# Patient Record
Sex: Female | Born: 1980
Health system: Southern US, Community
[De-identification: ages and names within clinical notes are randomized; demographics above are authoritative.]

## PROBLEM LIST (undated history)

## (undated) DIAGNOSIS — D509 Iron deficiency anemia, unspecified: Secondary | ICD-10-CM

## (undated) DIAGNOSIS — D649 Anemia, unspecified: Secondary | ICD-10-CM

## (undated) DIAGNOSIS — E559 Vitamin D deficiency, unspecified: Secondary | ICD-10-CM

## (undated) HISTORY — DX: Vitamin D deficiency, unspecified: E55.9

## (undated) HISTORY — DX: Iron deficiency anemia, unspecified: D50.9

## (undated) HISTORY — PX: ESSURE TUBAL LIGATION: SUR464

---

## 2004-03-10 ENCOUNTER — Emergency Department: Payer: Self-pay | Admitting: Emergency Medicine

## 2004-03-13 ENCOUNTER — Emergency Department: Payer: Self-pay | Admitting: Emergency Medicine

## 2004-05-06 ENCOUNTER — Observation Stay: Payer: Self-pay | Admitting: Obstetrics and Gynecology

## 2004-05-30 ENCOUNTER — Observation Stay: Payer: Self-pay

## 2004-06-17 ENCOUNTER — Observation Stay: Payer: Self-pay | Admitting: Obstetrics and Gynecology

## 2004-06-28 ENCOUNTER — Inpatient Hospital Stay: Payer: Self-pay

## 2004-08-23 ENCOUNTER — Emergency Department: Payer: Self-pay | Admitting: Emergency Medicine

## 2005-01-02 ENCOUNTER — Emergency Department: Payer: Self-pay | Admitting: Emergency Medicine

## 2005-03-14 ENCOUNTER — Emergency Department: Payer: Self-pay | Admitting: Unknown Physician Specialty

## 2005-11-13 ENCOUNTER — Emergency Department: Payer: Self-pay | Admitting: Emergency Medicine

## 2005-11-20 ENCOUNTER — Emergency Department: Payer: Self-pay | Admitting: General Practice

## 2005-11-22 ENCOUNTER — Emergency Department: Payer: Self-pay | Admitting: Emergency Medicine

## 2005-11-30 ENCOUNTER — Emergency Department: Payer: Self-pay | Admitting: Emergency Medicine

## 2005-12-06 ENCOUNTER — Inpatient Hospital Stay: Payer: Self-pay

## 2006-01-05 ENCOUNTER — Emergency Department: Payer: Self-pay | Admitting: Emergency Medicine

## 2006-04-04 ENCOUNTER — Observation Stay: Payer: Self-pay | Admitting: Unknown Physician Specialty

## 2006-05-15 ENCOUNTER — Observation Stay: Payer: Self-pay

## 2006-05-27 ENCOUNTER — Observation Stay: Payer: Self-pay

## 2006-05-30 ENCOUNTER — Observation Stay: Payer: Self-pay | Admitting: Obstetrics & Gynecology

## 2006-06-08 ENCOUNTER — Observation Stay: Payer: Self-pay

## 2006-06-16 ENCOUNTER — Inpatient Hospital Stay: Payer: Self-pay | Admitting: Obstetrics & Gynecology

## 2007-05-24 ENCOUNTER — Emergency Department: Payer: Self-pay | Admitting: Internal Medicine

## 2007-05-28 ENCOUNTER — Emergency Department: Payer: Self-pay | Admitting: Emergency Medicine

## 2008-01-08 ENCOUNTER — Emergency Department: Payer: Self-pay | Admitting: Emergency Medicine

## 2008-01-08 ENCOUNTER — Other Ambulatory Visit: Payer: Self-pay

## 2008-03-03 ENCOUNTER — Ambulatory Visit: Payer: Self-pay | Admitting: Advanced Practice Midwife

## 2008-03-26 ENCOUNTER — Observation Stay: Payer: Self-pay

## 2008-05-08 ENCOUNTER — Observation Stay: Payer: Self-pay | Admitting: Obstetrics & Gynecology

## 2008-06-02 ENCOUNTER — Observation Stay: Payer: Self-pay | Admitting: Obstetrics and Gynecology

## 2008-06-03 ENCOUNTER — Ambulatory Visit: Payer: Self-pay

## 2008-06-14 ENCOUNTER — Observation Stay: Payer: Self-pay | Admitting: Obstetrics & Gynecology

## 2008-06-16 ENCOUNTER — Observation Stay: Payer: Self-pay

## 2008-06-21 ENCOUNTER — Observation Stay: Payer: Self-pay | Admitting: Unknown Physician Specialty

## 2008-07-14 ENCOUNTER — Observation Stay: Payer: Self-pay

## 2008-07-18 ENCOUNTER — Observation Stay: Payer: Self-pay

## 2008-07-30 ENCOUNTER — Inpatient Hospital Stay: Payer: Self-pay

## 2009-02-10 ENCOUNTER — Ambulatory Visit: Payer: Self-pay | Admitting: Obstetrics & Gynecology

## 2009-08-11 IMAGING — US US OB US >=[ID] SNGL FETUS
1 series · 17 of 28 positions shown · non-contrast
Comparison: none

REASON FOR EXAM: SIZE AND DATES
COMMENTS:

[Series 1: us ob us >=(id) sngl fetus · 17 of 49 slices shown]
[im 1/49]
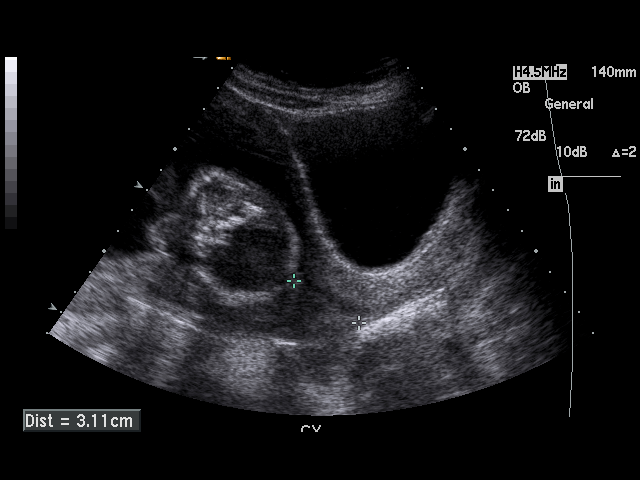
[im 4/49]
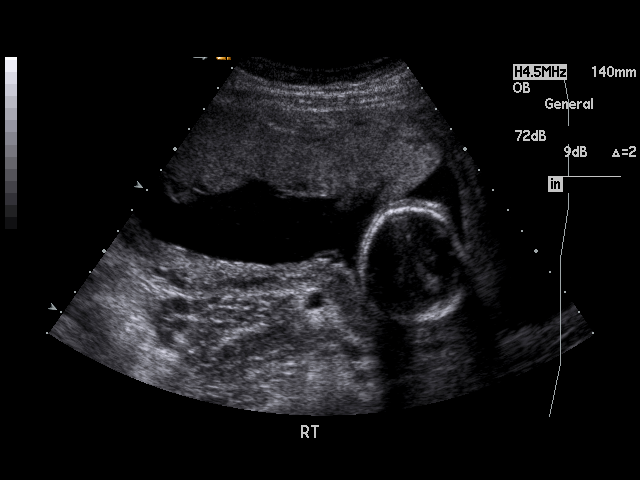
[im 8/49]
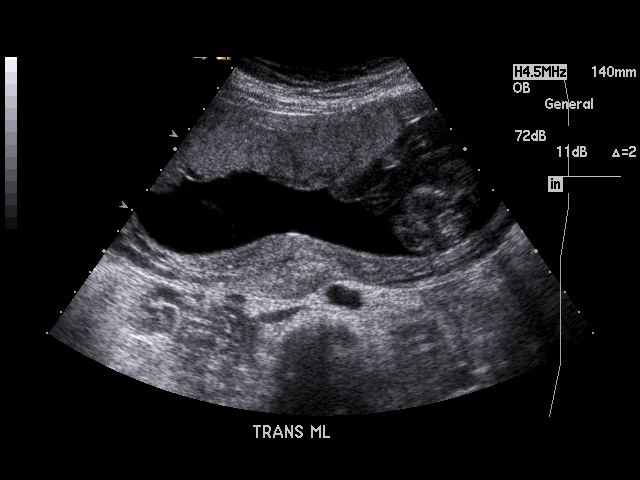
[im 9/49]
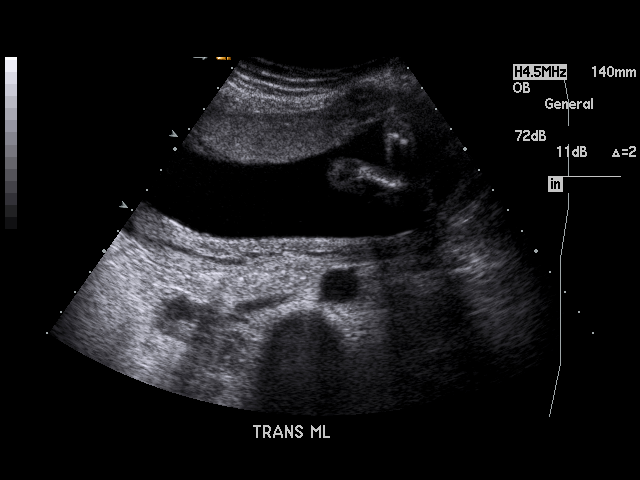
[im 13/49]
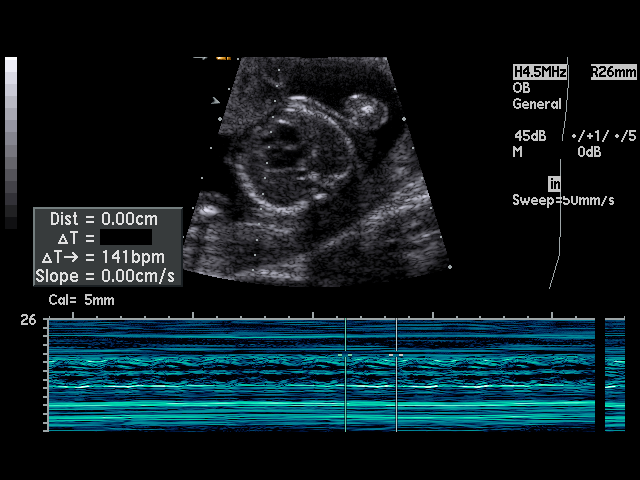
[im 17/49]
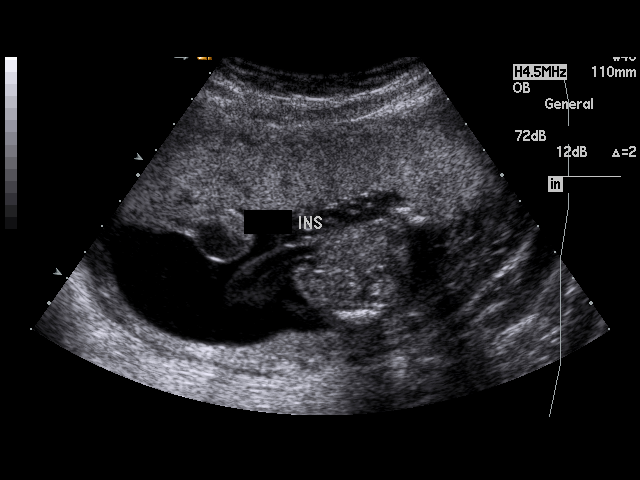
[im 18/49]
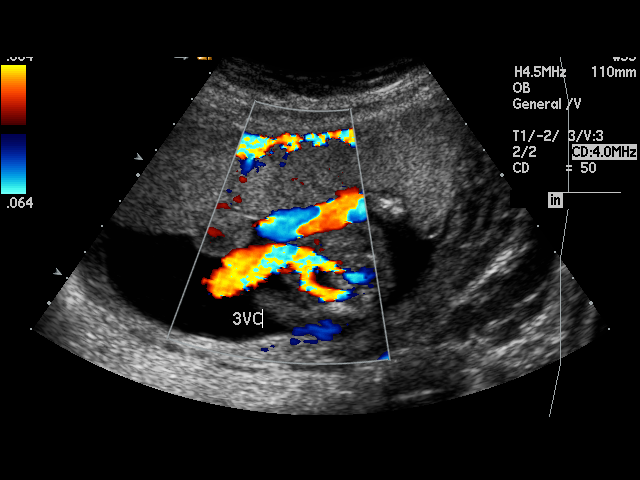
[im 22/49]
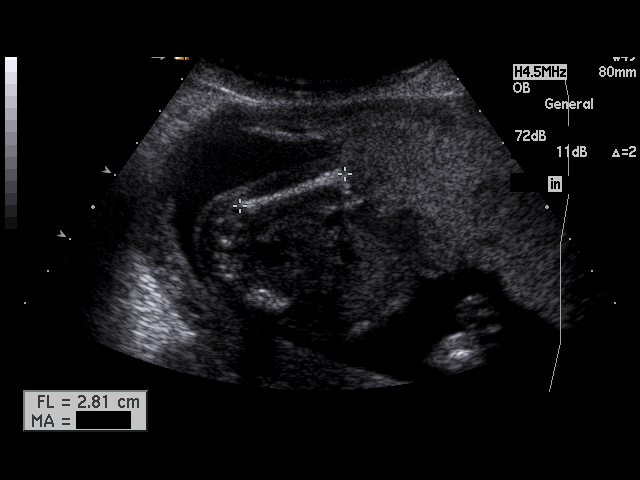
[im 25/49]
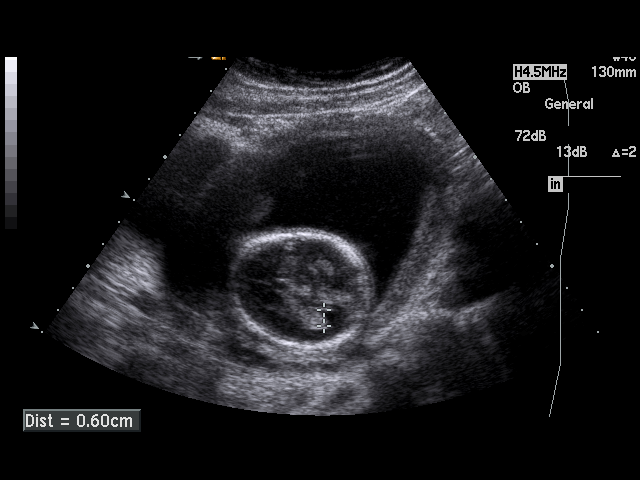
[im 27/49]
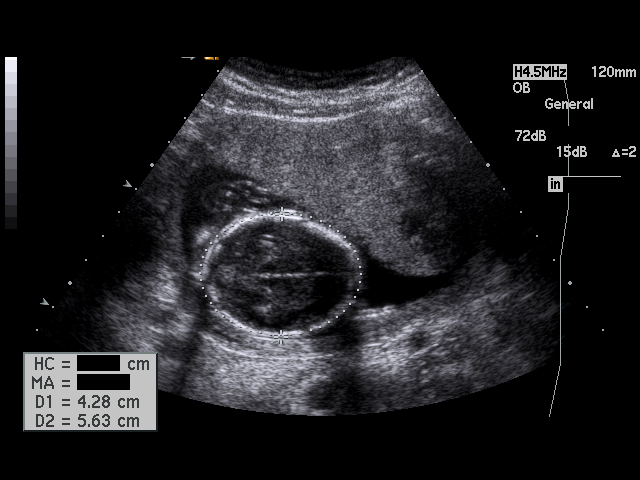
[im 31/49]
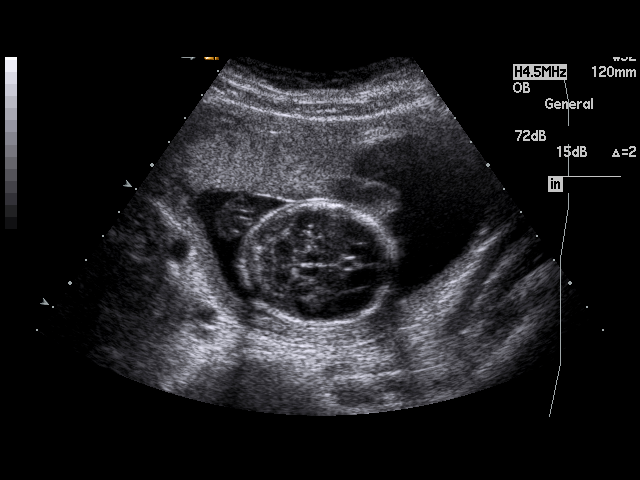
[im 33/49]
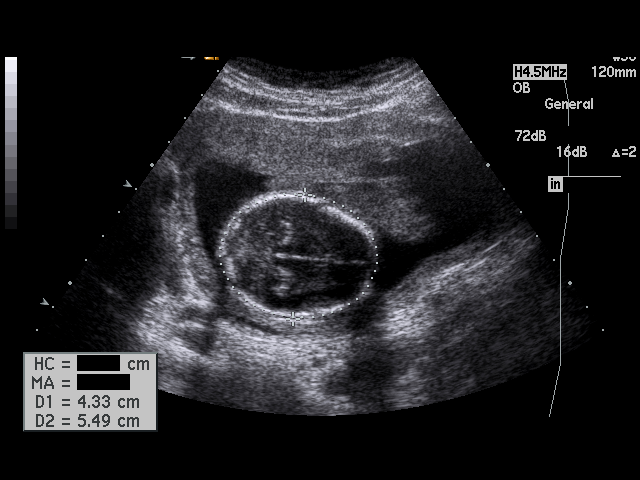
[im 36/49]
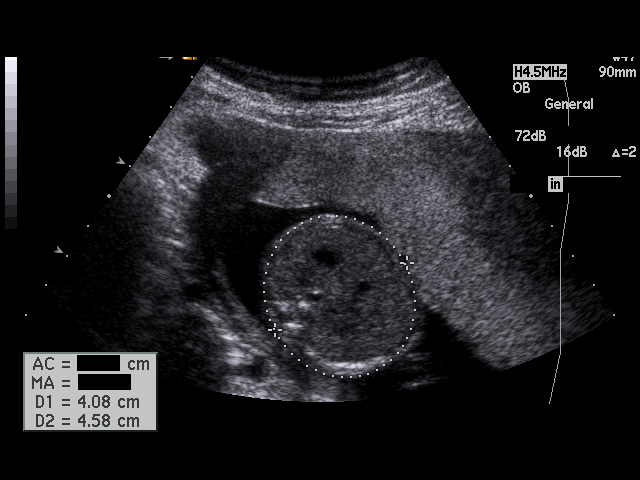
[im 40/49]
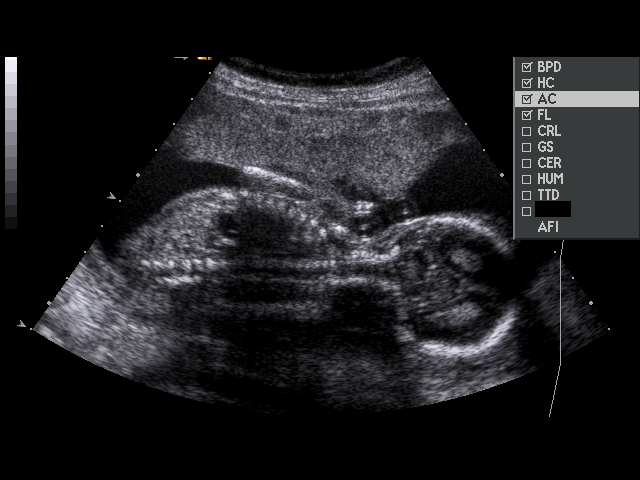
[im 41/49]
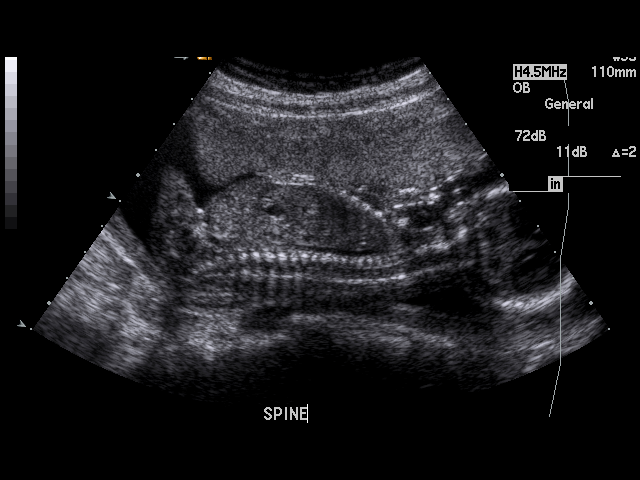
[im 45/49]
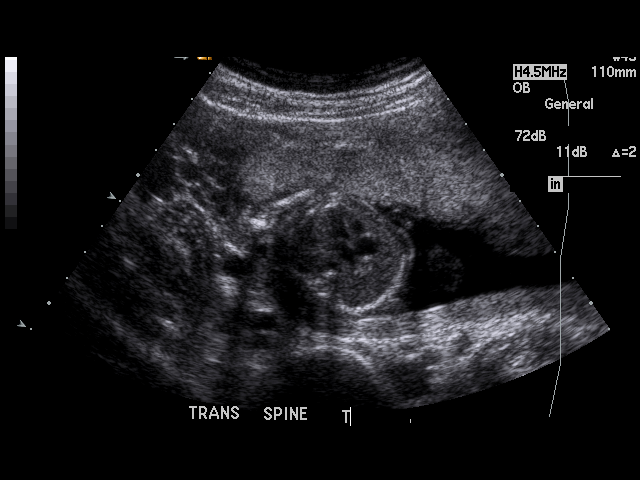
[im 49/49]
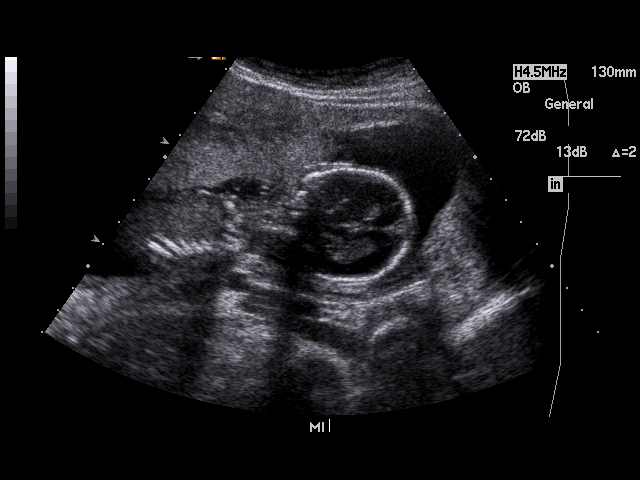

[17 of 28 positions shown; findings below may reference images not displayed]

PROCEDURE:     US  - US OB GREATER/OR EQUAL TO IB0CI  - March 03, 2008 [DATE]

RESULT:     Ultrasound of the pelvis is performed utilizing a routine OB
protocol. The study demonstrates a single intrauterine fetus with normal
cardiac, trunk, extremity and diaphragmatic motion. The amniotic fluid
volume is visually normal. The placenta is anterior in position and grade 1
in appearance. The fetal anatomy shows no gross abnormality although
visualization of the spine is somewhat limited. Gestational age based on
sonographic criteria is approximately 18 weeks-5 days with an estimated
delivery date of 07/30/2008. Estimated fetal weight is 283 grams + / - 38 grams.

The fetal cardiac activity is measured at approximately 141 beats per minute.
IMPRESSION: Single intrauterine gestation of approximately 18 weeks-5
days.

## 2010-09-20 ENCOUNTER — Emergency Department: Payer: Self-pay | Admitting: Emergency Medicine

## 2012-07-14 ENCOUNTER — Emergency Department: Payer: Self-pay | Admitting: Emergency Medicine

## 2012-07-15 LAB — URINALYSIS, COMPLETE
Bilirubin,UR: NEGATIVE
Blood: NEGATIVE
Glucose,UR: NEGATIVE mg/dL (ref 0–75)
Ph: 7 (ref 4.5–8.0)
Protein: 30
Squamous Epithelial: 2
WBC UR: 6 /HPF (ref 0–5)

## 2012-07-15 LAB — CBC
MCH: 21.2 pg — ABNORMAL LOW (ref 26.0–34.0)
MCHC: 31.4 g/dL — ABNORMAL LOW (ref 32.0–36.0)
MCV: 68 fL — ABNORMAL LOW (ref 80–100)
RBC: 5.71 10*6/uL — ABNORMAL HIGH (ref 3.80–5.20)
WBC: 9.2 10*3/uL (ref 3.6–11.0)

## 2012-07-15 LAB — COMPREHENSIVE METABOLIC PANEL
Alkaline Phosphatase: 54 U/L (ref 50–136)
Bilirubin,Total: 0.4 mg/dL (ref 0.2–1.0)
Chloride: 107 mmol/L (ref 98–107)
Co2: 25 mmol/L (ref 21–32)
Osmolality: 275 (ref 275–301)
Potassium: 4 mmol/L (ref 3.5–5.1)
SGOT(AST): 16 U/L (ref 15–37)
Total Protein: 8.7 g/dL — ABNORMAL HIGH (ref 6.4–8.2)

## 2012-07-15 LAB — RAPID INFLUENZA A&B ANTIGENS

## 2013-08-09 ENCOUNTER — Emergency Department: Payer: Self-pay | Admitting: Emergency Medicine

## 2013-08-09 LAB — CBC
HCT: 31.6 % — ABNORMAL LOW (ref 35.0–47.0)
HGB: 9.8 g/dL — AB (ref 12.0–16.0)
MCH: 18.5 pg — ABNORMAL LOW (ref 26.0–34.0)
MCHC: 31 g/dL — ABNORMAL LOW (ref 32.0–36.0)
MCV: 60 fL — ABNORMAL LOW (ref 80–100)
Platelet: 227 10*3/uL (ref 150–440)
RBC: 5.29 10*6/uL — ABNORMAL HIGH (ref 3.80–5.20)
RDW: 20.5 % — ABNORMAL HIGH (ref 11.5–14.5)
WBC: 7.1 10*3/uL (ref 3.6–11.0)

## 2013-08-09 LAB — URINALYSIS, COMPLETE
Bacteria: NONE SEEN
Bilirubin,UR: NEGATIVE
Glucose,UR: NEGATIVE mg/dL (ref 0–75)
KETONE: NEGATIVE
LEUKOCYTE ESTERASE: NEGATIVE
Nitrite: NEGATIVE
PROTEIN: NEGATIVE
Ph: 6 (ref 4.5–8.0)
RBC,UR: 56 /HPF (ref 0–5)
Specific Gravity: 1.027 (ref 1.003–1.030)

## 2013-08-09 LAB — GC/CHLAMYDIA PROBE AMP

## 2013-08-09 LAB — HCG, QUANTITATIVE, PREGNANCY: Beta Hcg, Quant.: 3874 m[IU]/mL — ABNORMAL HIGH

## 2013-08-09 LAB — WET PREP, GENITAL

## 2013-10-06 ENCOUNTER — Emergency Department: Payer: Self-pay | Admitting: Emergency Medicine

## 2013-10-06 LAB — URINALYSIS, COMPLETE
BILIRUBIN, UR: NEGATIVE
BLOOD: NEGATIVE
Bacteria: NONE SEEN
GLUCOSE, UR: NEGATIVE mg/dL (ref 0–75)
KETONE: NEGATIVE
Leukocyte Esterase: NEGATIVE
NITRITE: POSITIVE
PROTEIN: NEGATIVE
Ph: 6 (ref 4.5–8.0)
RBC,UR: NONE SEEN /HPF (ref 0–5)
SPECIFIC GRAVITY: 1.018 (ref 1.003–1.030)
Squamous Epithelial: 2

## 2013-10-06 LAB — COMPREHENSIVE METABOLIC PANEL
ALBUMIN: 2.9 g/dL — AB (ref 3.4–5.0)
ALK PHOS: 47 U/L
ALT: 11 U/L — AB (ref 12–78)
Anion Gap: 7 (ref 7–16)
BILIRUBIN TOTAL: 0.1 mg/dL — AB (ref 0.2–1.0)
BUN: 7 mg/dL (ref 7–18)
CALCIUM: 8.3 mg/dL — AB (ref 8.5–10.1)
CHLORIDE: 104 mmol/L (ref 98–107)
Co2: 24 mmol/L (ref 21–32)
Creatinine: 0.51 mg/dL — ABNORMAL LOW (ref 0.60–1.30)
EGFR (African American): >60
Glucose: 94 mg/dL (ref 65–99)
Osmolality: 268 (ref 275–301)
Potassium: 3.9 mmol/L (ref 3.5–5.1)
SGOT(AST): 12 U/L — ABNORMAL LOW (ref 15–37)
Sodium: 135 mmol/L — ABNORMAL LOW (ref 136–145)
Total Protein: 7.8 g/dL (ref 6.4–8.2)

## 2013-10-06 LAB — CBC WITH DIFFERENTIAL/PLATELET
Basophil #: 0 10*3/uL (ref 0.0–0.1)
Basophil %: 0.2 %
Eosinophil #: 0.1 10*3/uL (ref 0.0–0.7)
Eosinophil %: 1.2 %
HCT: 30.9 % — ABNORMAL LOW (ref 35.0–47.0)
HGB: 9.5 g/dL — ABNORMAL LOW (ref 12.0–16.0)
Lymphocyte #: 2.4 10*3/uL (ref 1.0–3.6)
Lymphocyte %: 27.2 %
MCH: 19.5 pg — ABNORMAL LOW (ref 26.0–34.0)
MCHC: 30.8 g/dL — ABNORMAL LOW (ref 32.0–36.0)
MCV: 63 fL — AB (ref 80–100)
MONO ABS: 0.5 x10 3/mm (ref 0.2–0.9)
Monocyte %: 5.4 %
Neutrophil #: 5.7 10*3/uL (ref 1.4–6.5)
Neutrophil %: 66 %
Platelet: 149 10*3/uL — ABNORMAL LOW (ref 150–440)
RBC: 4.89 10*6/uL (ref 3.80–5.20)
RDW: 21.7 % — ABNORMAL HIGH (ref 11.5–14.5)
WBC: 8.6 10*3/uL (ref 3.6–11.0)

## 2014-03-04 ENCOUNTER — Observation Stay: Payer: Self-pay | Admitting: Obstetrics and Gynecology

## 2014-03-04 LAB — CBC WITH DIFFERENTIAL/PLATELET
Basophil #: 0 10*3/uL (ref 0.0–0.1)
Basophil %: 0.4 %
EOS PCT: 1.3 %
Eosinophil #: 0.1 10*3/uL (ref 0.0–0.7)
HCT: 29.7 % — AB (ref 35.0–47.0)
HGB: 9.2 g/dL — ABNORMAL LOW (ref 12.0–16.0)
LYMPHS PCT: 18.3 %
Lymphocyte #: 1.4 10*3/uL (ref 1.0–3.6)
MCH: 21.2 pg — AB (ref 26.0–34.0)
MCHC: 31 g/dL — AB (ref 32.0–36.0)
MCV: 68 fL — ABNORMAL LOW (ref 80–100)
Monocyte #: 0.5 x10 3/mm (ref 0.2–0.9)
Monocyte %: 7.1 %
NEUTROS PCT: 72.9 %
Neutrophil #: 5.5 10*3/uL (ref 1.4–6.5)
Platelet: 131 10*3/uL — ABNORMAL LOW (ref 150–440)
RBC: 4.35 10*6/uL (ref 3.80–5.20)
RDW: 19.2 % — ABNORMAL HIGH (ref 11.5–14.5)
WBC: 7.5 10*3/uL (ref 3.6–11.0)

## 2014-04-04 ENCOUNTER — Observation Stay: Payer: Self-pay

## 2014-04-05 ENCOUNTER — Observation Stay: Payer: Self-pay | Admitting: *Deleted

## 2014-04-08 ENCOUNTER — Observation Stay: Payer: Self-pay | Admitting: Obstetrics and Gynecology

## 2014-04-13 ENCOUNTER — Inpatient Hospital Stay: Payer: Self-pay

## 2014-04-13 LAB — CBC WITH DIFFERENTIAL/PLATELET
BASOS ABS: 0 10*3/uL (ref 0.0–0.1)
Basophil %: 0.4 %
Eosinophil #: 0.1 10*3/uL (ref 0.0–0.7)
Eosinophil %: 0.9 %
HCT: 32 % — ABNORMAL LOW (ref 35.0–47.0)
HGB: 10.2 g/dL — AB (ref 12.0–16.0)
Lymphocyte #: 1.4 10*3/uL (ref 1.0–3.6)
Lymphocyte %: 22.1 %
MCH: 22 pg — ABNORMAL LOW (ref 26.0–34.0)
MCHC: 31.7 g/dL — ABNORMAL LOW (ref 32.0–36.0)
MCV: 69 fL — ABNORMAL LOW (ref 80–100)
MONO ABS: 0.4 x10 3/mm (ref 0.2–0.9)
MONOS PCT: 6.3 %
Neutrophil #: 4.4 10*3/uL (ref 1.4–6.5)
Neutrophil %: 70.3 %
Platelet: 134 10*3/uL — ABNORMAL LOW (ref 150–440)
RBC: 4.61 10*6/uL (ref 3.80–5.20)
RDW: 20.5 % — ABNORMAL HIGH (ref 11.5–14.5)
WBC: 6.2 10*3/uL (ref 3.6–11.0)

## 2014-04-15 LAB — HEMATOCRIT: HCT: 24 % — AB (ref 35.0–47.0)

## 2014-07-23 ENCOUNTER — Emergency Department: Payer: Self-pay | Admitting: Emergency Medicine

## 2014-10-04 NOTE — H&P (Signed)
L&D Evaluation:  History Expanded:  HPI Pt is a 34 yo [id number] at 39+ weeks GA with an EDC of 04/09/14 who presents to L&D with reports of contractions that have been on and off all day long. She reports +FM, denies vb or lof. Her history is significant for anemia, AMA< smoker, thrombocytopenia which is stable, depression, essure on the left tube only due to right sided blockage, and a shoulder dystocia with her last baby that required no maneuvers for a 8#4oz baby. She is B+, RI, VI, GBS positive. She would like a BTL PP.   Presents with contractions   Patient's Medical History anemia, depression   Patient's Surgical History essure placement,   Medications Pre Natal Vitamins  Iron   Allergies NKDA   Social History tobacco   Family History Non-Contributory   ROS:  ROS All systems were reviewed.  HEENT, CNS, GI, GU, Respiratory, CV, Renal and Musculoskeletal systems were found to be normal.   Exam:  Vital Signs stable   General no apparent distress   Mental Status clear   Abdomen gravid, tender with contractions   Pelvic no external lesions, 3/80/-1   Mebranes Intact   FHT normal rate with no decels, 145, moderate variability, +accels,  no decels Cat 1 FHT   Ucx irregular, 9-17 minutes   Skin dry, no lesions, no rashes   Lymph no lymphadenopathy  other   Impression:  Impression reactive NST, IUP at 39+, cat 1 FHT, possible prodromal labor, NO CHANGE FROM YESTERDAYS ADMISSION   Plan:  Plan EFM/NST, monitor contractions and for cervical change, fluids, discharge   Comments rest   Follow Up Appointment already scheduled   Electronic Signatures: Letitia LibraHarris, Jamin Humphries Paul (MD)  (Signed 10-Nov-15 17:16)  Authored: L&D Evaluation   Last Updated: 10-Nov-15 17:16 by Letitia LibraHarris, Chett Taniguchi Paul (MD)

## 2014-10-04 NOTE — H&P (Signed)
L&D Evaluation:  History:  HPI Pt is a 34 yo [id number] at 39.[redacted] weeks GA with an EDC of 04/09/14 who presents to L&D with reports of contractions that have been on and off all day long. She reports +FM, denies vb or lof. Her history is significant for anemia, AMA< smoker, thrombocytopenia which is stable, depression, essure on the left tube only due to right sided blockage, and a shoulder dystocia with her last baby that required no maneuvers for a 8#4oz baby. She is B+, RI, VI, GBS positive. She would like a BTL PP.   Presents with contractions   Patient's Medical History anemia, depression   Patient's Surgical History essure placement,   Medications Pre Natal Vitamins  Iron   Allergies NKDA   Social History tobacco   Family History Non-Contributory   ROS:  ROS All systems were reviewed.  HEENT, CNS, GI, GU, Respiratory, CV, Renal and Musculoskeletal systems were found to be normal.   Exam:  Vital Signs stable   General no apparent distress   Mental Status clear   Abdomen gravid, tender with contractions   Pelvic no external lesions, 3/80/-1   Mebranes Intact   FHT normal rate with no decels, 145, moderate variability, +accels,  no decels Cat 1 FHT   Ucx irregular, 9-17 minutes   Skin dry, no lesions, no rashes   Lymph no lymphadenopathy  other   Impression:  Impression reactive NST, IUP at 39.2, cat 1 FHT, possible prodromal labor   Plan:  Plan discharge, pt offered therapeutic rest for contractions and accepts. Discussed FKC and labor precautions.   Follow Up Appointment already scheduled   Electronic Signatures: Jannet MantisSubudhi, Sydney Azure (CNM)  (Signed [phone]-Nov-15 23:16)  Authored: L&D Evaluation   Last Updated: 09-Nov-15 23:16 by Jannet MantisSubudhi, Sonal Dorwart (CNM)

## 2014-10-04 NOTE — H&P (Signed)
L&D Evaluation:  History:  HPI 34 year old [id number] [facility]72[facility]d [facility] after near syncopal event.  Patient was sitting and stood up quickly when she began feeling dizzy, diaphorectic, and felt her heart racing.  Symptoms resolved after sitting back down.  +FM, no LOF, no VB, no ctx.  Patient was seen yesterday in clinic and has reactive NST/AFI 7cm.  Patient anemic going into pregnancy, 8.8 at 28 week labs, taking prenatal gummies and OTC iron   Presents with dizzyness   Patient's Medical History anemia, thrombocytopenia, depression   Patient's Surgical History essure   Medications Pre Natal Vitamins  Iron   Allergies NKDA   Social History none   Family History Non-Contributory   ROS:  ROS All systems were reviewed.  HEENT, CNS, GI, GU, Respiratory, CV, Renal and Musculoskeletal systems were found to be normal.   Exam:  Vital Signs stable  106/63,   Urine Protein not completed   General no apparent distress   Mental Status clear   Abdomen gravid, non-tender   Estimated Fetal Weight Average for gestational age   Fetal Position vtx   Edema no edema   FHT normal rate with no decels, 140, moderate, positive accels, no decels   Ucx absent   Impression:  Impression 34 yo [id number] at 6472w5d with vasovagal episode   Plan:  Comments 1) Vasovagal episode - discussed etiology and precipitating events.  Given Rx ferrous sulfate 325mg  po bid.  Also given knee high ted hose - repeat CBC patient discharged prior to results has prior engagement told ok given has follow up in place 10/14  2) Fetus category I tracing  3) Disposition - discharge home   Follow Up Appointment already scheduled   Electronic Signatures: Lorrene ReidStaebler, Aveer Bartow M (MD)  (Signed 09-Oct-15 17:01)  Authored: L&D Evaluation   Last Updated: 09-Oct-15 17:01 by Lorrene ReidStaebler, Ivon Roedel M (MD)

## 2015-05-22 ENCOUNTER — Encounter (HOSPITAL_COMMUNITY): Payer: Self-pay | Admitting: *Deleted

## 2015-05-22 ENCOUNTER — Emergency Department (HOSPITAL_COMMUNITY): Payer: BLUE CROSS/BLUE SHIELD

## 2015-05-22 ENCOUNTER — Emergency Department (HOSPITAL_COMMUNITY)
Admission: EM | Admit: 2015-05-22 | Discharge: 2015-05-22 | Disposition: A | Discharge status: Home / Self Care | Payer: BLUE CROSS/BLUE SHIELD | Attending: Emergency Medicine | Admitting: Emergency Medicine

## 2015-05-22 DIAGNOSIS — R091 Pleurisy: Secondary | ICD-10-CM | POA: Insufficient documentation

## 2015-05-22 DIAGNOSIS — R079 Chest pain, unspecified: Secondary | ICD-10-CM | POA: Diagnosis present

## 2015-05-22 LAB — BASIC METABOLIC PANEL
Anion gap: 6 (ref 5–15)
BUN: 10 mg/dL (ref 6–20)
CALCIUM: 8.9 mg/dL (ref 8.9–10.3)
CHLORIDE: 110 mmol/L (ref 101–111)
CO2: 24 mmol/L (ref 22–32)
CREATININE: 0.68 mg/dL (ref 0.44–1.00)
GFR calc Af Amer: >60 mL/min (ref >60)
GFR calc non Af Amer: >60 mL/min (ref >60)
GLUCOSE: 125 mg/dL — AB (ref 65–99)
Potassium: 3.7 mmol/L (ref 3.5–5.1)
Sodium: 140 mmol/L (ref 135–145)

## 2015-05-22 LAB — CBC
HCT: 32.9 % — ABNORMAL LOW (ref 36.0–46.0)
HEMOGLOBIN: 9.8 g/dL — AB (ref 12.0–15.0)
MCH: 19.8 pg — ABNORMAL LOW (ref 26.0–34.0)
MCHC: 29.8 g/dL — AB (ref 30.0–36.0)
MCV: 66.5 fL — ABNORMAL LOW (ref 78.0–100.0)
Platelets: 216 10*3/uL (ref 150–400)
RBC: 4.95 MIL/uL (ref 3.87–5.11)
RDW: 15.9 % — AB (ref 11.5–15.5)
WBC: 5.7 10*3/uL (ref 4.0–10.5)

## 2015-05-22 LAB — I-STAT TROPONIN, ED: Troponin i, poc: 0 ng/mL (ref 0.00–0.08)

## 2015-05-22 MED ORDER — IBUPROFEN 800 MG PO TABS: 800.0000 mg | ORAL_TABLET | Freq: Three times a day (TID) | ORAL | Status: DC

## 2015-05-22 NOTE — ED Notes (Signed)
Pt reports chest pain with deep breathing. Pt states hat she has recently had cough/congestion.

## 2015-05-22 NOTE — ED Provider Notes (Signed)
CSN: [id number]     Arrival date & time 05/22/15  1439 History   First MD Initiated Contact with Patient 05/22/15 2011     Chief Complaint  Patient presents with  . Chest Pain     (Consider location/radiation/quality/duration/timing/severity/associated sxs/prior Treatment) HPI  34 y/o female - recent upper respiratory infection, no other significant chronic past medical problems, states that she has had several days of pain in the chest with deep breathing, only an occasional cough and no fevers. She has no swelling of the legs, she has a child that was just diagnosed with pneumonia, she is worried that she has the same.  History reviewed. No pertinent past medical history. Past Surgical History  Procedure Laterality Date  . Essure tubal ligation     No family history on file. Social History  Substance Use Topics  . Smoking status: Never Smoker   . Smokeless tobacco: None  . Alcohol Use: None   OB History    No data available     Review of Systems  All other systems reviewed and are negative.     Allergies  Review of patient's allergies indicates no known allergies.  Home Medications   Prior to Admission medications   Medication Sig Start Date End Date Taking? Authorizing Provider  ibuprofen (ADVIL,MOTRIN) 800 MG tablet Take 1 tablet (800 mg total) by mouth 3 (three) times daily. 05/22/15   Eber Hong, MD   BP 119/67 mmHg  Pulse 85  Temp(Src) 98.4 F (36.9 C) (Oral)  Resp 20  SpO2 100%  LMP 05/01/2015 Physical Exam  Constitutional: She appears well-developed and well-nourished. No distress.  HENT:  Head: Normocephalic and atraumatic.  Mouth/Throat: Oropharynx is clear and moist. No oropharyngeal exudate.  Eyes: Conjunctivae and EOM are normal. Pupils are equal, round, and reactive to light. Right eye exhibits no discharge. Left eye exhibits no discharge. No scleral icterus.  Neck: Normal range of motion. Neck supple. No JVD present. No thyromegaly present.   Cardiovascular: Normal rate, regular rhythm, normal heart sounds and intact distal pulses.  Exam reveals no gallop and no friction rub.   No murmur heard. Pulmonary/Chest: Effort normal and breath sounds normal. No respiratory distress. She has no wheezes. She has no rales.  Lungs sounds normal, speaks in full sentences, no distress or increased work of breathing  Abdominal: Soft. Bowel sounds are normal. She exhibits no distension and no mass. There is no tenderness.  Musculoskeletal: Normal range of motion. She exhibits no edema or tenderness.  Lymphadenopathy:    She has no cervical adenopathy.  Neurological: She is alert. Coordination normal.  Skin: Skin is warm and dry. No rash noted. No erythema.  Psychiatric: She has a normal mood and affect. Her behavior is normal.  Nursing note and vitals reviewed.   ED Course  Procedures (including critical care time) Labs Review Labs Reviewed  BASIC METABOLIC PANEL - Abnormal; Notable for the following:    Glucose, Bld 125 (*)    All other components within normal limits  CBC - Abnormal; Notable for the following:    Hemoglobin 9.8 (*)    HCT 32.9 (*)    MCV 66.5 (*)    MCH 19.8 (*)    MCHC 29.8 (*)    RDW 15.9 (*)    All other components within normal limits  Rosezena Sensor, ED    Imaging Review Dg Chest 2 View  05/22/2015  CLINICAL DATA:  34 year old female with chest pain on deep inspiration EXAM: CHEST  2 VIEW COMPARISON:  None. FINDINGS: The lungs are clear and negative for focal airspace consolidation, pulmonary edema or suspicious pulmonary nodule. No pleural effusion or pneumothorax. Cardiac and mediastinal contours are within normal limits. No acute fracture or lytic or blastic osseous lesions. The visualized upper abdominal bowel gas pattern is unremarkable. IMPRESSION: Normal chest x-ray. Electronically Signed   By: Malachy MoanHeath  McCullough M.D.   On: 05/22/2015 15:33   I have personally reviewed and evaluated these images and  lab results as part of my medical decision-making.   EKG Interpretation   Date/Time:  Monday May 22 2015 14:49:16 EST Ventricular Rate:  83 PR Interval:  130 QRS Duration: 86 QT Interval:  360 QTC Calculation: 423 R Axis:   72 Text Interpretation:  Normal sinus rhythm with sinus arrhythmia Normal ECG  since last tracing no significant change Confirmed by Aritzel Krusemark  MD, Cataleia Gade  [id number]) on 05/22/2015 8:16:25 PM      MDM   Final diagnoses:  Pleurisy    Vitals unremarkable, EKG normal, chest x-ray unremarkable as well, likely pleurisy, stable for discharge on anti-inflammatories. Patient informed of the results, she is in agreement.  Meds given in ED:  Medications - No data to display  New Prescriptions   IBUPROFEN (ADVIL,MOTRIN) 800 MG TABLET    Take 1 tablet (800 mg total) by mouth 3 (three) times daily.        Eber HongBrian Greyden Besecker, MD 05/22/15 2021

## 2016-01-12 DIAGNOSIS — Z13228 Encounter for screening for other metabolic disorders: Secondary | ICD-10-CM | POA: Diagnosis not present

## 2016-01-12 DIAGNOSIS — F329 Major depressive disorder, single episode, unspecified: Secondary | ICD-10-CM | POA: Diagnosis not present

## 2016-01-12 DIAGNOSIS — R5383 Other fatigue: Secondary | ICD-10-CM | POA: Diagnosis not present

## 2016-01-14 DIAGNOSIS — D509 Iron deficiency anemia, unspecified: Secondary | ICD-10-CM | POA: Insufficient documentation

## 2016-01-14 DIAGNOSIS — E559 Vitamin D deficiency, unspecified: Secondary | ICD-10-CM

## 2016-01-14 HISTORY — DX: Iron deficiency anemia, unspecified: D50.9

## 2016-01-14 HISTORY — DX: Vitamin D deficiency, unspecified: E55.9

## 2016-02-05 DIAGNOSIS — Z6827 Body mass index (BMI) 27.0-27.9, adult: Secondary | ICD-10-CM | POA: Diagnosis not present

## 2016-02-05 DIAGNOSIS — Z01419 Encounter for gynecological examination (general) (routine) without abnormal findings: Secondary | ICD-10-CM | POA: Diagnosis not present

## 2016-02-05 DIAGNOSIS — N760 Acute vaginitis: Secondary | ICD-10-CM | POA: Diagnosis not present

## 2016-02-05 DIAGNOSIS — N920 Excessive and frequent menstruation with regular cycle: Secondary | ICD-10-CM | POA: Diagnosis not present

## 2016-02-05 DIAGNOSIS — Z113 Encounter for screening for infections with a predominantly sexual mode of transmission: Secondary | ICD-10-CM | POA: Diagnosis not present

## 2016-02-05 DIAGNOSIS — Z1151 Encounter for screening for human papillomavirus (HPV): Secondary | ICD-10-CM | POA: Diagnosis not present

## 2016-02-27 DIAGNOSIS — N921 Excessive and frequent menstruation with irregular cycle: Secondary | ICD-10-CM | POA: Diagnosis not present

## 2016-03-01 DIAGNOSIS — F418 Other specified anxiety disorders: Secondary | ICD-10-CM | POA: Diagnosis not present

## 2016-08-27 DIAGNOSIS — M542 Cervicalgia: Secondary | ICD-10-CM | POA: Diagnosis not present

## 2016-09-16 DIAGNOSIS — E559 Vitamin D deficiency, unspecified: Secondary | ICD-10-CM | POA: Diagnosis not present

## 2016-09-16 DIAGNOSIS — D509 Iron deficiency anemia, unspecified: Secondary | ICD-10-CM | POA: Diagnosis not present

## 2016-09-16 DIAGNOSIS — F418 Other specified anxiety disorders: Secondary | ICD-10-CM | POA: Diagnosis not present

## 2016-09-17 DIAGNOSIS — E559 Vitamin D deficiency, unspecified: Secondary | ICD-10-CM | POA: Diagnosis not present

## 2016-09-17 DIAGNOSIS — D509 Iron deficiency anemia, unspecified: Secondary | ICD-10-CM | POA: Diagnosis not present

## 2016-11-14 DIAGNOSIS — M542 Cervicalgia: Secondary | ICD-10-CM | POA: Diagnosis not present

## 2017-01-14 DIAGNOSIS — S139XXA Sprain of joints and ligaments of unspecified parts of neck, initial encounter: Secondary | ICD-10-CM | POA: Diagnosis not present

## 2017-04-04 DIAGNOSIS — N83201 Unspecified ovarian cyst, right side: Secondary | ICD-10-CM | POA: Diagnosis not present

## 2017-04-04 DIAGNOSIS — Z3202 Encounter for pregnancy test, result negative: Secondary | ICD-10-CM | POA: Diagnosis not present

## 2017-04-04 DIAGNOSIS — Z3046 Encounter for surveillance of implantable subdermal contraceptive: Secondary | ICD-10-CM | POA: Diagnosis not present

## 2017-04-14 ENCOUNTER — Encounter (HOSPITAL_COMMUNITY): Payer: Self-pay | Admitting: Emergency Medicine

## 2017-04-14 ENCOUNTER — Ambulatory Visit (HOSPITAL_COMMUNITY)
Admission: EM | Admit: 2017-04-14 | Discharge: 2017-04-14 | Disposition: A | Discharge status: Home / Self Care | Payer: BLUE CROSS/BLUE SHIELD

## 2017-04-14 DIAGNOSIS — M7918 Myalgia, other site: Secondary | ICD-10-CM | POA: Diagnosis not present

## 2017-04-14 DIAGNOSIS — M791 Myalgia, unspecified site: Secondary | ICD-10-CM

## 2017-04-14 DIAGNOSIS — M25512 Pain in left shoulder: Secondary | ICD-10-CM

## 2017-04-14 DIAGNOSIS — M542 Cervicalgia: Secondary | ICD-10-CM

## 2017-04-14 HISTORY — DX: Anemia, unspecified: D64.9

## 2017-04-14 MED ORDER — CYCLOBENZAPRINE HCL 5 MG PO TABS: 5.0000 mg | ORAL_TABLET | Freq: Three times a day (TID) | ORAL | 0 refills | Status: DC | PRN

## 2017-04-14 MED ORDER — NAPROXEN 375 MG PO TABS: 375.0000 mg | ORAL_TABLET | Freq: Two times a day (BID) | ORAL | 0 refills | Status: DC

## 2017-04-14 MED ORDER — DICLOFENAC SODIUM 1 % TD GEL: 1.0000 "application " | Freq: Four times a day (QID) | TRANSDERMAL | 0 refills | Status: DC

## 2017-04-14 MED ORDER — KETOROLAC TROMETHAMINE 60 MG/2ML IM SOLN
45.0000 mg | Freq: Once | INTRAMUSCULAR | Status: AC
  Administered 2017-04-14: 45 mg via INTRAMUSCULAR

## 2017-04-14 MED ORDER — TRAMADOL HCL 50 MG PO TABS: 50.0000 mg | ORAL_TABLET | Freq: Four times a day (QID) | ORAL | 0 refills | Status: DC | PRN

## 2017-04-14 MED ORDER — KETOROLAC TROMETHAMINE 60 MG/2ML IM SOLN
INTRAMUSCULAR | Status: AC
  Filled 2017-04-14: qty 2

## 2017-04-14 NOTE — Discharge Instructions (Signed)
Heat. Use the diclofenac gel 4 times a day. In a couple days start performing stretches as directed. Take the medications also as directed. The muscle relaxant and tramadol can cause some sedation or drowsiness.

## 2017-04-14 NOTE — ED Triage Notes (Signed)
PT reports stiff neck and left shoulder pain that started at 1130. PT reports pain moved to right side. PT reports she was walking around her office when it occurred. PT reports this is recurrent.

## 2017-04-14 NOTE — ED Provider Notes (Signed)
[facility]    CSN: [id number] Arrival date & time: 04/14/17  1709     History   Chief Complaint Chief Complaint  Patient presents with  . Shoulder Pain  . Torticollis    HPI Katelyn Soto is a 36 y.o. female.   36 year old female developed a rather sudden onset of pain in the muscles of the right neck, bilateral para thoracic muscle pain right greater than left and pain radiating aiding along the right trapezius ridge and deltoid. Denies any known trauma. 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      Past Medical History:  Diagnosis Date  . Anemia     There are no active problems to display for this patient.   Past Surgical History:  Procedure Laterality Date  . ESSURE TUBAL LIGATION      OB History    No data available       Home Medications    Prior to Admission medications   Medication Sig Start Date End Date Taking? Authorizing Provider  sertraline (ZOLOFT) 50 MG tablet Take 50 mg daily by mouth.   Yes [provider]  cyclobenzaprine (FLEXERIL) 5 MG tablet Take 1 tablet (5 mg total) 3 (three) times daily as needed by mouth for muscle spasms. 04/14/17   Hayden RasmussenMabe, Dejanay Wamboldt, NP  diclofenac sodium (VOLTAREN) 1 % GEL Apply 1 application 4 (four) times daily topically. 04/14/17   Hayden RasmussenMabe, Enedina Pair, NP  ibuprofen (ADVIL,MOTRIN) 800 MG tablet Take 1 tablet (800 mg total) by mouth 3 (three) times daily. 05/22/15   Eber HongMiller, Brian, MD  naproxen (NAPROSYN) 375 MG tablet Take 1 tablet (375 mg total) 2 (two) times daily by mouth. 04/14/17   Hayden RasmussenMabe, Ramandeep Arington, NP  traMADol (ULTRAM) 50 MG tablet Take 1 tablet (50 mg total) every 6 (six) hours as needed by mouth. 04/14/17   Hayden RasmussenMabe, Natsha Guidry, NP    Family History No family history on file.  Social History Social History   Tobacco Use  . Smoking status: Current Some Day Smoker    Types: Cigarettes  . Smokeless tobacco: Never Used  Substance Use Topics  . Alcohol use: Yes  . Drug  use: No     Allergies   Patient has no known allergies.   Review of Systems Review of Systems  Constitutional: Negative for activity change, chills and fever.  HENT: Negative.   Respiratory: Negative.   Cardiovascular: Negative.   Musculoskeletal: Positive for myalgias and neck pain. Negative for arthralgias.       As per HPI  Skin: Negative for color change, pallor and rash.  Neurological: Negative.   All other systems reviewed and are negative.    Physical Exam Triage Vital Signs ED Triage Vitals  Enc Vitals Group     BP 04/14/17 1729 119/75     Pulse Rate 04/14/17 1729 92     Resp 04/14/17 1729 16     Temp 04/14/17 1729 99.4 F (37.4 C)     Temp Source 04/14/17 1729 Oral     SpO2 04/14/17 1729 98 %     Weight 04/14/17 1727 180 lb (81.6 kg)     Height 04/14/17 1727 5\' 4"  (1.626 m)     Head Circumference --      Peak Flow --      Pain Score 04/14/17 1727 8     Pain Loc --      Pain Edu? --  Excl. in GC? --    No data found.  Updated Vital Signs BP 119/75 (BP Location: Left Arm)   Pulse 92   Temp 99.4 F (37.4 C) (Oral)   Resp 16   Ht 5\' 4"  (1.626 m)   Wt 180 lb (81.6 kg)   LMP 03/12/2017   SpO2 98%   BMI 30.90 kg/m   Visual Acuity Right Eye Distance:   Left Eye Distance:   Bilateral Distance:    Right Eye Near:   Left Eye Near:    Bilateral Near:     Physical Exam  Constitutional: She is oriented to person, place, and time. She appears well-developed and well-nourished. No distress.  HENT:  Head: Normocephalic and atraumatic.  Eyes: EOM are normal. Pupils are equal, round, and reactive to light.  Neck: Normal range of motion. Neck supple.  Cardiovascular: Normal rate.  Pulmonary/Chest: Effort normal.  Musculoskeletal: She exhibits tenderness. She exhibits no edema or deformity.  Tenderness along the bilateral trapezius by crit of the left. Tenderness along the para thoracic musculature right greater than left. Tenderness along the right  upper back musculature and deltoid. Decreased range of motion of the neck due to soreness and pain within the muscles. No spinal tenderness, deformity or step-off deformity. No distal weakness or paresthesias. Normal coordination and strength of upper extremities  Lymphadenopathy:    She has no cervical adenopathy.  Neurological: She is alert and oriented to person, place, and time. No cranial nerve deficit.  Skin: Skin is warm and dry.  Psychiatric: She has a normal mood and affect.     UC Treatments / Results  Labs (all labs ordered are listed, but only abnormal results are displayed) Labs Reviewed - No data to display  EKG  EKG Interpretation None       Radiology No results found.  Procedures Procedures (including critical care time)  Medications Ordered in UC Medications  ketorolac (TORADOL) injection 45 mg (not administered)     Initial Impression / Assessment and Plan / UC Course  I have reviewed the triage vital signs and the nursing notes.  Pertinent labs & imaging results that were available during my care of the patient were reviewed by me and considered in my medical decision making (see chart for details).    Heat. Use the diclofenac gel 4 times a day. In a couple days start performing stretches as directed. Take the medications also as directed. The muscle relaxant and tramadol can cause some sedation or drowsiness.    Final Clinical Impressions(s) / UC Diagnoses   Final diagnoses:  Myofascial pain on right side  Muscle pain    ED Discharge Orders        Ordered    diclofenac sodium (VOLTAREN) 1 % GEL  4 times daily     04/14/17 1805    naproxen (NAPROSYN) 375 MG tablet  2 times daily     04/14/17 1805    traMADol (ULTRAM) 50 MG tablet  Every 6 hours PRN     04/14/17 1805    cyclobenzaprine (FLEXERIL) 5 MG tablet  3 times daily PRN     04/14/17 1805       Controlled Substance Prescriptions [place] Controlled Substance Registry consulted? Not  Applicable   Hayden RasmussenMabe, Toshiyuki Fredell, NP 04/14/17 1811

## 2017-05-23 DIAGNOSIS — Z131 Encounter for screening for diabetes mellitus: Secondary | ICD-10-CM | POA: Diagnosis not present

## 2017-05-23 DIAGNOSIS — Z114 Encounter for screening for human immunodeficiency virus [HIV]: Secondary | ICD-10-CM | POA: Diagnosis not present

## 2017-05-23 DIAGNOSIS — Z1159 Encounter for screening for other viral diseases: Secondary | ICD-10-CM | POA: Diagnosis not present

## 2017-05-23 DIAGNOSIS — Z130 Encounter for screening for diseases of the blood and blood-forming organs and certain disorders involving the immune mechanism: Secondary | ICD-10-CM | POA: Diagnosis not present

## 2017-05-23 DIAGNOSIS — R109 Unspecified abdominal pain: Secondary | ICD-10-CM | POA: Diagnosis not present

## 2017-05-23 DIAGNOSIS — N83291 Other ovarian cyst, right side: Secondary | ICD-10-CM | POA: Diagnosis not present

## 2017-05-23 DIAGNOSIS — Z1329 Encounter for screening for other suspected endocrine disorder: Secondary | ICD-10-CM | POA: Diagnosis not present

## 2017-05-23 DIAGNOSIS — Z01419 Encounter for gynecological examination (general) (routine) without abnormal findings: Secondary | ICD-10-CM | POA: Diagnosis not present

## 2017-05-23 DIAGNOSIS — Z6831 Body mass index (BMI) 31.0-31.9, adult: Secondary | ICD-10-CM | POA: Diagnosis not present

## 2017-05-23 DIAGNOSIS — Z118 Encounter for screening for other infectious and parasitic diseases: Secondary | ICD-10-CM | POA: Diagnosis not present

## 2017-05-23 DIAGNOSIS — Z13220 Encounter for screening for lipoid disorders: Secondary | ICD-10-CM | POA: Diagnosis not present

## 2017-05-23 DIAGNOSIS — Z113 Encounter for screening for infections with a predominantly sexual mode of transmission: Secondary | ICD-10-CM | POA: Diagnosis not present

## 2017-05-23 DIAGNOSIS — N760 Acute vaginitis: Secondary | ICD-10-CM | POA: Diagnosis not present

## 2017-05-23 DIAGNOSIS — Z0000 Encounter for general adult medical examination without abnormal findings: Secondary | ICD-10-CM | POA: Diagnosis not present

## 2017-07-03 ENCOUNTER — Ambulatory Visit (HOSPITAL_COMMUNITY)
Admission: EM | Admit: 2017-07-03 | Discharge: 2017-07-03 | Disposition: A | Discharge status: Home / Self Care | Payer: BLUE CROSS/BLUE SHIELD | Attending: Family Medicine | Admitting: Family Medicine

## 2017-07-03 ENCOUNTER — Encounter (HOSPITAL_COMMUNITY): Payer: Self-pay | Admitting: Emergency Medicine

## 2017-07-03 ENCOUNTER — Other Ambulatory Visit: Payer: Self-pay

## 2017-07-03 DIAGNOSIS — H9202 Otalgia, left ear: Secondary | ICD-10-CM

## 2017-07-03 DIAGNOSIS — R51 Headache: Secondary | ICD-10-CM | POA: Diagnosis not present

## 2017-07-03 DIAGNOSIS — M791 Myalgia, unspecified site: Secondary | ICD-10-CM | POA: Diagnosis not present

## 2017-07-03 DIAGNOSIS — J029 Acute pharyngitis, unspecified: Secondary | ICD-10-CM | POA: Diagnosis not present

## 2017-07-03 DIAGNOSIS — R69 Illness, unspecified: Secondary | ICD-10-CM

## 2017-07-03 DIAGNOSIS — J111 Influenza due to unidentified influenza virus with other respiratory manifestations: Secondary | ICD-10-CM

## 2017-07-03 MED ORDER — KETOROLAC TROMETHAMINE 60 MG/2ML IM SOLN
INTRAMUSCULAR | Status: AC
  Filled 2017-07-03: qty 2

## 2017-07-03 MED ORDER — OSELTAMIVIR PHOSPHATE 75 MG PO CAPS: 75.0000 mg | ORAL_CAPSULE | Freq: Two times a day (BID) | ORAL | 0 refills | Status: AC

## 2017-07-03 MED ORDER — FLUTICASONE PROPIONATE 50 MCG/ACT NA SUSP: 1.0000 | Freq: Every day | NASAL | 2 refills | Status: DC

## 2017-07-03 MED ORDER — KETOROLAC TROMETHAMINE 60 MG/2ML IM SOLN
60.0000 mg | Freq: Once | INTRAMUSCULAR | Status: AC
  Administered 2017-07-03: 60 mg via INTRAMUSCULAR

## 2017-07-03 NOTE — ED Triage Notes (Signed)
Pt c/o fever, nasal congestion, sore throat, body aches HA x2 days.

## 2017-07-03 NOTE — ED Provider Notes (Signed)
[facility]    CSN: [id number] Arrival date & time: 07/03/17  1003     History   Chief Complaint Chief Complaint  Patient presents with  . Fever  . Generalized Body Aches    HPI Shamya S Rubbie BattiestRone is a 37 y.o. female.   Chamia presents with complaints of headache, body aches, subjective fevers, cough, sneezing, sore throat, congestion which started the night of 2/5. Left ear pain. 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 Rates pain 8/ 10 to body. Took ibuprofen prior to arrival. Took tylenol cold/flu yesterday which helped some. Did not get a flu vaccine this season. Without rash. Without shortness of breath. Without contributing medical history, takes zoloft daily.    ROS per HPI.       Past Medical History:  Diagnosis Date  . Anemia     There are no active problems to display for this patient.   Past Surgical History:  Procedure Laterality Date  . ESSURE TUBAL LIGATION      OB History    No data available       Home Medications    Prior to Admission medications   Medication Sig Start Date End Date Taking? Authorizing Provider  fluticasone (FLONASE) 50 MCG/ACT nasal spray Place 1 spray into both nostrils daily. 07/03/17   Georgetta HaberBurky, Wisam Siefring B, NP  ibuprofen (ADVIL,MOTRIN) 800 MG tablet Take 1 tablet (800 mg total) by mouth 3 (three) times daily. Patient not taking: Reported on 07/03/2017 05/22/15   Eber HongMiller, Brian, MD  naproxen (NAPROSYN) 375 MG tablet Take 1 tablet (375 mg total) 2 (two) times daily by mouth. Patient not taking: Reported on 07/03/2017 04/14/17   Hayden RasmussenMabe, David, NP  oseltamivir (TAMIFLU) 75 MG capsule Take 1 capsule (75 mg total) by mouth every 12 (twelve) hours for 5 days. 07/03/17 07/08/17  Georgetta HaberBurky, Sirenia Whitis B, NP  sertraline (ZOLOFT) 50 MG tablet Take 50 mg daily by mouth.    [provider]    Family History No family history on file.  Social History Social History   Tobacco Use  .  Smoking status: Current Some Day Smoker    Types: Cigarettes  . Smokeless tobacco: Never Used  Substance Use Topics  . Alcohol use: Yes  . Drug use: No     Allergies   Patient has no known allergies.   Review of Systems Review of Systems   Physical Exam Triage Vital Signs ED Triage Vitals  Enc Vitals Group     BP 07/03/17 1033 112/70     Pulse Rate 07/03/17 1033 82     Resp 07/03/17 1033 18     Temp 07/03/17 1033 98.3 F (36.8 C)     Temp Source 07/03/17 1033 Oral     SpO2 07/03/17 1033 100 %     Weight --      Height --      Head Circumference --      Peak Flow --      Pain Score 07/03/17 1034 8     Pain Loc --      Pain Edu? --      Excl. in GC? --    No data found.  Updated Vital Signs BP 112/70 (BP Location: Left Arm)   Pulse 82   Temp 98.3 F (36.8 C) (Oral)   Resp 18   LMP  (LMP Unknown)   SpO2 100%   Visual Acuity Right Eye Distance:  Left Eye Distance:   Bilateral Distance:    Right Eye Near:   Left Eye Near:    Bilateral Near:     Physical Exam  Constitutional: She is oriented to person, place, and time. She appears well-developed and well-nourished. She appears ill. No distress.  HENT:  Head: Normocephalic and atraumatic.  Right Ear: Tympanic membrane, external ear and ear canal normal.  Left Ear: External ear and ear canal normal. Tympanic membrane is injected. Tympanic membrane is not bulging.  Nose: Mucosal edema and rhinorrhea present. Right sinus exhibits no maxillary sinus tenderness and no frontal sinus tenderness. Left sinus exhibits no maxillary sinus tenderness and no frontal sinus tenderness.  Mouth/Throat: Uvula is midline and mucous membranes are normal. Posterior oropharyngeal erythema present. No tonsillar exudate.  Eyes: Conjunctivae and EOM are normal. Pupils are equal, round, and reactive to light.  Cardiovascular: Normal rate, regular rhythm and normal heart sounds.  Pulmonary/Chest: Effort normal and breath sounds  normal.  Neurological: She is alert and oriented to person, place, and time.  Skin: Skin is warm and dry.     UC Treatments / Results  Labs (all labs ordered are listed, but only abnormal results are displayed) Labs Reviewed - No data to display  EKG  EKG Interpretation None       Radiology No results found.  Procedures Procedures (including critical care time)  Medications Ordered in UC Medications  ketorolac (TORADOL) injection 60 mg (not administered)     Initial Impression / Assessment and Plan / UC Course  I have reviewed the triage vital signs and the nursing notes.  Pertinent labs & imaging results that were available during my care of the patient were reviewed by me and considered in my medical decision making (see chart for details).     Benign physical findings on exam. Fever controlled at this time, without hypoxia or tachycardia. History and physical consistent with viral illness. tamiflu offered, discussed risks and benefits with patient. Supportive cares recommended. Return precautions provided. Patient verbalized understanding and agreeable to plan.    Final Clinical Impressions(s) / UC Diagnoses   Final diagnoses:  Influenza-like illness    ED Discharge Orders        Ordered    oseltamivir (TAMIFLU) 75 MG capsule  Every 12 hours     07/03/17 1051    fluticasone (FLONASE) 50 MCG/ACT nasal spray  Daily     07/03/17 1051       Controlled Substance Prescriptions [place] Controlled Substance Registry consulted? Not Applicable   Georgetta Haber, NP 07/03/17 1056

## 2017-07-03 NOTE — Discharge Instructions (Signed)
Push fluids to ensure adequate hydration and keep secretions thin.  Tylenol and/or ibuprofen as needed for pain or fevers. Do not take another dose of ibuprofen until after 5p today. Rest. Tamiflu, if you choose to start this medication please start ASAP, take with food as may cause stomach upset. If symptoms worsen or do not improve in the next week to return to be seen or to follow up with your PCP.

## 2017-07-29 ENCOUNTER — Other Ambulatory Visit: Payer: Self-pay

## 2017-07-29 ENCOUNTER — Encounter (HOSPITAL_COMMUNITY): Payer: Self-pay | Admitting: Emergency Medicine

## 2017-07-29 ENCOUNTER — Emergency Department (HOSPITAL_COMMUNITY)
Admission: EM | Admit: 2017-07-29 | Discharge: 2017-07-30 | Disposition: A | Discharge status: Home / Self Care | Payer: BLUE CROSS/BLUE SHIELD | Attending: Emergency Medicine | Admitting: Emergency Medicine

## 2017-07-29 DIAGNOSIS — Z79899 Other long term (current) drug therapy: Secondary | ICD-10-CM | POA: Diagnosis not present

## 2017-07-29 DIAGNOSIS — F17210 Nicotine dependence, cigarettes, uncomplicated: Secondary | ICD-10-CM | POA: Diagnosis not present

## 2017-07-29 DIAGNOSIS — Z711 Person with feared health complaint in whom no diagnosis is made: Secondary | ICD-10-CM | POA: Diagnosis not present

## 2017-07-29 NOTE — ED Triage Notes (Signed)
Pt st's she thinks she has a tampon stuck in her vagina for approx 2 days.  Pt denies any pain

## 2017-07-30 NOTE — Discharge Instructions (Signed)
There was no retained tampon on your evaluation.

## 2017-07-30 NOTE — ED Provider Notes (Signed)
[facility] EMERGENCY [facility]PART[facility] Provider Note   CSN: [id number] Arrival date & time: 07/29/17  2212     History   Chief Complaint Chief Complaint  Patient presents with  . Foreign Body in Vagina    HPI Katelyn Soto is a 37 y.o. female.  HPI  This 37 year old female with history of anemia who presents with concerns for a retained tampon in the vagina.  Patient reports that she was using tampons 2 days ago for some spotting.  She does not remember whether she removed the tampon.  However, today she went to the bathroom and a string was found when she wiped.  She is concerned that there may be a retained tampon.  She is currently on birth control.  She does not think that she could be pregnant.  She has no abdominal pain or fevers.  No concerns for STDs.  Past Medical History:  Diagnosis Date  . Anemia     There are no active problems to display for this patient.   Past Surgical History:  Procedure Laterality Date  . ESSURE TUBAL LIGATION      OB History    No data available       Home Medications    Prior to Admission medications   Medication Sig Start Date End Date Taking? Authorizing Provider  fluticasone (FLONASE) 50 MCG/ACT nasal spray Place 1 spray into both nostrils daily. 07/03/17   Georgetta HaberBurky, Natalie B, NP  ibuprofen (ADVIL,MOTRIN) 800 MG tablet Take 1 tablet (800 mg total) by mouth 3 (three) times daily. Patient not taking: Reported on 07/03/2017 05/22/15   Eber HongMiller, Brian, MD  naproxen (NAPROSYN) 375 MG tablet Take 1 tablet (375 mg total) 2 (two) times daily by mouth. Patient not taking: Reported on 07/03/2017 04/14/17   Hayden RasmussenMabe, David, NP  sertraline (ZOLOFT) 50 MG tablet Take 50 mg daily by mouth.    [provider]    Family History No family history on file.  Social History Social History   Tobacco Use  . Smoking status: Current Some Day Smoker    Types: Cigarettes  . Smokeless tobacco: Never Used  Substance Use Topics  .  Alcohol use: Yes  . Drug use: No     Allergies   Patient has no known allergies.   Review of Systems Review of Systems  Constitutional: Negative for fever.  Gastrointestinal: Negative for abdominal pain.  Genitourinary: Negative for dysuria, vaginal bleeding, vaginal discharge and vaginal pain.  All other systems reviewed and are negative.    Physical Exam Updated Vital Signs BP (!) 123/96 (BP Location: Right Arm)   Pulse 83   Temp 98.7 F (37.1 C) (Oral)   Resp 18   Ht 5\' 3"  (1.6 m)   Wt 83.9 kg (185 lb)   LMP  (LMP Unknown)   SpO2 100%   BMI 32.77 kg/m   Physical Exam  Constitutional: She is oriented to person, place, and time. She appears well-developed and well-nourished.  HENT:  Head: Normocephalic and atraumatic.  Cardiovascular: Normal rate and regular rhythm.  Pulmonary/Chest: Effort normal. No respiratory distress.  Genitourinary:  Genitourinary Comments: Normal external vaginal exam, no retained foreign bodies noted, no significant vaginal discharge  Neurological: She is alert and oriented to person, place, and time.  Skin: Skin is warm and dry.  Psychiatric: She has a normal mood and affect.  Nursing note and vitals reviewed.    ED Treatments / Results  Labs (all labs ordered are listed,  but only abnormal results are displayed) Labs Reviewed - No data to display  EKG  EKG Interpretation None       Radiology No results found.  Procedures Procedures (including critical care time)  Medications Ordered in ED Medications - No data to display   Initial Impression / Assessment and Plan / ED Course  I have reviewed the triage vital signs and the nursing notes.  Pertinent labs & imaging results that were available during my care of the patient were reviewed by me and considered in my medical decision making (see chart for details).     Patient presents with concerns for retained tampon.  Exam shows no retained foreign body in the vagina.   She is reassured.  Has no other complaints at this time.  After history, exam, and medical workup I feel the patient has been appropriately medically screened and is safe for discharge home. Pertinent diagnoses were discussed with the patient. Patient was given return precautions.   Final Clinical Impressions(s) / ED Diagnoses   Final diagnoses:  Feared complaint without diagnosis    ED Discharge Orders    None       Horton, Mayer Masker, MD 07/30/17 0128

## 2017-11-13 ENCOUNTER — Ambulatory Visit: Payer: BLUE CROSS/BLUE SHIELD | Admitting: Cardiology

## 2017-11-14 ENCOUNTER — Ambulatory Visit: Payer: BLUE CROSS/BLUE SHIELD | Admitting: Cardiology

## 2017-11-14 ENCOUNTER — Encounter: Payer: Self-pay | Admitting: Cardiology

## 2017-11-14 VITALS — BP 120/72 | HR 74 | Ht 63.0 in | Wt 188.0 lb

## 2017-11-14 DIAGNOSIS — R0789 Other chest pain: Secondary | ICD-10-CM

## 2017-11-14 DIAGNOSIS — F17210 Nicotine dependence, cigarettes, uncomplicated: Secondary | ICD-10-CM

## 2017-11-14 DIAGNOSIS — Z13220 Encounter for screening for lipoid disorders: Secondary | ICD-10-CM

## 2017-11-14 HISTORY — DX: Other chest pain: R07.89

## 2017-11-14 HISTORY — DX: Nicotine dependence, cigarettes, uncomplicated: F17.210

## 2017-11-14 NOTE — Progress Notes (Signed)
Cardiology Office Note:    Date:  11/14/2017   ID:  Katelyn Soto, DOB 09/05/1980, MRN [id number]  PCP:  Noland Fordyce, MD  Cardiologist:  Garwin Brothers, MD   Referring MD: No ref. provider found    ASSESSMENT:    1. Screening for cholesterol level   2. Chest tightness   3. Cigarette smoker    PLAN:    In order of problems listed above:  1. Primary prevention stressed with the patient.  Importance of compliance with diet and medications stressed and she vocalized understanding.  She is overweight and diet was discussed and risks of being overweight discussed and she vocalized understanding. 2. I will do lipid work in addition to other blood work which she has not had for quite some time.  This will also help her risk stratification. 3. Her chest pain symptoms are atypical for coronary etiology however in view of risk factors she will undergo exercise stress echo for objective evidence and for reassurance. 4. I spent 5 minutes with the patient discussing solely about smoking. Smoking cessation was counseled. I suggested to the patient also different medications and pharmacological interventions. Patient is keen to try stopping on its own at this time. He will get back to me if he needs any further assistance in this matter. 5. 3 months follow-up or earlier if she has any concerns.   Medication Adjustments/Labs and Tests Ordered: Current medicines are reviewed at length with the patient today.  Concerns regarding medicines are outlined above.  Orders Placed This Encounter  Procedures  . Basic metabolic panel  . CBC with Differential/Platelet  . TSH  . Hepatic function panel  . Lipid panel  . EKG 12-Lead  . ECHOCARDIOGRAM STRESS TEST   No orders of the defined types were placed in this encounter.    History of Present Illness:    Katelyn Soto is a 37 y.o. female who is being seen today for the evaluation of chest tightness.  The patient is a pleasant 37 year old female  with no significant past medical history.  She mentions to me that occasionally she has chest tightness not related to exertion.  No orthopnea or PND.  No radiation to any side of the body.  She also mentions that this is happening in the past 2 to 3 weeks.  She does not exercise on a regular basis and leads a sedentary lifestyle.  Sexual activity does not bring about the symptoms.  At the time of my evaluation, the patient is alert awake oriented and in no distress.  She is seen with her significant other at this appointment.  Past Medical History:  Diagnosis Date  . Anemia   . Iron deficiency anemia 01/14/2016  . Vitamin D deficiency 01/14/2016    Past Surgical History:  Procedure Laterality Date  . ESSURE TUBAL LIGATION      Current Medications: Current Meds  Medication Sig  . sertraline (ZOLOFT) 100 MG tablet Take 100 mg by mouth daily.     Allergies:   Patient has no known allergies.   Social History   Socioeconomic History  . Marital status: Married    Spouse name: Not on file  . Number of children: Not on file  . Years of education: Not on file  . Highest education level: Not on file  Occupational History  . Not on file  Social Needs  . Financial resource strain: Not on file  . Food insecurity:    Worry: Not on  file    Inability: Not on file  . Transportation needs:    Medical: Not on file    Non-medical: Not on file  Tobacco Use  . Smoking status: Current Some Day Smoker    Types: Cigarettes  . Smokeless tobacco: Never Used  Substance and Sexual Activity  . Alcohol use: Yes  . Drug use: No  . Sexual activity: Not on file  Lifestyle  . Physical activity:    Days per week: Not on file    Minutes per session: Not on file  . Stress: Not on file  Relationships  . Social connections:    Talks on phone: Not on file    Gets together: Not on file    Attends religious service: Not on file    Active member of club or organization: Not on file    Attends meetings  of clubs or organizations: Not on file    Relationship status: Not on file  Other Topics Concern  . Not on file  Social History Narrative  . Not on file     Family History: The patient's family history includes Cancer in her father; Diabetes in her mother; Heart disease in her mother; Hypertension in her mother.  ROS:   Please see the history of present illness.    All other systems reviewed and are negative.  EKGs/Labs/Other Studies Reviewed:    The following studies were reviewed today: EKG reveals sinus rhythm and nonspecific ST-T changes   Recent Labs: No results found for requested labs within last 8760 hours.  Recent Lipid Panel No results found for: CHOL, TRIG, HDL, CHOLHDL, VLDL, LDLCALC, LDLDIRECT  Physical Exam:    VS:  BP 120/72 (BP Location: Right Arm, Patient Position: Sitting, Cuff Size: Normal)   Pulse 74   Ht 5\' 3"  (1.6 m)   Wt 188 lb (85.3 kg)   SpO2 98%   BMI 33.30 kg/m     Wt Readings from Last 3 Encounters:  11/14/17 188 lb (85.3 kg)  07/29/17 185 lb (83.9 kg)  04/14/17 180 lb (81.6 kg)     GEN: Patient is in no acute distress HEENT: Normal NECK: No JVD; No carotid bruits LYMPHATICS: No lymphadenopathy CARDIAC: S1 S2 regular, 2/6 systolic murmur at the apex. RESPIRATORY:  Clear to auscultation without rales, wheezing or rhonchi  ABDOMEN: Soft, non-tender, non-distended MUSCULOSKELETAL:  No edema; No deformity  SKIN: Warm and dry NEUROLOGIC:  Alert and oriented x 3 PSYCHIATRIC:  Normal affect    Signed, Garwin Brothersajan R Revankar, MD  11/14/2017 10:06 AM    [facility]

## 2017-11-14 NOTE — Patient Instructions (Signed)
Medication Instructions:  Your physician recommends that you continue on your current medications as directed. Please refer to the Current Medication list given to you today.  Labwork: Your physician recommends that you have the following labs drawn: CBC, BMP, TSH, liver and lipid panel.  Testing/Procedures: Your physician has requested that you have a stress echocardiogram. For further information please visit [email or web address]. Please follow instruction sheet as given.  Follow-Up: Your physician recommends that you schedule a follow-up appointment in: 2 months  Any Other Special Instructions Will Be Listed Below (If Applicable).     If you need a refill on your cardiac medications before your next appointment, please call your pharmacy.   [facility]  Garey HamAshley A, RN, BSN   Cardiopulmonary Exercise Stress Test Cardiopulmonary exercise testing (CPET) is a test that checks how your heart and lungs react to exercise. This is called your exercise capacity. During this test, you will walk or run on a treadmill or pedal on a stationary bike while tests are done on your heart and lungs. You may have this test to:  See why you are short of breath.  Check for exercise intolerance.  See how your lungs work.  See how your heart works.  Check for how you are responding to a heart or lung rehabilitation program.  See if you have a heart or lung problem.  See if you are healthy enough to have surgery.  What happens before the procedure?  Follow instructions from your doctor about what you cannot eat or drink.  Ask your doctor about changing or stopping your normal medicines. This is important if you take diabetes medicines or blood thinners.  Wear loose, comfortable clothing and shoes.  If you use an inhaler, bring it with you to the test. What happens during the procedure?  A blood pressure cuff will be placed on your arm.  Several stick-on patches (electrodes) will be  placed on your chest. They will be attached to an electrocardiogram (EKG) machine.  A clip-on monitor that measures the amount of oxygen in your blood will be placed on your finger (pulse oximeter).  A clip will be placed on your nose and a mouthpiece will be placed in your mouth. This may be held in place with a headpiece. You will breathe through the mouthpiece during the test.  You will be asked to start exercising. You will be closely watched while you exercise.  The amount of effort for your exercise will be gradually increased.  During exercise, the test will measure: ? Your heart rate. ? Your heart rhythm. ? Your oxygen blood level. ? The amount of oxygen and carbon dioxide that you breathe out.  The test will end when: ? You have finished the test. ? You have reached your maximum ability to exercise. ? You have chest or leg pain, dizziness, or shortness of breath. The procedure may vary among doctors and hospitals. What happens after the procedure?  Your blood pressure and EKG will be checked to watch how you recover from the test. This information is not intended to replace advice given to you by your health care provider. Make sure you discuss any questions you have with your health care provider. Document Released: 05/01/2009 Document Revised: 10/03/2015 Document Reviewed: 03/27/2015 Elsevier Interactive Patient Education  2018 [employer]

## 2017-11-15 LAB — CBC WITH DIFFERENTIAL/PLATELET
BASOS: 0 %
Basophils Absolute: 0 10*3/uL (ref 0.0–0.2)
EOS (ABSOLUTE): 0.1 10*3/uL (ref 0.0–0.4)
Eos: 2 %
HEMATOCRIT: 39.2 % (ref 34.0–46.6)
Hemoglobin: 12.7 g/dL (ref 11.1–15.9)
IMMATURE GRANS (ABS): 0 10*3/uL (ref 0.0–0.1)
IMMATURE GRANULOCYTES: 0 %
LYMPHS: 32 %
Lymphocytes Absolute: 2.1 10*3/uL (ref 0.7–3.1)
MCH: 23.1 pg — AB (ref 26.6–33.0)
MCHC: 32.4 g/dL (ref 31.5–35.7)
MCV: 71 fL — AB (ref 79–97)
Monocytes Absolute: 0.4 10*3/uL (ref 0.1–0.9)
Monocytes: 6 %
NEUTROS ABS: 3.8 10*3/uL (ref 1.4–7.0)
Neutrophils: 60 %
PLATELETS: 199 10*3/uL (ref 150–450)
RBC: 5.49 x10E6/uL — ABNORMAL HIGH (ref 3.77–5.28)
RDW: 15.2 % (ref 12.3–15.4)
WBC: 6.4 10*3/uL (ref 3.4–10.8)

## 2017-11-15 LAB — BASIC METABOLIC PANEL
BUN/Creatinine Ratio: 11 (ref 9–23)
BUN: 8 mg/dL (ref 6–20)
CALCIUM: 8.8 mg/dL (ref 8.7–10.2)
CO2: 21 mmol/L (ref 20–29)
CREATININE: 0.7 mg/dL (ref 0.57–1.00)
Chloride: 107 mmol/L — ABNORMAL HIGH (ref 96–106)
GFR, EST AFRICAN AMERICAN: 127 mL/min/{1.73_m2} (ref >59)
GFR, EST NON AFRICAN AMERICAN: 110 mL/min/{1.73_m2} (ref >59)
Glucose: 92 mg/dL (ref 65–99)
POTASSIUM: 4.5 mmol/L (ref 3.5–5.2)
SODIUM: 141 mmol/L (ref 134–144)

## 2017-11-15 LAB — HEPATIC FUNCTION PANEL
ALBUMIN: 4.2 g/dL (ref 3.5–5.5)
ALK PHOS: 55 IU/L (ref 39–117)
ALT: 15 IU/L (ref 0–32)
AST: 18 IU/L (ref 0–40)
BILIRUBIN TOTAL: 0.2 mg/dL (ref 0.0–1.2)
BILIRUBIN, DIRECT: 0.08 mg/dL (ref 0.00–0.40)
Total Protein: 7.1 g/dL (ref 6.0–8.5)

## 2017-11-15 LAB — TSH: TSH: 1.56 u[IU]/mL (ref 0.450–4.500)

## 2017-11-15 LAB — LIPID PANEL
CHOL/HDL RATIO: 4.2 ratio (ref 0.0–4.4)
Cholesterol, Total: 166 mg/dL (ref 100–199)
HDL: 40 mg/dL (ref >39)
LDL Calculated: 115 mg/dL — ABNORMAL HIGH (ref 0–99)
Triglycerides: 55 mg/dL (ref 0–149)
VLDL Cholesterol Cal: 11 mg/dL (ref 5–40)

## 2017-12-09 ENCOUNTER — Other Ambulatory Visit (HOSPITAL_BASED_OUTPATIENT_CLINIC_OR_DEPARTMENT_OTHER): Payer: BLUE CROSS/BLUE SHIELD

## 2017-12-16 ENCOUNTER — Ambulatory Visit (HOSPITAL_BASED_OUTPATIENT_CLINIC_OR_DEPARTMENT_OTHER): Payer: BLUE CROSS/BLUE SHIELD

## 2017-12-25 ENCOUNTER — Ambulatory Visit (HOSPITAL_BASED_OUTPATIENT_CLINIC_OR_DEPARTMENT_OTHER): Admission: RE | Admit: 2017-12-25 | Payer: BLUE CROSS/BLUE SHIELD | Source: Ambulatory Visit

## 2018-07-04 ENCOUNTER — Other Ambulatory Visit: Payer: Self-pay

## 2018-07-04 ENCOUNTER — Encounter (HOSPITAL_COMMUNITY): Payer: Self-pay | Admitting: Emergency Medicine

## 2018-07-04 ENCOUNTER — Emergency Department (HOSPITAL_COMMUNITY)
Admission: EM | Admit: 2018-07-04 | Discharge: 2018-07-04 | Disposition: A | Discharge status: Home / Self Care | Payer: BLUE CROSS/BLUE SHIELD | Attending: Emergency Medicine | Admitting: Emergency Medicine

## 2018-07-04 DIAGNOSIS — F17210 Nicotine dependence, cigarettes, uncomplicated: Secondary | ICD-10-CM | POA: Insufficient documentation

## 2018-07-04 DIAGNOSIS — J111 Influenza due to unidentified influenza virus with other respiratory manifestations: Secondary | ICD-10-CM | POA: Insufficient documentation

## 2018-07-04 DIAGNOSIS — Z79899 Other long term (current) drug therapy: Secondary | ICD-10-CM | POA: Diagnosis not present

## 2018-07-04 DIAGNOSIS — R509 Fever, unspecified: Secondary | ICD-10-CM | POA: Diagnosis present

## 2018-07-04 DIAGNOSIS — R69 Illness, unspecified: Secondary | ICD-10-CM

## 2018-07-04 MED ORDER — KETOROLAC TROMETHAMINE 60 MG/2ML IM SOLN
30.0000 mg | Freq: Once | INTRAMUSCULAR | Status: AC
  Administered 2018-07-04: 30 mg via INTRAMUSCULAR
  Filled 2018-07-04: qty 2

## 2018-07-04 MED ORDER — OSELTAMIVIR PHOSPHATE 75 MG PO CAPS: 75.0000 mg | ORAL_CAPSULE | Freq: Two times a day (BID) | ORAL | 0 refills | Status: DC

## 2018-07-04 NOTE — ED Triage Notes (Signed)
Patient reports fever with generalized body aches and headache onset today , no cough or chest congestion .

## 2018-07-04 NOTE — ED Provider Notes (Signed)
[facility] EMERGENCY DEPARTMENT Provider Note   CSN: [id number] Arrival date & time: 07/04/18  2009     History   Chief Complaint Chief Complaint  Patient presents with  . Fever    Body Aches/Headache    HPI Katelyn Soto is a 38 y.o. female who presents with flulike symptoms.  Past medical history significant for history of anemia, smoking.  The patient states that at 3:00PM today she had an acute onset of fever, chills, body aches.  She reports associated runny nose, headache, sneezing.  Her children have also been sick and she brought 1 of them to the pediatrician and they told her it was likely the flu.  The patient has not had a flu shot this year.  She denies sore throat, chest pain, shortness of breath, cough.  HPI  Past Medical History:  Diagnosis Date  . Anemia   . Iron deficiency anemia 01/14/2016  . Vitamin D deficiency 01/14/2016    Patient Active Problem List   Diagnosis Date Noted  . Chest tightness 11/14/2017  . Cigarette smoker 11/14/2017  . Iron deficiency anemia 01/14/2016  . Vitamin D deficiency 01/14/2016    Past Surgical History:  Procedure Laterality Date  . ESSURE TUBAL LIGATION       OB History   No obstetric history on file.      Home Medications    Prior to Admission medications   Medication Sig Start Date End Date Taking? Authorizing Provider  oseltamivir (TAMIFLU) 75 MG capsule Take 1 capsule (75 mg total) by mouth every 12 (twelve) hours. 07/04/18   Bethel Born, PA-C  sertraline (ZOLOFT) 100 MG tablet Take 100 mg by mouth daily. 10/15/17   [provider]    Family History Family History  Problem Relation Age of Onset  . Hypertension Mother   . Diabetes Mother   . Heart disease Mother   . Cancer Father     Social History Social History   Tobacco Use  . Smoking status: Current Some Day Smoker    Types: Cigarettes  . Smokeless tobacco: Never Used  Substance Use Topics  . Alcohol use: Yes    . Drug use: No     Allergies   Patient has no known allergies.   Review of Systems Review of Systems  Constitutional: Positive for chills and fever.  HENT: Positive for congestion, rhinorrhea and sneezing.   Respiratory: Negative for cough and shortness of breath.   Cardiovascular: Negative for chest pain.  Musculoskeletal: Positive for myalgias.  All other systems reviewed and are negative.    Physical Exam Updated Vital Signs Pulse 90   Temp 100 F (37.8 C) (Oral)   Resp 16   LMP 06/26/2018   SpO2 99%   Physical Exam Vitals signs and nursing note reviewed.  Constitutional:      General: She is not in acute distress.    Appearance: Normal appearance. She is well-developed.     Comments: Calm and cooperative.  Mildly ill-appearing.  HENT:     Head: Normocephalic and atraumatic.     Right Ear: Hearing, tympanic membrane, ear canal and external ear normal.     Left Ear: Hearing, tympanic membrane, ear canal and external ear normal.     Nose: Rhinorrhea present.     Mouth/Throat:     Lips: Pink.     Pharynx: Oropharynx is clear. No pharyngeal swelling.  Eyes:     General: No scleral icterus.  Right eye: No discharge.        Left eye: No discharge.     Conjunctiva/sclera: Conjunctivae normal.     Pupils: Pupils are equal, round, and reactive to light.  Neck:     Musculoskeletal: Normal range of motion.  Cardiovascular:     Rate and Rhythm: Normal rate and regular rhythm.  Pulmonary:     Effort: Pulmonary effort is normal. No respiratory distress.     Breath sounds: Normal breath sounds.  Abdominal:     General: There is no distension.  Skin:    General: Skin is warm and dry.  Neurological:     Mental Status: She is alert and oriented to person, place, and time.  Psychiatric:        Behavior: Behavior normal.      ED Treatments / Results  Labs (all labs ordered are listed, but only abnormal results are displayed) Labs Reviewed - No data to  display  EKG None  Radiology No results found.  Procedures Procedures (including critical care time)  Medications Ordered in ED Medications  ketorolac (TORADOL) injection 30 mg (30 mg Intramuscular Given 07/04/18 2151)     Initial Impression / Assessment and Plan / ED Course  I have reviewed the triage vital signs and the nursing notes.  Pertinent labs & imaging results that were available during my care of the patient were reviewed by me and considered in my medical decision making (see chart for details).  38 year old female with flu like symptoms. Temp is elevated. Exam is overall consistent with viral illness. She was given IM Toradol for headache and body aches. Encouraged supportive care and will rx Tamiflu.  Final Clinical Impressions(s) / ED Diagnoses   Final diagnoses:  Influenza-like illness    ED Discharge Orders         Ordered    oseltamivir (TAMIFLU) 75 MG capsule  Every 12 hours     07/04/18 2140           Bethel Born, PA-C 07/04/18 2229    Gerhard Munch, MD 07/05/18 2312

## 2018-07-04 NOTE — ED Notes (Signed)
Patient verbalizes understanding of discharge instructions. Opportunity for questioning and answers were provided. Armband removed by staff, pt discharged from ED.  

## 2018-07-04 NOTE — Discharge Instructions (Signed)
Please rest and drink plenty of fluids.  Take Tylenol or Ibuprofen for pain/fever Take Tamiflu twice a day for 5 days Return to the ED for worsening symptoms

## 2018-07-21 ENCOUNTER — Other Ambulatory Visit: Payer: Self-pay

## 2018-07-21 ENCOUNTER — Emergency Department (HOSPITAL_COMMUNITY)
Admission: EM | Admit: 2018-07-21 | Discharge: 2018-07-21 | Discharge status: Against Medical Advice | Payer: BLUE CROSS/BLUE SHIELD | Attending: Emergency Medicine | Admitting: Emergency Medicine

## 2018-07-21 ENCOUNTER — Encounter (HOSPITAL_COMMUNITY): Payer: Self-pay

## 2018-07-21 DIAGNOSIS — M25512 Pain in left shoulder: Secondary | ICD-10-CM | POA: Insufficient documentation

## 2018-07-21 DIAGNOSIS — M25511 Pain in right shoulder: Secondary | ICD-10-CM | POA: Insufficient documentation

## 2018-07-21 DIAGNOSIS — Z5321 Procedure and treatment not carried out due to patient leaving prior to being seen by health care provider: Secondary | ICD-10-CM | POA: Diagnosis not present

## 2018-07-21 DIAGNOSIS — M542 Cervicalgia: Secondary | ICD-10-CM | POA: Diagnosis not present

## 2018-07-21 NOTE — ED Notes (Signed)
No answer x1

## 2018-07-21 NOTE — ED Triage Notes (Signed)
Pt her with neck soreness and bilateral shoulder pain.  Pt states pain came  On in the afternoon and got worse throughout the day.  No neck rigidity, no cough. Ambulatory to triage

## 2018-08-28 DIAGNOSIS — R350 Frequency of micturition: Secondary | ICD-10-CM | POA: Diagnosis not present

## 2018-12-18 DIAGNOSIS — Z0000 Encounter for general adult medical examination without abnormal findings: Secondary | ICD-10-CM | POA: Diagnosis not present

## 2018-12-18 DIAGNOSIS — N943 Premenstrual tension syndrome: Secondary | ICD-10-CM | POA: Diagnosis not present

## 2018-12-18 DIAGNOSIS — Z1231 Encounter for screening mammogram for malignant neoplasm of breast: Secondary | ICD-10-CM | POA: Diagnosis not present

## 2018-12-18 DIAGNOSIS — Z131 Encounter for screening for diabetes mellitus: Secondary | ICD-10-CM | POA: Diagnosis not present

## 2018-12-18 DIAGNOSIS — Z6832 Body mass index (BMI) 32.0-32.9, adult: Secondary | ICD-10-CM | POA: Diagnosis not present

## 2018-12-18 DIAGNOSIS — Z130 Encounter for screening for diseases of the blood and blood-forming organs and certain disorders involving the immune mechanism: Secondary | ICD-10-CM | POA: Diagnosis not present

## 2018-12-18 DIAGNOSIS — Z1151 Encounter for screening for human papillomavirus (HPV): Secondary | ICD-10-CM | POA: Diagnosis not present

## 2018-12-18 DIAGNOSIS — Z13220 Encounter for screening for lipoid disorders: Secondary | ICD-10-CM | POA: Diagnosis not present

## 2018-12-18 DIAGNOSIS — Z1329 Encounter for screening for other suspected endocrine disorder: Secondary | ICD-10-CM | POA: Diagnosis not present

## 2018-12-18 DIAGNOSIS — Z01419 Encounter for gynecological examination (general) (routine) without abnormal findings: Secondary | ICD-10-CM | POA: Diagnosis not present

## 2019-06-21 ENCOUNTER — Encounter (HOSPITAL_COMMUNITY): Payer: Self-pay

## 2019-06-21 ENCOUNTER — Other Ambulatory Visit: Payer: Self-pay

## 2019-06-21 ENCOUNTER — Ambulatory Visit (INDEPENDENT_AMBULATORY_CARE_PROVIDER_SITE_OTHER): Payer: Self-pay

## 2019-06-21 ENCOUNTER — Ambulatory Visit (HOSPITAL_COMMUNITY)
Admission: EM | Admit: 2019-06-21 | Discharge: 2019-06-21 | Disposition: A | Discharge status: Home / Self Care | Payer: Self-pay | Attending: Internal Medicine | Admitting: Internal Medicine

## 2019-06-21 DIAGNOSIS — K5900 Constipation, unspecified: Secondary | ICD-10-CM | POA: Insufficient documentation

## 2019-06-21 DIAGNOSIS — M549 Dorsalgia, unspecified: Secondary | ICD-10-CM | POA: Insufficient documentation

## 2019-06-21 DIAGNOSIS — Z3202 Encounter for pregnancy test, result negative: Secondary | ICD-10-CM

## 2019-06-21 LAB — POCT URINALYSIS DIP (DEVICE)
Bilirubin Urine: NEGATIVE
Glucose, UA: NEGATIVE mg/dL
Hgb urine dipstick: NEGATIVE
Ketones, ur: NEGATIVE mg/dL
Leukocytes,Ua: NEGATIVE
Nitrite: NEGATIVE
Protein, ur: NEGATIVE mg/dL
Specific Gravity, Urine: >=1.03 (ref 1.005–1.030)
Urobilinogen, UA: 0.2 mg/dL (ref 0.0–1.0)
pH: 6 (ref 5.0–8.0)

## 2019-06-21 LAB — COMPREHENSIVE METABOLIC PANEL
ALT: 13 U/L (ref 0–44)
AST: 15 U/L (ref 15–41)
Albumin: 3.5 g/dL (ref 3.5–5.0)
Alkaline Phosphatase: 45 U/L (ref 38–126)
Anion gap: 6 (ref 5–15)
BUN: 11 mg/dL (ref 6–20)
CO2: 25 mmol/L (ref 22–32)
Calcium: 8.8 mg/dL — ABNORMAL LOW (ref 8.9–10.3)
Chloride: 107 mmol/L (ref 98–111)
Creatinine, Ser: 0.79 mg/dL (ref 0.44–1.00)
GFR calc Af Amer: >60 mL/min (ref >60)
GFR calc non Af Amer: >60 mL/min (ref >60)
Glucose, Bld: 79 mg/dL (ref 70–99)
Potassium: 4.6 mmol/L (ref 3.5–5.1)
Sodium: 138 mmol/L (ref 135–145)
Total Bilirubin: 0.6 mg/dL (ref 0.3–1.2)
Total Protein: 6.9 g/dL (ref 6.5–8.1)

## 2019-06-21 LAB — POC URINE PREG, ED: Preg Test, Ur: NEGATIVE

## 2019-06-21 LAB — POCT PREGNANCY, URINE: Preg Test, Ur: NEGATIVE

## 2019-06-21 MED ORDER — SENNOSIDES-DOCUSATE SODIUM 8.6-50 MG PO TABS: 1.0000 | ORAL_TABLET | Freq: Two times a day (BID) | ORAL | 0 refills | Status: DC | PRN

## 2019-06-21 MED ORDER — IBUPROFEN 600 MG PO TABS: 600.0000 mg | ORAL_TABLET | Freq: Four times a day (QID) | ORAL | 0 refills | Status: DC | PRN

## 2019-06-21 MED ORDER — CYCLOBENZAPRINE HCL 10 MG PO TABS: 10.0000 mg | ORAL_TABLET | Freq: Two times a day (BID) | ORAL | 0 refills | Status: DC | PRN

## 2019-06-21 NOTE — ED Provider Notes (Signed)
[place]    CSN: [id number] Arrival date & time: 06/21/19  7322      History   Chief Complaint Chief Complaint  Patient presents with  . Abdominal Pain    HPI Katelyn Soto is a 39 y.o. female with history of iron deficiency anemia comes to urgent care with complaints of right-sided abdominal pain and also back pain.  Symptoms started couple days ago and has been progressively worse.  Right lower abdominal pain is constant, throbbing and nonradiating.  She has tried over-the-counter medications with no improvement.  She denies any nausea or vomiting.  Last bowel movement was a few days ago.  Patient also complains of back pain which is worse with movement.  No known relieving factors.  And the pain is associated with some dysuria but no frequency or urgency.  No fever or chills.  No nausea or vomiting.   HPI  Past Medical History:  Diagnosis Date  . Anemia   . Iron deficiency anemia 01/14/2016  . Vitamin D deficiency 01/14/2016    Patient Active Problem List   Diagnosis Date Noted  . Chest tightness 11/14/2017  . Cigarette smoker 11/14/2017  . Iron deficiency anemia 01/14/2016  . Vitamin D deficiency 01/14/2016    Past Surgical History:  Procedure Laterality Date  . ESSURE TUBAL LIGATION      OB History   No obstetric history on file.      Home Medications    Prior to Admission medications   Medication Sig Start Date End Date Taking? Authorizing Provider  cyclobenzaprine (FLEXERIL) 10 MG tablet Take 1 tablet (10 mg total) by mouth 2 (two) times daily as needed for muscle spasms. 06/21/19   Chase Picket, MD  ibuprofen (ADVIL) 600 MG tablet Take 1 tablet (600 mg total) by mouth every 6 (six) hours as needed. 06/21/19   Neyah Ellerman, Myrene Galas, MD  senna-docusate (SENOKOT-S) 8.6-50 MG tablet Take 1 tablet by mouth 2 (two) times daily as needed for mild constipation. 06/21/19   Chase Picket, MD  sertraline (ZOLOFT) 100 MG tablet Take 100 mg by mouth  daily. 10/15/17 06/21/19  [provider]    Family History Family History  Problem Relation Age of Onset  . Hypertension Mother   . Diabetes Mother   . Heart disease Mother   . Heart failure Mother   . Cancer Father        throat    Social History Social History   Tobacco Use  . Smoking status: Current Some Day Smoker    Types: Cigarettes  . Smokeless tobacco: Never Used  Substance Use Topics  . Alcohol use: Yes    Comment: socially  . Drug use: No     Allergies   Patient has no known allergies.   Review of Systems Review of Systems  Constitutional: Negative for activity change, chills, fatigue and fever.  HENT: Negative.   Cardiovascular: Negative for chest pain and palpitations.  Gastrointestinal: Positive for abdominal pain. Negative for diarrhea, nausea and vomiting.  Genitourinary: Positive for dysuria. Negative for frequency and urgency.  Musculoskeletal: Positive for back pain. Negative for joint swelling and neck pain.  Skin: Negative.   Neurological: Negative for dizziness, light-headedness and headaches.  Psychiatric/Behavioral: Negative for confusion and decreased concentration.     Physical Exam Triage Vital Signs ED Triage Vitals  Enc Vitals Group     BP 06/21/19 0838 (!) 161/78     Pulse Rate 06/21/19 0838 76  Resp 06/21/19 0838 16     Temp 06/21/19 0838 99.1 F (37.3 C)     Temp src --      SpO2 06/21/19 0838 99 %     Weight --      Height --      Head Circumference --      Peak Flow --      Pain Score 06/21/19 0836 7     Pain Loc --      Pain Edu? --      Excl. in [place]? --    No data found.  Updated Vital Signs BP (!) 161/78 (BP Location: Left Arm)   Pulse 76   Temp 99.1 F (37.3 C)   Resp 16   LMP 06/01/2019 (Exact Date) Comment: nexplanon placed 2018, and esure BC  SpO2 99%   Visual Acuity Right Eye Distance:   Left Eye Distance:   Bilateral Distance:    Right Eye Near:   Left Eye Near:    Bilateral Near:      Physical Exam Vitals and nursing note reviewed.  Constitutional:      Appearance: She is well-developed. She is not ill-appearing or toxic-appearing.  Cardiovascular:     Rate and Rhythm: Normal rate and regular rhythm.  Pulmonary:     Effort: Pulmonary effort is normal.     Breath sounds: Normal breath sounds. No stridor. No wheezing or rhonchi.  Abdominal:     General: Bowel sounds are normal. There is no distension or abdominal bruit.     Palpations: Abdomen is rigid. There is no shifting dullness, hepatomegaly or splenomegaly.     Tenderness: There is abdominal tenderness in the right lower quadrant and suprapubic area. There is no right CVA tenderness, left CVA tenderness or guarding. Negative signs include McBurney's sign and psoas sign.     Hernia: No hernia is present.  Skin:    General: Skin is warm.     Capillary Refill: Capillary refill takes less than 2 seconds.     Coloration: Skin is not mottled or pale.     Findings: No rash.  Neurological:     General: No focal deficit present.     Mental Status: She is alert and oriented to person, place, and time.      UC Treatments / Results  Labs (all labs ordered are listed, but only abnormal results are displayed) Labs Reviewed  COMPREHENSIVE METABOLIC PANEL - Abnormal; Notable for the following components:      Result Value   Calcium 8.8 (*)    All other components within normal limits  POCT URINALYSIS DIP (DEVICE)  POC URINE PREG, ED  POCT PREGNANCY, URINE    EKG   Radiology DG Abdomen 1 View  Result Date: 06/21/2019 CLINICAL DATA:  39 year old female with right lower quadrant abdominal pain radiating into the lower back. EXAM: ABDOMEN - 1 VIEW COMPARISON:  None. FINDINGS: The bowel gas pattern is normal. Linear metallic foreign body projects over the left hemipelvis. This likely represents a fallopian tube occlusion device. No radio-opaque calculi or other significant radiographic abnormality are seen.  IMPRESSION: Negative. Note is made of a fallopian tube occlusion device on the left. Electronically Signed   By: Malachy Moan M.D.   On: 06/21/2019 09:43    Procedures Procedures (including critical care time)  Medications Ordered in UC Medications - No data to display  Initial Impression / Assessment and Plan / UC Course  I have reviewed the triage vital signs  and the nursing notes.  Pertinent labs & imaging results that were available during my care of the patient were reviewed by me and considered in my medical decision making (see chart for details).     1.  Constipation: Senokot 1 tablet twice daily as needed for constipation KUB is negative for any acute intra-abdominal process Moderate amount of stool in the right colon.  2.  Musculoskeletal back pain: Heating pad Ibuprofen 600 mg every 6 hours as needed Flexeril 10 mg twice daily as needed Gentle range of motion exercises Final Clinical Impressions(s) / UC Diagnoses   Final diagnoses:  Constipation, unspecified constipation type  Musculoskeletal back pain   Discharge Instructions   None    ED Prescriptions    Medication Sig Dispense Auth. Provider   ibuprofen (ADVIL) 600 MG tablet Take 1 tablet (600 mg total) by mouth every 6 (six) hours as needed. 30 tablet Mahmud Keithly, Britta Mccreedy, MD   cyclobenzaprine (FLEXERIL) 10 MG tablet Take 1 tablet (10 mg total) by mouth 2 (two) times daily as needed for muscle spasms. 20 tablet Deatrice Spanbauer, Britta Mccreedy, MD   senna-docusate (SENOKOT-S) 8.6-50 MG tablet Take 1 tablet by mouth 2 (two) times daily as needed for mild constipation. 30 tablet Jenney Brester, Britta Mccreedy, MD     PDMP not reviewed this encounter.   Merrilee Jansky, MD 06/21/19 [phone]

## 2019-06-21 NOTE — ED Triage Notes (Signed)
Patient presents to Urgent Care with complaints of right sided abdominal pain that radiates from her flank around to her lower abdomen since two days ago. Patient reports she has been taking different otc meds to address the pain but nothing has helped, endorses frequent urination.

## 2019-09-20 ENCOUNTER — Other Ambulatory Visit: Payer: Self-pay

## 2019-09-20 ENCOUNTER — Encounter: Payer: Self-pay | Admitting: Emergency Medicine

## 2019-09-20 ENCOUNTER — Ambulatory Visit
Admission: EM | Admit: 2019-09-20 | Discharge: 2019-09-20 | Disposition: A | Discharge status: Home / Self Care | Payer: Self-pay | Attending: Physician Assistant | Admitting: Physician Assistant

## 2019-09-20 DIAGNOSIS — R350 Frequency of micturition: Secondary | ICD-10-CM

## 2019-09-20 DIAGNOSIS — R3915 Urgency of urination: Secondary | ICD-10-CM

## 2019-09-20 LAB — POCT URINALYSIS DIP (MANUAL ENTRY)
Bilirubin, UA: NEGATIVE
Blood, UA: NEGATIVE
Glucose, UA: NEGATIVE mg/dL
Ketones, POC UA: NEGATIVE mg/dL
Leukocytes, UA: NEGATIVE
Nitrite, UA: NEGATIVE
Protein Ur, POC: NEGATIVE mg/dL
Spec Grav, UA: 1.01 (ref 1.010–1.025)
Urobilinogen, UA: 0.2 E.U./dL
pH, UA: 6.5 (ref 5.0–8.0)

## 2019-09-20 LAB — POCT URINE PREGNANCY: Preg Test, Ur: NEGATIVE

## 2019-09-20 NOTE — Discharge Instructions (Signed)
Your urine was negative for pregnancy, infection. At this time, decrease caffeine intake. Try to drink water slowly throughout the day. Follow up with PCP for further evaluation and management needed.

## 2019-09-20 NOTE — ED Triage Notes (Signed)
December 2020 noticed an ammonia smell to urine and frequent urination and left lower back pain.  Symptoms have been intermittent since first noticing symptoms.  Now, left back pain and frequent urination has increased

## 2019-09-20 NOTE — ED Provider Notes (Signed)
[facility]    CSN: [id number] Arrival date & time: 09/20/19  3016      History   Chief Complaint Chief Complaint  Patient presents with  . Urinary Tract Infection    HPI Katelyn Soto is a 39 y.o. female.   39 year old female comes in for 1 week  history of urinary symptoms.  She has had similar symptoms intermittently for the past 4 months, but states has been more constant this past week.  Has had urinary urgency and urinary frequency. No dysuria, hematuria. Has had left back pain that is intermittent, not specifically associated with urination.  Denies radiation of pain.  Has had nausea without vomiting.  Denies abdominal pain.  Denies fever, chills.  Mild vaginal itching.  Denies vaginal discharge, spotting.  She had essure tubal ligation 11 years ago, states this failed and had pregnancy after and therefore has nexplanon.  Nexplanon implanted 3 years ago, states expires in November.     Past Medical History:  Diagnosis Date  . Anemia   . Iron deficiency anemia 01/14/2016  . Vitamin D deficiency 01/14/2016    Patient Active Problem List   Diagnosis Date Noted  . Chest tightness 11/14/2017  . Cigarette smoker 11/14/2017  . Iron deficiency anemia 01/14/2016  . Vitamin D deficiency 01/14/2016    Past Surgical History:  Procedure Laterality Date  . ESSURE TUBAL LIGATION      OB History   No obstetric history on file.      Home Medications    Prior to Admission medications   Medication Sig Start Date End Date Taking? Authorizing Provider  cyclobenzaprine (FLEXERIL) 10 MG tablet Take 1 tablet (10 mg total) by mouth 2 (two) times daily as needed for muscle spasms. 06/21/19   Merrilee Jansky, MD  ibuprofen (ADVIL) 600 MG tablet Take 1 tablet (600 mg total) by mouth every 6 (six) hours as needed. 06/21/19   Lamptey, Britta Mccreedy, MD  senna-docusate (SENOKOT-S) 8.6-50 MG tablet Take 1 tablet by mouth 2 (two) times daily as needed for mild constipation. 06/21/19    Merrilee Jansky, MD  sertraline (ZOLOFT) 100 MG tablet Take 100 mg by mouth daily. 10/15/17 06/21/19  [provider]    Family History Family History  Problem Relation Age of Onset  . Hypertension Mother   . Diabetes Mother   . Heart disease Mother   . Heart failure Mother   . Cancer Father        throat    Social History Social History   Tobacco Use  . Smoking status: Never Smoker  . Smokeless tobacco: Never Used  Substance Use Topics  . Alcohol use: Yes    Comment: socially  . Drug use: No     Allergies   Patient has no known allergies.   Review of Systems Review of Systems  Reason unable to perform ROS: See HPI as above.     Physical Exam Triage Vital Signs ED Triage Vitals  Enc Vitals Group     BP 09/20/19 0852 129/85     Pulse Rate 09/20/19 0852 79     Resp 09/20/19 0852 16     Temp 09/20/19 0852 98.3 F (36.8 C)     Temp Source 09/20/19 0852 Oral     SpO2 09/20/19 0852 97 %     Weight --      Height --      Head Circumference --      Peak  Flow --      Pain Score 09/20/19 0931 5     Pain Loc --      Pain Edu? --      Excl. in [place]? --    No data found.  Updated Vital Signs BP 129/85 (BP Location: Left Arm)   Pulse 79   Temp 98.3 F (36.8 C) (Oral)   Resp 16   SpO2 97%   Physical Exam Constitutional:      General: She is not in acute distress.    Appearance: She is well-developed. She is not ill-appearing, toxic-appearing or diaphoretic.  HENT:     Head: Normocephalic and atraumatic.  Eyes:     Conjunctiva/sclera: Conjunctivae normal.     Pupils: Pupils are equal, round, and reactive to light.  Cardiovascular:     Rate and Rhythm: Normal rate and regular rhythm.  Pulmonary:     Effort: Pulmonary effort is normal. No respiratory distress.     Comments: LCTAB Abdominal:     General: Bowel sounds are normal.     Palpations: Abdomen is soft.     Tenderness: There is no abdominal tenderness. There is no right CVA  tenderness, left CVA tenderness, guarding or rebound.  Musculoskeletal:     Cervical back: Normal range of motion and neck supple.     Comments: No swelling, erythema, contusion.  No tenderness to palpation of spinous processes.  Diffuse tenderness to palpation of left lumbar region.  Full range of motion of back and hips.  Skin:    General: Skin is warm and dry.  Neurological:     Mental Status: She is alert and oriented to person, place, and time.  Psychiatric:        Behavior: Behavior normal.        Judgment: Judgment normal.      UC Treatments / Results  Labs (all labs ordered are listed, but only abnormal results are displayed) Labs Reviewed  POCT URINALYSIS DIP (MANUAL ENTRY)  POCT URINE PREGNANCY    EKG   Radiology No results found.  Procedures Procedures (including critical care time)  Medications Ordered in UC Medications - No data to display  Initial Impression / Assessment and Plan / UC Course  I have reviewed the triage vital signs and the nursing notes.  Pertinent labs & imaging results that were available during my care of the patient were reviewed by me and considered in my medical decision making (see chart for details).    Urine negative for infection, pregnancy.  Urine negative for glucose, low suspicion for diabetes causing symptoms as well.  Discussed left back pain more likely MSK pain.  Can take NSAIDs for now.  Decreased caffeine intake.  To follow-up with PCP for further evaluation if symptoms not improving.  Return precautions given.  Patient expresses understanding and agrees to plan.  Final Clinical Impressions(s) / UC Diagnoses   Final diagnoses:  Urinary urgency  Urinary frequency    ED Prescriptions    None     PDMP not reviewed this encounter.   Ok Edwards, PA-C 09/20/19 1006

## 2020-01-23 ENCOUNTER — Ambulatory Visit
Admission: EM | Admit: 2020-01-23 | Discharge: 2020-01-23 | Disposition: A | Discharge status: Home / Self Care | Payer: Self-pay

## 2020-01-23 ENCOUNTER — Other Ambulatory Visit: Payer: Self-pay

## 2020-01-23 ENCOUNTER — Encounter: Payer: Self-pay | Admitting: Emergency Medicine

## 2020-01-23 DIAGNOSIS — R5383 Other fatigue: Secondary | ICD-10-CM

## 2020-01-23 DIAGNOSIS — G933 Postviral fatigue syndrome: Secondary | ICD-10-CM

## 2020-01-23 DIAGNOSIS — G9331 Postviral fatigue syndrome: Secondary | ICD-10-CM

## 2020-01-23 NOTE — Discharge Instructions (Addendum)
Important to follow up with cardiology, PCP for further evaluation. Go to ER for severe weakness, chest pain, shortness of breath, leg swelling.

## 2020-01-23 NOTE — ED Provider Notes (Signed)
[facility]    CSN: [id number] Arrival date & time: 01/23/20  4098      History   Chief Complaint Chief Complaint  Patient presents with  . Headache  . Fatigue    HPI Katelyn Soto is a 39 y.o. female with history of Covid presenting for persistent generalized headaches, fatigue, decreased appetite, dyspnea with exertion.  States this is been ongoing/stable since Covid infection 2 months ago.  Denies chest pain, palpitations, shortness of breath, leg swelling, nausea, vomiting, fever.  Does not primary care currently.    Past Medical History:  Diagnosis Date  . Anemia   . Iron deficiency anemia 01/14/2016  . Vitamin D deficiency 01/14/2016    Patient Active Problem List   Diagnosis Date Noted  . Chest tightness 11/14/2017  . Cigarette smoker 11/14/2017  . Iron deficiency anemia 01/14/2016  . Vitamin D deficiency 01/14/2016    Past Surgical History:  Procedure Laterality Date  . ESSURE TUBAL LIGATION      OB History   No obstetric history on file.      Home Medications    Prior to Admission medications   Medication Sig Start Date End Date Taking? Authorizing Provider  ibuprofen (ADVIL) 600 MG tablet Take 1 tablet (600 mg total) by mouth every 6 (six) hours as needed. 06/21/19   Lamptey, Britta Mccreedy, MD  senna-docusate (SENOKOT-S) 8.6-50 MG tablet Take 1 tablet by mouth 2 (two) times daily as needed for mild constipation. 06/21/19   Merrilee Jansky, MD  sertraline (ZOLOFT) 100 MG tablet Take 100 mg by mouth daily. 10/15/17 06/21/19  [provider]    Family History Family History  Problem Relation Age of Onset  . Hypertension Mother   . Diabetes Mother   . Heart disease Mother   . Heart failure Mother   . Cancer Father        throat    Social History Social History   Tobacco Use  . Smoking status: Never Smoker  . Smokeless tobacco: Never Used  Vaping Use  . Vaping Use: Never used  Substance Use Topics  . Alcohol use: Yes     Comment: socially  . Drug use: No     Allergies   Patient has no known allergies.   Review of Systems As per HPI  Physical Exam Triage Vital Signs ED Triage Vitals  Enc Vitals Group     BP 01/23/20 0859 123/75     Pulse Rate 01/23/20 0859 90     Resp 01/23/20 0859 16     Temp 01/23/20 0859 98 F (36.7 C)     Temp src --      SpO2 01/23/20 0859 97 %     Weight --      Height --      Head Circumference --      Peak Flow --      Pain Score 01/23/20 0902 4     Pain Loc --      Pain Edu? --      Excl. in [place]? --    No data found.  Updated Vital Signs BP 123/75 (BP Location: Left Arm)   Pulse 90   Temp 98 F (36.7 C)   Resp 16   SpO2 97%   Visual Acuity Right Eye Distance:   Left Eye Distance:   Bilateral Distance:    Right Eye Near:   Left Eye Near:    Bilateral Near:     Physical  Exam Vitals reviewed.  Constitutional:      General: She is not in acute distress.    Appearance: She is normal weight. She is not ill-appearing or diaphoretic.  HENT:     Head: Normocephalic and atraumatic.     Mouth/Throat:     Mouth: Mucous membranes are moist.     Pharynx: Oropharynx is clear. No oropharyngeal exudate or posterior oropharyngeal erythema.  Eyes:     General: No scleral icterus.       Right eye: No discharge.        Left eye: No discharge.     Extraocular Movements: Extraocular movements intact.     Conjunctiva/sclera: Conjunctivae normal.     Pupils: Pupils are equal, round, and reactive to light.  Neck:     Comments: Trachea midline, negative JVD Cardiovascular:     Rate and Rhythm: Normal rate and regular rhythm.     Pulses: Normal pulses.     Heart sounds: No murmur heard.  No gallop.   Pulmonary:     Effort: Pulmonary effort is normal. No respiratory distress.     Breath sounds: No wheezing, rhonchi or rales.  Chest:     Chest wall: No tenderness.  Abdominal:     General: Abdomen is flat.     Palpations: Abdomen is soft.     Tenderness:  There is no abdominal tenderness. There is no guarding.  Musculoskeletal:        General: No tenderness. Normal range of motion.     Cervical back: Normal range of motion and neck supple. No tenderness. No muscular tenderness.     Right lower leg: No edema.     Left lower leg: No edema.  Lymphadenopathy:     Cervical: No cervical adenopathy.  Skin:    General: Skin is warm.     Capillary Refill: Capillary refill takes less than 2 seconds.     Coloration: Skin is not jaundiced or pale.     Findings: No rash.  Neurological:     General: No focal deficit present.     Mental Status: She is alert and oriented to person, place, and time.  Psychiatric:        Mood and Affect: Mood normal.        Thought Content: Thought content normal.      UC Treatments / Results  Labs (all labs ordered are listed, but only abnormal results are displayed) Labs Reviewed - No data to display  EKG   Radiology No results found.  Procedures Procedures (including critical care time)  Medications Ordered in UC Medications - No data to display  Initial Impression / Assessment and Plan / UC Course  I have reviewed the triage vital signs and the nursing notes.  Pertinent labs & imaging results that were available during my care of the patient were reviewed by me and considered in my medical decision making (see chart for details).     Patient febrile, nontoxic, hemodynamically stable and with good oxygenation.  No alarm signs of symptoms as mentioned in HPI/PE.  Low concern for acute heart failure, infection.  Provided contact information for both cardiology, PCP for further evaluation as needed.  Return precautions discussed, pt verbalized understanding and is agreeable to plan. Final Clinical Impressions(s) / UC Diagnoses   Final diagnoses:  Post viral syndrome  Other fatigue     Discharge Instructions     Important to follow up with cardiology, PCP for further evaluation. Go to ER  for  severe weakness, chest pain, shortness of breath, leg swelling.    ED Prescriptions    None     PDMP not reviewed this encounter.   [place], [place], [place] 01/23/20 1017

## 2020-01-23 NOTE — ED Triage Notes (Signed)
Pt sts increased HA, fatigue, no appetite; inability to walk far without having to rest x 2 months since having covid in June

## 2020-02-09 ENCOUNTER — Ambulatory Visit (INDEPENDENT_AMBULATORY_CARE_PROVIDER_SITE_OTHER): Payer: Self-pay | Admitting: Nurse Practitioner

## 2020-02-09 ENCOUNTER — Ambulatory Visit
Admission: RE | Admit: 2020-02-09 | Discharge: 2020-02-09 | Disposition: A | Discharge status: Home / Self Care | Payer: No Typology Code available for payment source | Source: Ambulatory Visit | Attending: Nurse Practitioner | Admitting: Nurse Practitioner

## 2020-02-09 ENCOUNTER — Other Ambulatory Visit: Payer: Self-pay

## 2020-02-09 VITALS — BP 128/88 | HR 130 | Temp 98.6°F | Ht 64.0 in | Wt 173.0 lb

## 2020-02-09 DIAGNOSIS — R0602 Shortness of breath: Secondary | ICD-10-CM

## 2020-02-09 DIAGNOSIS — D649 Anemia, unspecified: Secondary | ICD-10-CM | POA: Insufficient documentation

## 2020-02-09 DIAGNOSIS — Z8616 Personal history of COVID-19: Secondary | ICD-10-CM

## 2020-02-09 DIAGNOSIS — R000 Tachycardia, unspecified: Secondary | ICD-10-CM

## 2020-02-09 MED ORDER — HYDROXYZINE PAMOATE 25 MG PO CAPS: 25.0000 mg | ORAL_CAPSULE | Freq: Three times a day (TID) | ORAL | 0 refills | Status: DC | PRN

## 2020-02-09 NOTE — Progress Notes (Signed)
@Patient  ID: , female    DOB: Oct 19, 1980, 39 y.o.   MRN: [id number]  Chief Complaint  Patient presents with  . Covid Positive    Positive 10/2019. Sx: Rapid heart beat, SOB, Rash, taste and smell comes and goes.     Referring provider: 11/2019, MD   39 year old female with history of anemia, vitamin D deficiency. Diagnosed with Covid June 2021.   HPI  Patient presents today for post COVID care clinic visit.  She was diagnosed with Covid in June 2021.  She states that since this time she has had racing heart beat, shortness of breath, rash, loss of taste and smell.  Patient states that she does have anxiety and feels like at times she is having a panic attack when she is short of breath and her heart is racing.  She does not notice any significant shortness of breath with exertion.  She states that her rash does itch at times and comes and goes.  She states that he is usually on her chest and her back.Denies f/c/s, n/v/d, hemoptysis, PND, chest pain or edema.      No Known Allergies   There is no immunization history on file for this patient.  Past Medical History:  Diagnosis Date  . Anemia   . Chest tightness 11/14/2017  . Cigarette smoker 11/14/2017  . Iron deficiency anemia 01/14/2016  . Vitamin D deficiency 01/14/2016    Tobacco History: Social History   Tobacco Use  Smoking Status Never Smoker  Smokeless Tobacco Never Used   Counseling given: Yes   Outpatient Encounter Medications as of 02/09/2020  Medication Sig  . ibuprofen (ADVIL) 600 MG tablet Take 1 tablet (600 mg total) by mouth every 6 (six) hours as needed.  . hydrOXYzine (VISTARIL) 25 MG capsule Take 1 capsule (25 mg total) by mouth 3 (three) times daily as needed.  . senna-docusate (SENOKOT-S) 8.6-50 MG tablet Take 1 tablet by mouth 2 (two) times daily as needed for mild constipation. (Patient not taking: Reported on 02/09/2020)  . [DISCONTINUED] sertraline (ZOLOFT) 100 MG tablet Take 100  mg by mouth daily.   No facility-administered encounter medications on file as of 02/09/2020.     Review of Systems  Review of Systems  Constitutional: Negative.  Negative for fever.  HENT: Negative.   Respiratory: Positive for shortness of breath. Negative for cough.   Cardiovascular: Positive for palpitations.  Gastrointestinal: Negative.   Allergic/Immunologic: Negative.   Neurological: Negative.        Loss of taste and smell  Psychiatric/Behavioral: Negative.        Physical Exam  BP 128/88 (BP Location: Right Arm)   Pulse (!) 130   Temp 98.6 F (37 C)   Ht 5\' 4"  (1.626 m)   Wt 173 lb 0.1 oz (78.5 kg)   SpO2 98%   BMI 29.70 kg/m   Wt Readings from Last 5 Encounters:  02/09/20 173 lb 0.1 oz (78.5 kg)  11/14/17 188 lb (85.3 kg)  07/29/17 185 lb (83.9 kg)  04/14/17 180 lb (81.6 kg)     Physical Exam Vitals and nursing note reviewed.  Constitutional:      General: She is not in acute distress.    Appearance: She is well-developed.  Cardiovascular:     Rate and Rhythm: Regular rhythm. Tachycardia present.  Pulmonary:     Effort: Pulmonary effort is normal.     Breath sounds: Normal breath sounds.  Musculoskeletal:  Right lower leg: No edema.     Left lower leg: No edema.  Neurological:     Mental Status: She is alert and oriented to person, place, and time.  Psychiatric:        Mood and Affect: Mood normal.        Behavior: Behavior normal.       Imaging: DG Chest 2 View  Result Date: 02/10/2020 CLINICAL DATA:  Persistent shortness of breath, COVID [id number] EXAM: CHEST - 2 VIEW COMPARISON:  05/22/2015 FINDINGS: The heart size and mediastinal contours are within normal limits. Both lungs are clear. The visualized skeletal structures are unremarkable. IMPRESSION: No active cardiopulmonary disease. Electronically Signed   By: Sharlet Salina M.D.   On: 02/10/2020 00:27     Assessment & Plan:   History of COVID-19 Shortness of  breath Tachycardia:  EKG showed: sinus tachycardia  Patient walked in office today O2 sats remained above 98% heart rate was tachy at 145 BPM  Stay active  Stay well hydrated  May start vitamin C, zinc, vitamin D3  Will check labs  Please avoid caffeine   Will place referral to cardiology  Headache:  May take tylenol as needed  May start magnesium  Loss of taste and Smell:  Handout given on [employer] olfactory re-training program  Anxiety:  Will order vistaril to take as needed  Rash:  May use benadryl cream  May start zyrtec daily  Follow up:  Follow up in 1 month or sooner if needed  Will make appointment to establish care with PCP      Ivonne Andrew, NP 02/10/2020

## 2020-02-09 NOTE — Patient Instructions (Addendum)
History of Covid Shortness of breath Tachycardia:  EKG showed: sinus tachycardia  Patient walked in office today O2 sats remained above 98% heart rate was tachy at 145 BPM  Stay active  Stay well hydrated  May start vitamin C, zinc, vitamin D3  Will check labs  Please avoid caffeine   Will place referral to cardiology  Headache:  May take tylenol as needed  May start magnesium  Loss of taste and Smell:  Handout given on [employer] olfactory re-training program  Anxiety:  Will order vistaril to take as needed  Rash:  May use benadryl cream  May start zyrtec daily  Follow up:  Follow up in 1 month or sooner if needed  Will make appointment to establish care with PCP

## 2020-02-10 ENCOUNTER — Ambulatory Visit (INDEPENDENT_AMBULATORY_CARE_PROVIDER_SITE_OTHER): Payer: Self-pay

## 2020-02-10 ENCOUNTER — Encounter: Payer: Self-pay | Admitting: Cardiology

## 2020-02-10 ENCOUNTER — Ambulatory Visit (INDEPENDENT_AMBULATORY_CARE_PROVIDER_SITE_OTHER): Payer: No Typology Code available for payment source | Admitting: Cardiology

## 2020-02-10 VITALS — BP 128/68 | HR 100 | Ht 64.0 in | Wt 174.8 lb

## 2020-02-10 DIAGNOSIS — Z8616 Personal history of COVID-19: Secondary | ICD-10-CM

## 2020-02-10 DIAGNOSIS — R0602 Shortness of breath: Secondary | ICD-10-CM | POA: Insufficient documentation

## 2020-02-10 DIAGNOSIS — R000 Tachycardia, unspecified: Secondary | ICD-10-CM | POA: Insufficient documentation

## 2020-02-10 DIAGNOSIS — R0789 Other chest pain: Secondary | ICD-10-CM

## 2020-02-10 DIAGNOSIS — R002 Palpitations: Secondary | ICD-10-CM

## 2020-02-10 DIAGNOSIS — R072 Precordial pain: Secondary | ICD-10-CM

## 2020-02-10 DIAGNOSIS — R011 Cardiac murmur, unspecified: Secondary | ICD-10-CM

## 2020-02-10 HISTORY — DX: Tachycardia, unspecified: R00.0

## 2020-02-10 HISTORY — DX: Palpitations: R00.2

## 2020-02-10 HISTORY — DX: Shortness of breath: R06.02

## 2020-02-10 HISTORY — DX: Cardiac murmur, unspecified: R01.1

## 2020-02-10 HISTORY — DX: Personal history of COVID-19: Z86.16

## 2020-02-10 LAB — CBC
Hematocrit: 35.4 % (ref 34.0–46.6)
Hemoglobin: 11.4 g/dL (ref 11.1–15.9)
MCH: 22.4 pg — ABNORMAL LOW (ref 26.6–33.0)
MCHC: 32.2 g/dL (ref 31.5–35.7)
MCV: 69 fL — ABNORMAL LOW (ref 79–97)
Platelets: 185 10*3/uL (ref 150–450)
RBC: 5.1 x10E6/uL (ref 3.77–5.28)
RDW: 13.5 % (ref 11.7–15.4)
WBC: 6.9 10*3/uL (ref 3.4–10.8)

## 2020-02-10 LAB — COMPREHENSIVE METABOLIC PANEL
ALT: 42 IU/L — ABNORMAL HIGH (ref 0–32)
AST: 23 IU/L (ref 0–40)
Albumin/Globulin Ratio: 1.4 (ref 1.2–2.2)
Albumin: 3.5 g/dL — ABNORMAL LOW (ref 3.8–4.8)
Alkaline Phosphatase: 70 IU/L (ref 44–121)
BUN/Creatinine Ratio: 24 — ABNORMAL HIGH (ref 9–23)
BUN: 13 mg/dL (ref 6–24)
Bilirubin Total: 0.3 mg/dL (ref 0.0–1.2)
CO2: 22 mmol/L (ref 20–29)
Calcium: 9.2 mg/dL (ref 8.7–10.2)
Chloride: 107 mmol/L — ABNORMAL HIGH (ref 96–106)
Creatinine, Ser: 0.55 mg/dL — ABNORMAL LOW (ref 0.57–1.00)
GFR calc Af Amer: 135 mL/min/{1.73_m2} (ref >59)
GFR calc non Af Amer: 117 mL/min/{1.73_m2} (ref >59)
Globulin, Total: 2.5 g/dL (ref 1.5–4.5)
Glucose: 106 mg/dL — ABNORMAL HIGH (ref 65–99)
Potassium: 4.6 mmol/L (ref 3.5–5.2)
Sodium: 140 mmol/L (ref 134–144)
Total Protein: 6 g/dL (ref 6.0–8.5)

## 2020-02-10 LAB — TSH: TSH: <0.005 u[IU]/mL — ABNORMAL LOW (ref 0.450–4.500)

## 2020-02-10 LAB — D-DIMER, QUANTITATIVE: D-DIMER: 0.3 mg/L FEU (ref 0.00–0.49)

## 2020-02-10 MED ORDER — METOPROLOL SUCCINATE ER 25 MG PO TB24: 25.0000 mg | ORAL_TABLET | Freq: Every day | ORAL | 6 refills | Status: DC

## 2020-02-10 NOTE — Progress Notes (Signed)
Cardiology Office Note:    Date:  02/10/2020   ID:  HERO MCCATHERN, DOB 1980-07-10, MRN [id number]  PCP:  Noland Fordyce, MD  Cardiologist:  Garwin Brothers, MD   Referring MD: Noland Fordyce, MD    ASSESSMENT:    1. Chest tightness   2. History of COVID-19   3. Tachycardia   4. Cardiac murmur   5. Palpitations    PLAN:    In order of problems listed above:  1. Primary prevention stressed with the patient.  Importance of compliance with diet medication stressed she vocalized understanding. 2. Chest discomfort: She has multiple risk factors for coronary artery disease.  She needs stress testing we will do a Lexiscan sestamibi.  We will need to check her for pregnancy before that test. 3. Cardiac murmur: Echocardiogram will be done to assess murmur heard on auscultation. 4. Tachycardia and palpitations: We will do TSH today.  She will undergo 2-week monitoring to assess this. 5. Records from primary care provider's office and lab work from yesterday was reviewed.  D-dimer is unremarkable.  She knows to go to the nearest emergency room for any concerning symptoms. 6. Patient will be seen in follow-up appointment in 6 weeks or earlier if the patient has any concerns    Medication Adjustments/Labs and Tests Ordered: Current medicines are reviewed at length with the patient today.  Concerns regarding medicines are outlined above.  No orders of the defined types were placed in this encounter.  No orders of the defined types were placed in this encounter.    No chief complaint on file.    History of Present Illness:    Katelyn Soto is a 39 y.o. female.  Patient has history of COVID-19 pneumonitis.  She went to the post Covid clinic.  She had blood work yesterday.  She is referred here for tachycardia.  Patient mentions to me that she feels the palpitations.  She occasionally has chest discomfort also.  No orthopnea or PND.  She is an ex-smoker.  At the time of my evaluation, the  patient is alert awake oriented and in no distress.  Past Medical History:  Diagnosis Date  . Anemia   . Chest tightness 11/14/2017  . Cigarette smoker 11/14/2017  . Iron deficiency anemia 01/14/2016  . Vitamin D deficiency 01/14/2016    Past Surgical History:  Procedure Laterality Date  . ESSURE TUBAL LIGATION      Current Medications: Current Meds  Medication Sig  . hydrOXYzine (VISTARIL) 25 MG capsule Take 1 capsule (25 mg total) by mouth 3 (three) times daily as needed.  Marland Kitchen ibuprofen (ADVIL) 600 MG tablet Take 1 tablet (600 mg total) by mouth every 6 (six) hours as needed.     Allergies:   Patient has no known allergies.   Social History   Socioeconomic History  . Marital status: Married    Spouse name: Not on file  . Number of children: Not on file  . Years of education: Not on file  . Highest education level: Not on file  Occupational History  . Not on file  Tobacco Use  . Smoking status: Never Smoker  . Smokeless tobacco: Never Used  Vaping Use  . Vaping Use: Never used  Substance and Sexual Activity  . Alcohol use: Yes    Comment: socially  . Drug use: No  . Sexual activity: Not on file  Other Topics Concern  . Not on file  Social History Narrative  . Not  on file   Social Determinants of Health   Financial Resource Strain:   . Difficulty of Paying Living Expenses: Not on file  Food Insecurity:   . Worried About Programme researcher, broadcasting/film/video in the Last Year: Not on file  . Ran Out of Food in the Last Year: Not on file  Transportation Needs:   . Lack of Transportation (Medical): Not on file  . Lack of Transportation (Non-Medical): Not on file  Physical Activity:   . Days of Exercise per Week: Not on file  . Minutes of Exercise per Session: Not on file  Stress:   . Feeling of Stress : Not on file  Social Connections:   . Frequency of Communication with Friends and Family: Not on file  . Frequency of Social Gatherings with Friends and Family: Not on file  .  Attends Religious Services: Not on file  . Active Member of Clubs or Organizations: Not on file  . Attends Banker Meetings: Not on file  . Marital Status: Not on file     Family History: The patient's family history includes Cancer in her father; Diabetes in her mother; Heart disease in her mother; Heart failure in her mother; Hypertension in her mother.  ROS:   Please see the history of present illness.    All other systems reviewed and are negative.  EKGs/Labs/Other Studies Reviewed:    The following studies were reviewed today: I discussed my findings with the patient in extensive length.  EKG reveals sinus rhythm and nonspecific ST-T changes.   Recent Labs: 02/09/2020: ALT 42; BUN 13; Creatinine, Ser 0.55; Hemoglobin 11.4; Platelets 185; Potassium 4.6; Sodium 140  Recent Lipid Panel    Component Value Date/Time   CHOL 166 11/14/2017 1015   TRIG 55 11/14/2017 1015   HDL 40 11/14/2017 1015   CHOLHDL 4.2 11/14/2017 1015   LDLCALC 115 (H) 11/14/2017 1015    Physical Exam:    VS:  BP 128/68   Pulse 100   Ht 5\' 4"  (1.626 m)   Wt 174 lb 12.8 oz (79.3 kg)   SpO2 96%   BMI 30.00 kg/m     Wt Readings from Last 3 Encounters:  02/10/20 174 lb 12.8 oz (79.3 kg)  02/09/20 173 lb 0.1 oz (78.5 kg)  11/14/17 188 lb (85.3 kg)     GEN: Patient is in no acute distress HEENT: Normal NECK: No JVD; No carotid bruits LYMPHATICS: No lymphadenopathy CARDIAC: Hear sounds regular, 2/6 systolic murmur at the apex. RESPIRATORY:  Clear to auscultation without rales, wheezing or rhonchi  ABDOMEN: Soft, non-tender, non-distended MUSCULOSKELETAL:  No edema; No deformity  SKIN: Warm and dry NEUROLOGIC:  Alert and oriented x 3 PSYCHIATRIC:  Normal affect   Signed, 11/16/17, MD  02/10/2020 9:44 AM    [facility]

## 2020-02-10 NOTE — Assessment & Plan Note (Signed)
Shortness of breath Tachycardia:  EKG showed: sinus tachycardia  Patient walked in office today O2 sats remained above 98% heart rate was tachy at 145 BPM  Stay active  Stay well hydrated  May start vitamin C, zinc, vitamin D3  Will check labs  Please avoid caffeine   Will place referral to cardiology  Headache:  May take tylenol as needed  May start magnesium  Loss of taste and Smell:  Handout given on [employer] olfactory re-training program  Anxiety:  Will order vistaril to take as needed  Rash:  May use benadryl cream  May start zyrtec daily  Follow up:  Follow up in 1 month or sooner if needed  Will make appointment to establish care with PCP

## 2020-02-10 NOTE — Patient Instructions (Signed)
Medication Instructions:  Your physician has recommended you make the following change in your medication:   Take Toprol XL (metoprolol succinate) 25 mg daily.  *If you need a refill on your cardiac medications before your next appointment, please call your pharmacy*   Lab Work: We done a TSH today in the office. If you have labs (blood work) drawn today and your tests are completely normal, you will receive your results only by:  MyChart Message (if you have MyChart) OR  A paper copy in the mail If you have any lab test that is abnormal or we need to change your treatment, we will call you to review the results.   Testing/Procedures: Your physician has requested that you have an echocardiogram. Echocardiography is a painless test that uses sound waves to create images of your heart. It provides your doctor with information about the size and shape of your heart and how well your hearts chambers and valves are working. This procedure takes approximately one hour. There are no restrictions for this procedure.  Your physician has requested that you have a lexiscan myoview. For further information please visit [email or web address] Please follow instruction sheet, as given.  The test will take approximately 3 to 4 hours to complete; you may bring reading material.  If someone comes with you to your appointment, they will need to remain in the main lobby due to limited space in the testing area. **If you are pregnant or breastfeeding, please notify the nuclear lab prior to your appointment**  How to prepare for your Myocardial Perfusion Test:  Do not eat or drink 3 hours prior to your test, except you may have water.  Do not consume products containing caffeine (regular or decaffeinated) 12 hours prior to your test. (ex: coffee, chocolate, sodas, tea).  Do bring a list of your current medications with you.  If not listed below, you may take your medications as normal.  Do wear  comfortable clothes (no dresses or overalls) and walking shoes, tennis shoes preferred (No heels or open toe shoes are allowed).  Do NOT wear cologne, perfume, aftershave, or lotions (deodorant is allowed).  If these instructions are not followed, your test will have to be rescheduled.  WHY IS MY DOCTOR PRESCRIBING ZIO? The Zio system is proven and trusted by physicians to detect and diagnose irregular heart rhythms -- and has been prescribed to hundreds of thousands of patients.  The FDA has cleared the Zio system to monitor for many different kinds of irregular heart rhythms. In a study, physicians were able to reach a diagnosis 90% of the time with the Zio system1.  You can wear the Zio monitor -- a small, discreet, comfortable patch -- during your normal day-to-day activity, including while you sleep, shower, and exercise, while it records every single heartbeat for analysis.  1Barrett, P., et al. Comparison of 24 Hour Holter Monitoring Versus 14 Day Novel Adhesive Patch Electrocardiographic Monitoring. American Journal of Medicine, 2014.  ZIO VS. HOLTER MONITORING The Zio monitor can be comfortably worn for up to 14 days. Holter monitors can be worn for 24 to 48 hours, limiting the time to record any irregular heart rhythms you may have. Zio is able to capture data for the 51% of patients who have their first symptom-triggered arrhythmia after 48 hours.1  LIVE WITHOUT RESTRICTIONS The Zio ambulatory cardiac monitor is a small, unobtrusive, and water-resistant patch--you might even forget youre wearing it. The Zio monitor records and stores every beat of your heart,  whether you're sleeping, working out, or showering.  Wear the monitor for 2 weeks, remove 02/24/20.   Follow-Up: At [facility], you and your health needs are our priority.  As part of our continuing mission to provide you with exceptional heart care, we have created designated Provider Care Teams.  These Care Teams  include your primary Cardiologist (physician) and Advanced Practice Providers (APPs -  Physician Assistants and Nurse Practitioners) who all work together to provide you with the care you need, when you need it.  We recommend signing up for the patient portal called "MyChart".  Sign up information is provided on this After Visit Summary.  MyChart is used to connect with patients for Virtual Visits (Telemedicine).  Patients are able to view lab/test results, encounter notes, upcoming appointments, etc.  Non-urgent messages can be sent to your provider as well.   To learn more about what you can do with MyChart, go to [email or web address].    Your next appointment:   6 week(s)  The format for your next appointment:   In Person  Provider:   Belva Crome, MD   Other Instructions  Cardiac Nuclear Scan A cardiac nuclear scan is a test that is done to check the flow of blood to your heart. It is done when you are resting and when you are exercising. The test looks for problems such as:  Not enough blood reaching a portion of the heart.  The heart muscle not working as it should. You may need this test if:  You have heart disease.  You have had lab results that are not normal.  You have had heart surgery or a balloon procedure to open up blocked arteries (angioplasty).  You have chest pain.  You have shortness of breath. In this test, a special dye (tracer) is put into your bloodstream. The tracer will travel to your heart. A camera will then take pictures of your heart to see how the tracer moves through your heart. This test is usually done at a hospital and takes 2-4 hours. Tell a doctor about:  Any allergies you have.  All medicines you are taking, including vitamins, herbs, eye drops, creams, and over-the-counter medicines.  Any problems you or family members have had with anesthetic medicines.  Any blood disorders you have.  Any surgeries you have had.  Any medical  conditions you have.  Whether you are pregnant or may be pregnant. What are the risks? Generally, this is a safe test. However, problems may occur, such as:  Serious chest pain and heart attack. This is only a risk if the stress portion of the test is done.  Rapid heartbeat.  A feeling of warmth in your chest. This feeling usually does not last long.  Allergic reaction to the tracer. What happens before the test?  Ask your doctor about changing or stopping your normal medicines. This is important.  Follow instructions from your doctor about what you cannot eat or drink.  Remove your jewelry on the day of the test. What happens during the test?  An IV tube will be inserted into one of your veins.  Your doctor will give you a small amount of tracer through the IV tube.  You will wait for 20-40 minutes while the tracer moves through your bloodstream.  Your heart will be monitored with an electrocardiogram (ECG).  You will lie down on an exam table.  Pictures of your heart will be taken for about 15-20 minutes.  You may also  have a stress test. For this test, one of these things may be done: ? You will be asked to exercise on a treadmill or a stationary bike. ? You will be given medicines that will make your heart work harder. This is done if you are unable to exercise.  When blood flow to your heart has peaked, a tracer will again be given through the IV tube.  After 20-40 minutes, you will get back on the exam table. More pictures will be taken of your heart.  Depending on the tracer that is used, more pictures may need to be taken 3-4 hours later.  Your IV tube will be removed when the test is over. The test may vary among doctors and hospitals. What happens after the test?  Ask your doctor: ? Whether you can return to your normal schedule, including diet, activities, and medicines. ? Whether you should drink more fluids. This will help to remove the tracer from your  body. Drink enough fluid to keep your pee (urine) pale yellow.  Ask your doctor, or the department that is doing the test: ? When will my results be ready? ? How will I get my results? Summary  A cardiac nuclear scan is a test that is done to check the flow of blood to your heart.  Tell your doctor whether you are pregnant or may be pregnant.  Before the test, ask your doctor about changing or stopping your normal medicines. This is important.  Ask your doctor whether you can return to your normal activities. You may be asked to drink more fluids. This information is not intended to replace advice given to you by your health care provider. Make sure you discuss any questions you have with your health care provider. Document Revised: 09/02/2018 Document Reviewed: 10/27/2017 Elsevier Patient Education  2020 [employer].  Echocardiogram An echocardiogram is a procedure that uses painless sound waves (ultrasound) to produce an image of the heart. Images from an echocardiogram can provide important information about:  Signs of coronary artery disease (CAD).  Aneurysm detection. An aneurysm is a weak or damaged part of an artery wall that bulges out from the normal force of blood pumping through the body.  Heart size and shape. Changes in the size or shape of the heart can be associated with certain conditions, including heart failure, aneurysm, and CAD.  Heart muscle function.  Heart valve function.  Signs of a past heart attack.  Fluid buildup around the heart.  Thickening of the heart muscle.  A tumor or infectious growth around the heart valves. Tell a health care provider about:  Any allergies you have.  All medicines you are taking, including vitamins, herbs, eye drops, creams, and over-the-counter medicines.  Any blood disorders you have.  Any surgeries you have had.  Any medical conditions you have.  Whether you are pregnant or may be pregnant. What are the  risks? Generally, this is a safe procedure. However, problems may occur, including:  Allergic reaction to dye (contrast) that may be used during the procedure. What happens before the procedure? No specific preparation is needed. You may eat and drink normally. What happens during the procedure?   An IV tube may be inserted into one of your veins.  You may receive contrast through this tube. A contrast is an injection that improves the quality of the pictures from your heart.  A gel will be applied to your chest.  A wand-like tool (transducer) will be moved over  your chest. The gel will help to transmit the sound waves from the transducer.  The sound waves will harmlessly bounce off of your heart to allow the heart images to be captured in real-time motion. The images will be recorded on a computer. The procedure may vary among health care providers and hospitals. What happens after the procedure?  You may return to your normal, everyday life, including diet, activities, and medicines, unless your health care provider tells you not to do that. Summary  An echocardiogram is a procedure that uses painless sound waves (ultrasound) to produce an image of the heart.  Images from an echocardiogram can provide important information about the size and shape of your heart, heart muscle function, heart valve function, and fluid buildup around your heart.  You do not need to do anything to prepare before this procedure. You may eat and drink normally.  After the echocardiogram is completed, you may return to your normal, everyday life, unless your health care provider tells you not to do that. This information is not intended to replace advice given to you by your health care provider. Make sure you discuss any questions you have with your health care provider. Document Revised: 09/03/2018 Document Reviewed: 06/15/2016 Elsevier Patient Education  2020 Elsevier Inc.  Metoprolol  Extended-Release Tablets What is this medicine? METOPROLOL (me TOE proe lole) is a beta blocker. It decreases the amount of work your heart has to do and helps your heart beat regularly. It treats high blood pressure and/or prevent chest pain (also called angina). It also treats heart failure. This medicine may be used for other purposes; ask your health care provider or pharmacist if you have questions. COMMON BRAND NAME(S): toprol, Toprol XL What should I tell my health care provider before I take this medicine? They need to know if you have any of these conditions:  diabetes  heart or vessel disease like slow heart rate, worsening heart failure, heart block, sick sinus syndrome or Raynaud's disease  kidney disease  liver disease  lung or breathing disease, like asthma or emphysema  pheochromocytoma  thyroid disease  an unusual or allergic reaction to metoprolol, other beta-blockers, medicines, foods, dyes, or preservatives  pregnant or trying to get pregnant  breast-feeding How should I use this medicine? Take this drug by mouth. Take it as directed on the prescription label at the same time every day. Take it with food. You may cut the tablet in half if it is scored (has a line in the middle of it). This may help you swallow the tablet if the whole tablet is too big. Be sure to take both halves. Do not take just one-half of the tablet. Keep taking it unless your health care provider tells you to stop. Talk to your health care provider about the use of this drug in children. While it may be prescribed for children as young as 6 for selected conditions, precautions do apply. Overdosage: If you think you have taken too much of this medicine contact a poison control center or emergency room at once. NOTE: This medicine is only for you. Do not share this medicine with others. What if I miss a dose? If you miss a dose, take it as soon as you can. If it is almost time for your next  dose, take only that dose. Do not take double or extra doses. What may interact with this medicine? This medicine may interact with the following medications:  certain medicines for blood pressure,  heart disease, irregular heart beat  certain medicines for depression, like monoamine oxidase (MAO) inhibitors, fluoxetine, or paroxetine  clonidine  dobutamine  epinephrine  isoproterenol  reserpine This list may not describe all possible interactions. Give your health care provider a list of all the medicines, herbs, non-prescription drugs, or dietary supplements you use. Also tell them if you smoke, drink alcohol, or use illegal drugs. Some items may interact with your medicine. What should I watch for while using this medicine? Visit your doctor or health care professional for regular check ups. Contact your doctor right away if your symptoms worsen. Check your blood pressure and pulse rate regularly. Ask your health care professional what your blood pressure and pulse rate should be, and when you should contact them. You may get drowsy or dizzy. Do not drive, use machinery, or do anything that needs mental alertness until you know how this medicine affects you. Do not sit or stand up quickly, especially if you are an older patient. This reduces the risk of dizzy or fainting spells. Contact your doctor if these symptoms continue. Alcohol may interfere with the effect of this medicine. Avoid alcoholic drinks. This medicine may increase blood sugar. Ask your healthcare provider if changes in diet or medicines are needed if you have diabetes. What side effects may I notice from receiving this medicine? Side effects that you should report to your doctor or health care professional as soon as possible:  allergic reactions like skin rash, itching or hives  cold or numb hands or feet  depression  difficulty breathing  faint  fever with sore throat  irregular heartbeat, chest pain  rapid  weight gain   signs and symptoms of high blood sugar such as being more thirsty or hungry or having to urinate more than normal. You may also feel very tired or have blurry vision.  swollen legs or ankles Side effects that usually do not require medical attention (report to your doctor or health care professional if they continue or are bothersome):  anxiety or nervousness  change in sex drive or performance  dry skin  headache  nightmares or trouble sleeping  short term memory loss  stomach upset or diarrhea This list may not describe all possible side effects. Call your doctor for medical advice about side effects. You may report side effects to FDA at 1-800-FDA-1088. Where should I keep my medicine? Keep out of the reach of children and pets. Store at room temperature between 20 and 25 degrees C (68 and 77 degrees F). Throw away any unused drug after the expiration date. NOTE: This sheet is a summary. It may not cover all possible information. If you have questions about this medicine, talk to your doctor, pharmacist, or health care provider.  2020 Elsevier/Gold Standard (2018-12-24 18:23:00)

## 2020-02-11 ENCOUNTER — Other Ambulatory Visit: Payer: Self-pay

## 2020-02-11 ENCOUNTER — Encounter: Payer: Self-pay | Admitting: Nurse Practitioner

## 2020-02-11 ENCOUNTER — Ambulatory Visit (INDEPENDENT_AMBULATORY_CARE_PROVIDER_SITE_OTHER): Payer: Self-pay | Admitting: Nurse Practitioner

## 2020-02-11 VITALS — BP 137/75 | HR 120 | Temp 98.6°F | Resp 22 | Ht 64.0 in | Wt 174.0 lb

## 2020-02-11 DIAGNOSIS — E0590 Thyrotoxicosis, unspecified without thyrotoxic crisis or storm: Secondary | ICD-10-CM

## 2020-02-11 DIAGNOSIS — R634 Abnormal weight loss: Secondary | ICD-10-CM

## 2020-02-11 MED ORDER — METHIMAZOLE 5 MG PO TABS: 5.0000 mg | ORAL_TABLET | Freq: Three times a day (TID) | ORAL | 2 refills | Status: DC

## 2020-02-11 MED FILL — methIMAzole 5 MG TABS: 5 | 30 days supply | Qty: 90 | Fill #0

## 2020-02-11 NOTE — Patient Instructions (Signed)

## 2020-02-11 NOTE — Addendum Note (Signed)
Addended by: Eleonore Chiquito on: 02/11/2020 11:31 AM   Modules accepted: Orders

## 2020-02-11 NOTE — Progress Notes (Signed)
[facility] Patient [facility] [address] [place], [place]  [address] Phone:  [phone]   Fax:  [phone] .   New Patient Office Visit  Subjective:  Patient ID: Katelyn Soto, female    DOB: [birth year]  Age: 39 y.o. MRN: [id number]  CC:  Chief Complaint  Patient presents with  . Establish Care    patient had COVID in June of 2021.   Was seen  in a few MD offices was told that thyroid levels was low has to be seen ASAP    HPI Katelyn Soto presents to establish care Subjective:     Katelyn Soto is a 39 y.o. female who I was asked to see in consultation for evaluation of hyperthyroidism. Symptoms include changes in vision, headache, tachycardia, poor eating, weight loss, shortness of breath,  palpitations, hair loss.  Symptoms have been present for 2 months. The patient denies drug abuse, amphetamine use, diet pills, use of thyroid medicines, URI symptoms. The patient reports URI symptoms. She has COV-10 late June.  Prior studies include TSH completed at [facility].  Family history includes no thyroid abnormalities.   Past Medical History:  Diagnosis Date  . Anemia   . Chest tightness 11/14/2017  . Cigarette smoker 11/14/2017  . Iron deficiency anemia 01/14/2016  . Vitamin D deficiency 01/14/2016    Past Surgical History:  Procedure Laterality Date  . ESSURE TUBAL LIGATION      Family History  Problem Relation Age of Onset  . Hypertension Mother   . Diabetes Mother   . Heart disease Mother   . Heart failure Mother   . Cancer Father        throat    Social History   Socioeconomic History  . Marital status: Married    Spouse name: Not on file  . Number of children: Not on file  . Years of education: Not on file  . Highest education level: Not on file  Occupational History  . Not on file  Tobacco Use  . Smoking status: Never Smoker  . Smokeless tobacco: Never Used  Vaping Use  . Vaping Use: Never used  Substance and Sexual Activity  . Alcohol use: Yes      Comment: socially  . Drug use: No  . Sexual activity: Not on file  Other Topics Concern  . Not on file  Social History Narrative  . Not on file   Social Determinants of Health   Financial Resource Strain:   . Difficulty of Paying Living Expenses: Not on file  Food Insecurity:   . Worried About Programme researcher, broadcasting/film/video in the Last Year: Not on file  . Ran Out of Food in the Last Year: Not on file  Transportation Needs:   . Lack of Transportation (Medical): Not on file  . Lack of Transportation (Non-Medical): Not on file  Physical Activity:   . Days of Exercise per Week: Not on file  . Minutes of Exercise per Session: Not on file  Stress:   . Feeling of Stress : Not on file  Social Connections:   . Frequency of Communication with Friends and Family: Not on file  . Frequency of Social Gatherings with Friends and Family: Not on file  . Attends Religious Services: Not on file  . Active Member of Clubs or Organizations: Not on file  . Attends Banker Meetings: Not on file  . Marital Status: Not on file  Intimate Partner Violence:   .  Fear of Current or Ex-Partner: Not on file  . Emotionally Abused: Not on file  . Physically Abused: Not on file  . Sexually Abused: Not on file    Review of Systems Pertinent items are noted in HPI.   ROS Review of Systems  Constitutional: Positive for unexpected weight change.  Respiratory: Positive for chest tightness and shortness of breath.   Cardiovascular: Positive for palpitations.  Gastrointestinal: Positive for constipation.  Neurological: Positive for headaches.     Objective:    BP 137/75   Pulse (!) 120   Temp 98.6 F (37 C) (Temporal)   Resp (!) 22   Ht 5\' 4"  (1.626 m)   Wt 174 lb (78.9 kg)   SpO2 100%   BMI 29.87 kg/m   General:  alert, cooperative and appears stated age  Oropharynx: normal findings: tongue midline and normal   Eyes:  positive findings: eyelids/periorbital: slight protrusion   Ears:     Neck: no adenopathy, no carotid bruit, no JVD, supple, symmetrical, trachea midline and thyroid: enlarged  Thyroid:  no palpable nodule  Lung: clear to auscultation bilaterally  Heart:  S1, S2 normal tachycardia  Abdomen: soft, non-tender; bowel sounds normal; no masses,  no organomegaly  Extremities: extremities normal, atraumatic, no cyanosis or edema  Pulses: 2+ and symmetric  Skin: rash:  Neuro: normal without focal findings, mental status, speech normal, alert and oriented x3, PERLA and reflexes normal and symmetric   Lab Review Lab Results  Component Value Date   TSH <0.005 (L) 02/10/2020  A1c 5.9% Lab Results  Component Value Date   FREET4 >7.77 (H) 02/10/2020    pending addon   Assessment:    Hyperthyroidism. This diagnosis was discussed and reviewed with the patient including the advantages of drug therapy, radioactive iodine and surgery.    Plan:    1. Labs:  free thyroxine index, T3 uptake  Ultrasound of thyroid 2. Medications: continue beta-blocker encouraged patient to start today and then daily , start methimazole 3. The risks and benefits of my recommendations, as well as other treatment options were discussed with the patient today. Questions were answered. 4. Follow up: 1 month and as needed.  02/12/2020, NP

## 2020-02-12 LAB — T4, FREE: Free T4: >7.77 ng/dL — ABNORMAL HIGH (ref 0.82–1.77)

## 2020-02-12 LAB — T3, FREE: T3, Free: 22.8 pg/mL (ref 2.0–4.4)

## 2020-02-12 LAB — SPECIMEN STATUS REPORT

## 2020-02-14 ENCOUNTER — Telehealth (HOSPITAL_COMMUNITY): Payer: Self-pay | Admitting: *Deleted

## 2020-02-14 NOTE — Telephone Encounter (Signed)
Patient given detailed instructions per Myocardial Perfusion Study Information Sheet for the test on 02/16/20. Patient notified to arrive 15 minutes early and that it is imperative to arrive on time for appointment to keep from having the test rescheduled.  If you need to cancel or reschedule your appointment, please call the office within 24 hours of your appointment. . Patient verbalized understanding. Katelyn Soto    

## 2020-02-16 ENCOUNTER — Ambulatory Visit (HOSPITAL_COMMUNITY): Payer: Medicaid Other | Attending: Cardiovascular Disease

## 2020-02-16 ENCOUNTER — Other Ambulatory Visit: Payer: Self-pay

## 2020-02-16 DIAGNOSIS — R0789 Other chest pain: Secondary | ICD-10-CM | POA: Diagnosis not present

## 2020-02-16 DIAGNOSIS — R072 Precordial pain: Secondary | ICD-10-CM | POA: Diagnosis not present

## 2020-02-16 LAB — MYOCARDIAL PERFUSION IMAGING
LV dias vol: 72 mL (ref 46–106)
LV sys vol: 22 mL
Peak HR: 190 {beats}/min
Rest HR: 99 {beats}/min
SDS: 4
SRS: 2
SSS: 6
TID: 0.98

## 2020-02-16 MED ORDER — TECHNETIUM TC 99M TETROFOSMIN IV KIT
10.5000 | PACK | Freq: Once | INTRAVENOUS | Status: AC | PRN
  Administered 2020-02-16: 10.5 via INTRAVENOUS
  Filled 2020-02-16: qty 11

## 2020-02-16 MED ORDER — TECHNETIUM TC 99M TETROFOSMIN IV KIT
31.9000 | PACK | Freq: Once | INTRAVENOUS | Status: AC | PRN
  Administered 2020-02-16: 31.9 via INTRAVENOUS
  Filled 2020-02-16: qty 32

## 2020-02-16 MED ORDER — REGADENOSON 0.4 MG/5ML IV SOLN
0.4000 mg | Freq: Once | INTRAVENOUS | Status: AC
  Administered 2020-02-16: 0.4 mg via INTRAVENOUS

## 2020-02-17 LAB — POCT GLYCOSYLATED HEMOGLOBIN (HGB A1C): Hemoglobin A1C: 5.9 % — AB (ref 4.0–5.6)

## 2020-02-18 ENCOUNTER — Telehealth: Payer: Self-pay | Admitting: Cardiology

## 2020-02-18 ENCOUNTER — Other Ambulatory Visit: Payer: Self-pay | Admitting: Nurse Practitioner

## 2020-02-18 DIAGNOSIS — R002 Palpitations: Secondary | ICD-10-CM

## 2020-02-18 DIAGNOSIS — R634 Abnormal weight loss: Secondary | ICD-10-CM

## 2020-02-18 DIAGNOSIS — E0590 Thyrotoxicosis, unspecified without thyrotoxic crisis or storm: Secondary | ICD-10-CM

## 2020-02-18 DIAGNOSIS — R000 Tachycardia, unspecified: Secondary | ICD-10-CM

## 2020-02-18 NOTE — Telephone Encounter (Signed)
New message:     Patient calling to check to see if the doctor has revived patient results. From [facility]

## 2020-02-18 NOTE — Telephone Encounter (Signed)
Spoke with pt who was wanting to know if she needed medication since her heart rate was high during stress test. Pt states that her heart rate has not been that high since. No change needed in medication at this time. Pt verbalized understanding and had no additional questions.

## 2020-02-22 ENCOUNTER — Other Ambulatory Visit: Payer: Self-pay

## 2020-02-22 ENCOUNTER — Emergency Department (HOSPITAL_COMMUNITY): Payer: Medicaid Other

## 2020-02-22 ENCOUNTER — Telehealth: Payer: Self-pay | Admitting: Cardiology

## 2020-02-22 DIAGNOSIS — R079 Chest pain, unspecified: Secondary | ICD-10-CM | POA: Diagnosis not present

## 2020-02-22 DIAGNOSIS — R000 Tachycardia, unspecified: Secondary | ICD-10-CM | POA: Diagnosis not present

## 2020-02-22 DIAGNOSIS — Z8616 Personal history of COVID-19: Secondary | ICD-10-CM | POA: Insufficient documentation

## 2020-02-22 DIAGNOSIS — R42 Dizziness and giddiness: Secondary | ICD-10-CM | POA: Diagnosis not present

## 2020-02-22 LAB — CBC
HCT: 37 % (ref 36.0–46.0)
Hemoglobin: 11.8 g/dL — ABNORMAL LOW (ref 12.0–15.0)
MCH: 22.1 pg — ABNORMAL LOW (ref 26.0–34.0)
MCHC: 31.9 g/dL (ref 30.0–36.0)
MCV: 69.2 fL — ABNORMAL LOW (ref 80.0–100.0)
Platelets: 202 10*3/uL (ref 150–400)
RBC: 5.35 MIL/uL — ABNORMAL HIGH (ref 3.87–5.11)
RDW: 14.5 % (ref 11.5–15.5)
WBC: 8.7 10*3/uL (ref 4.0–10.5)
nRBC: 0 % (ref 0.0–0.2)

## 2020-02-22 LAB — BASIC METABOLIC PANEL
Anion gap: 8 (ref 5–15)
BUN: 9 mg/dL (ref 6–20)
CO2: 22 mmol/L (ref 22–32)
Calcium: 9.1 mg/dL (ref 8.9–10.3)
Chloride: 107 mmol/L (ref 98–111)
Creatinine, Ser: 0.46 mg/dL (ref 0.44–1.00)
GFR calc Af Amer: >60 mL/min (ref >60)
GFR calc non Af Amer: >60 mL/min (ref >60)
Glucose, Bld: 99 mg/dL (ref 70–99)
Potassium: 4 mmol/L (ref 3.5–5.1)
Sodium: 137 mmol/L (ref 135–145)

## 2020-02-22 LAB — URINALYSIS, ROUTINE W REFLEX MICROSCOPIC
Bilirubin Urine: NEGATIVE
Glucose, UA: NEGATIVE mg/dL
Hgb urine dipstick: NEGATIVE
Ketones, ur: NEGATIVE mg/dL
Leukocytes,Ua: NEGATIVE
Nitrite: NEGATIVE
Protein, ur: NEGATIVE mg/dL
Specific Gravity, Urine: 1.005 (ref 1.005–1.030)
pH: 6 (ref 5.0–8.0)

## 2020-02-22 LAB — I-STAT BETA HCG BLOOD, ED (NOT ORDERABLE): I-stat hCG, quantitative: <5 m[IU]/mL (ref <5)

## 2020-02-22 LAB — TROPONIN I (HIGH SENSITIVITY): Troponin I (High Sensitivity): 3 ng/L (ref <18)

## 2020-02-22 NOTE — Telephone Encounter (Signed)
Pt states that the medication causes her to feel tired when her heart rate is normal. How do you advise?

## 2020-02-22 NOTE — Telephone Encounter (Signed)
Patient wants to know if she should take medication today or what until she hears back?

## 2020-02-22 NOTE — Telephone Encounter (Signed)
Pt c/o medication issue:  1. Name of Medication: metoprolol succinate (TOPROL XL) 25 MG 24 hr tablet  2. How are you currently taking this medication (dosage and times per day)? 1 tablet daily  3. Are you having a reaction (difficulty breathing--STAT)? no  4. What is your medication issue? Patient states her HR has been normal, but the medication makes her sleepy. She would like to know if she still needs to take the medication, since her HR has been at a normal rhythm.

## 2020-02-23 ENCOUNTER — Encounter (HOSPITAL_COMMUNITY): Payer: Self-pay | Admitting: Emergency Medicine

## 2020-02-23 ENCOUNTER — Emergency Department (HOSPITAL_COMMUNITY)
Admission: EM | Admit: 2020-02-23 | Discharge: 2020-02-23 | Disposition: A | Discharge status: Home / Self Care | Payer: Medicaid Other | Attending: Emergency Medicine | Admitting: Emergency Medicine

## 2020-02-23 DIAGNOSIS — R079 Chest pain, unspecified: Secondary | ICD-10-CM

## 2020-02-23 LAB — TROPONIN I (HIGH SENSITIVITY): Troponin I (High Sensitivity): 4 ng/L (ref <18)

## 2020-02-23 LAB — D-DIMER, QUANTITATIVE: D-Dimer, Quant: 0.33 ug/mL-FEU (ref 0.00–0.50)

## 2020-02-23 NOTE — ED Triage Notes (Signed)
Patient states mid chest pain that started around 1600 and progressively got worse. Patient states that she thought at first it was a panic attack but her anxiety medication did not work. Patient is currently not having any pain.

## 2020-02-23 NOTE — ED Provider Notes (Signed)
[facility]-EMERGENCY DEPT Provider Note   CSN: [id number] Arrival date & time: 02/22/20  2121     History Chief Complaint  Patient presents with  . Chest Pain    Katelyn Soto is a 39 y.o. female.  Patient presents to the emergency department with a chief complaint of chest pain.  She states that the pain started around 4 PM yesterday afternoon and has gradually worsened.  She states that the pain caused her to feel very anxious, and she believes caused her to have a panic attack.  She reports having had intermittent spells of racing heart beat, pleuritic chest pain, and anxiety since having covid.  She has been taking propranolol, but skipped today after feeling dizzy.  The history is provided by the patient. No language interpreter was used.       Past Medical History:  Diagnosis Date  . Anemia   . Chest tightness 11/14/2017  . Cigarette smoker 11/14/2017  . Iron deficiency anemia 01/14/2016  . Vitamin D deficiency 01/14/2016    Patient Active Problem List   Diagnosis Date Noted  . Tachycardia 02/10/2020  . History of COVID-19 02/10/2020  . Shortness of breath 02/10/2020  . Cardiac murmur 02/10/2020  . Palpitations 02/10/2020  . Anemia   . Chest tightness 11/14/2017  . Cigarette smoker 11/14/2017  . Iron deficiency anemia 01/14/2016  . Vitamin D deficiency 01/14/2016    Past Surgical History:  Procedure Laterality Date  . ESSURE TUBAL LIGATION       OB History   No obstetric history on file.     Family History  Problem Relation Age of Onset  . Hypertension Mother   . Diabetes Mother   . Heart disease Mother   . Heart failure Mother   . Cancer Father        throat    Social History   Tobacco Use  . Smoking status: Never Smoker  . Smokeless tobacco: Never Used  Vaping Use  . Vaping Use: Never used  Substance Use Topics  . Alcohol use: Yes    Comment: socially  . Drug use: No    Home Medications Prior to Admission  medications   Medication Sig Start Date End Date Taking? Authorizing Provider  hydrOXYzine (VISTARIL) 25 MG capsule Take 1 capsule (25 mg total) by mouth 3 (three) times daily as needed. 02/09/20   Ivonne Andrew, NP  ibuprofen (ADVIL) 600 MG tablet Take 1 tablet (600 mg total) by mouth every 6 (six) hours as needed. Patient not taking: Reported on 02/11/2020 06/21/19   Merrilee Jansky, MD  methimazole (TAPAZOLE) 5 MG tablet Take 1 tablet (5 mg total) by mouth 3 (three) times daily. 02/11/20 05/11/20  Barbette Merino, NP  metoprolol succinate (TOPROL XL) 25 MG 24 hr tablet Take 1 tablet (25 mg total) by mouth daily. 02/10/20   Revankar, Aundra Dubin, MD  sertraline (ZOLOFT) 100 MG tablet Take 100 mg by mouth daily. 10/15/17 06/21/19  [provider]    Allergies    Patient has no known allergies.  Review of Systems   Review of Systems  All other systems reviewed and are negative.   Physical Exam Updated Vital Signs BP (!) 120/92 (BP Location: Left Arm)   Pulse 92   Temp 98.5 F (36.9 C) (Oral)   Resp 18   Ht 5\' 4"  (1.626 m)   Wt 78.5 kg   SpO2 100%   BMI 29.70 kg/m   Physical Exam  Vitals and nursing note reviewed.  Constitutional:      General: She is not in acute distress.    Appearance: She is well-developed.  HENT:     Head: Normocephalic and atraumatic.  Eyes:     Conjunctiva/sclera: Conjunctivae normal.  Cardiovascular:     Rate and Rhythm: Regular rhythm. Tachycardia present.     Heart sounds: No murmur heard.      Comments: Mildly tachy Pulmonary:     Effort: Pulmonary effort is normal. No respiratory distress.     Breath sounds: Normal breath sounds.  Abdominal:     General: There is no distension.     Palpations: Abdomen is soft.     Tenderness: There is no abdominal tenderness.  Musculoskeletal:     Cervical back: Neck supple.     Comments: Moves all extremities  Skin:    General: Skin is warm and dry.  Neurological:     Mental Status: She is alert  and oriented to person, place, and time.  Psychiatric:        Mood and Affect: Mood normal.        Behavior: Behavior normal.     ED Results / Procedures / Treatments   Labs (all labs ordered are listed, but only abnormal results are displayed) Labs Reviewed  CBC - Abnormal; Notable for the following components:      Result Value   RBC 5.35 (*)    Hemoglobin 11.8 (*)    MCV 69.2 (*)    MCH 22.1 (*)    All other components within normal limits  URINALYSIS, ROUTINE W REFLEX MICROSCOPIC - Abnormal; Notable for the following components:   Color, Urine STRAW (*)    All other components within normal limits  BASIC METABOLIC PANEL  D-DIMER, QUANTITATIVE (NOT AT [facility])  I-STAT BETA HCG BLOOD, ED ([facility], [facility], [facility] ONLY)  I-STAT BETA HCG BLOOD, ED (NOT ORDERABLE)  TROPONIN I (HIGH SENSITIVITY)  TROPONIN I (HIGH SENSITIVITY)    EKG None  Radiology DG Chest 2 View  Result Date: 02/22/2020 CLINICAL DATA:  Chest pain and irregular heart beat. EXAM: CHEST - 2 VIEW COMPARISON:  February 09, 2020 FINDINGS: The heart size and mediastinal contours are within normal limits. Both lungs are clear. The visualized skeletal structures are unremarkable. IMPRESSION: No active cardiopulmonary disease. Electronically Signed   By: Aram Candela M.D.   On: 02/22/2020 22:11    Procedures Procedures (including critical care time)  Medications Ordered in ED Medications - No data to display  ED Course  I have reviewed the triage vital signs and the nursing notes.  Pertinent labs & imaging results that were available during my care of the patient were reviewed by me and considered in my medical decision making (see chart for details).    MDM Rules/Calculators/A&P                          This patient complains of chest pain, rapid HR and anxiety, this involves an extensive number of treatment options, and is a complaint that carries with it a high risk of complications and morbidity.    Differential  Dx PE, ACS, anxiety, post-viral pleurisy  Pertinent Labs I ordered, reviewed, and interpreted labs, which included CBC, BMP, trops, d-dimer which are all reassuring.  Imaging Interpretation I ordered imaging studies which included CXR.  I independently visualized and interpreted the CXR, which showed no obvious abnormality.   Reassessments After the interventions stated  above, I reevaluated the patient and found reassured and stable.  Consultants none  Plan DC with outpatient follow-up.  Return precautions discussed.  No clear etiology of symptoms, but doesn't appear to need additional emergent workup tonight.    Final Clinical Impression(s) / ED Diagnoses Final diagnoses:  Nonspecific chest pain    Rx / DC Orders ED Discharge Orders    None       Roxy Horseman, PA-C 02/23/20 0404    Dione Booze, MD 02/23/20 [phone]

## 2020-02-23 NOTE — Telephone Encounter (Signed)
She can hold it till she sees me next

## 2020-02-23 NOTE — Telephone Encounter (Signed)
Left a VM for for to callback.

## 2020-02-23 NOTE — Telephone Encounter (Signed)
Can you advise me on what to tell the pt? She has also started medications for her thyroid too.

## 2020-02-24 ENCOUNTER — Ambulatory Visit: Payer: Self-pay | Admitting: Nurse Practitioner

## 2020-02-24 ENCOUNTER — Ambulatory Visit (HOSPITAL_COMMUNITY)
Admission: RE | Admit: 2020-02-24 | Discharge: 2020-02-24 | Disposition: A | Discharge status: Home / Self Care | Payer: Medicaid Other | Source: Ambulatory Visit | Attending: Nurse Practitioner | Admitting: Nurse Practitioner

## 2020-02-24 ENCOUNTER — Other Ambulatory Visit: Payer: Self-pay

## 2020-02-24 DIAGNOSIS — R634 Abnormal weight loss: Secondary | ICD-10-CM

## 2020-02-24 DIAGNOSIS — R000 Tachycardia, unspecified: Secondary | ICD-10-CM | POA: Insufficient documentation

## 2020-02-24 DIAGNOSIS — E0590 Thyrotoxicosis, unspecified without thyrotoxic crisis or storm: Secondary | ICD-10-CM | POA: Insufficient documentation

## 2020-02-24 DIAGNOSIS — R002 Palpitations: Secondary | ICD-10-CM | POA: Diagnosis not present

## 2020-03-02 ENCOUNTER — Ambulatory Visit (INDEPENDENT_AMBULATORY_CARE_PROVIDER_SITE_OTHER): Payer: Medicaid Other | Admitting: Nurse Practitioner

## 2020-03-02 DIAGNOSIS — Z8616 Personal history of COVID-19: Secondary | ICD-10-CM

## 2020-03-02 NOTE — Progress Notes (Signed)
@Patient  ID: , female    DOB: 09/07/1980, 39 y.o.   MRN: [id number]  Chief Complaint  Patient presents with  . Follow-up    post covid    Referring provider: [id number], MD   39 year old female with history of anemia, vitamin D deficiency. Diagnosed with Covid June 2021.   HPI  Patient presents today for post COVID care clinic visit follow-up.  She was diagnosed with Covid in June 2021.  Since her last visit here she has seen cardiology.  She has had a stress test, Zio patch, and is scheduled for an echo soon.  Cardiology started patient on Toprol.  They also checked labs and thyroid was abnormal.  Patient has been referred to endocrinology and placed on Tapazole.  Patient's heart rate was still elevated in the office today.  She states that she has not been recently taking her Toprol.  She is trying to stay active.  She states that her loss of taste and smell is improving.  She states that her skin issues are improving.  She is planning on starting back a walking routine.  Patient is still struggling with anxiety and did have an ED visit for what appeared to be a panic attack.  Patient's OB/GYN has prescribed Wellbutrin.  Patient has not started taking the medication yet.  Patient is also taking Vistaril as needed.  Denies f/c/s, n/v/d, hemoptysis, PND, chest pain or edema.      No Known Allergies   There is no immunization history on file for this patient.  Past Medical History:  Diagnosis Date  . Anemia   . Chest tightness 11/14/2017  . Cigarette smoker 11/14/2017  . Iron deficiency anemia 01/14/2016  . Vitamin D deficiency 01/14/2016    Tobacco History: Social History   Tobacco Use  Smoking Status Never Smoker  Smokeless Tobacco Never Used   Counseling given: Not Answered   Outpatient Encounter Medications as of 03/02/2020  Medication Sig  . hydrOXYzine (VISTARIL) 25 MG capsule Take 1 capsule (25 mg total) by mouth 3 (three) times daily as needed.  05/02/2020  ibuprofen (ADVIL) 600 MG tablet Take 1 tablet (600 mg total) by mouth every 6 (six) hours as needed. (Patient not taking: Reported on 02/11/2020)  . methimazole (TAPAZOLE) 5 MG tablet Take 1 tablet (5 mg total) by mouth 3 (three) times daily.  . metoprolol succinate (TOPROL XL) 25 MG 24 hr tablet Take 1 tablet (25 mg total) by mouth daily.  . [DISCONTINUED] sertraline (ZOLOFT) 100 MG tablet Take 100 mg by mouth daily.   No facility-administered encounter medications on file as of 03/02/2020.     Review of Systems  Review of Systems  Constitutional: Negative.   HENT: Negative.   Respiratory: Negative for cough and shortness of breath.   Cardiovascular: Positive for palpitations. Negative for chest pain and leg swelling.  Gastrointestinal: Negative.   Allergic/Immunologic: Negative.   Neurological: Negative.   Psychiatric/Behavioral: Negative.        Physical Exam  There were no vitals taken for this visit.  Wt Readings from Last 5 Encounters:  02/22/20 173 lb (78.5 kg)  02/16/20 174 lb (78.9 kg)  02/11/20 174 lb (78.9 kg)  02/10/20 174 lb 12.8 oz (79.3 kg)  02/09/20 173 lb 0.1 oz (78.5 kg)     Physical Exam Vitals and nursing note reviewed.  Constitutional:      General: She is not in acute distress.    Appearance: She is well-developed.  Cardiovascular:     Rate and Rhythm: Normal rate and regular rhythm.     Heart sounds: Murmur heard.   Pulmonary:     Effort: Pulmonary effort is normal.     Breath sounds: Normal breath sounds.  Musculoskeletal:     Right lower leg: No edema.     Left lower leg: No edema.  Neurological:     Mental Status: She is alert and oriented to person, place, and time.  Psychiatric:        Mood and Affect: Mood normal.        Behavior: Behavior normal.       Imaging: DG Chest 2 View  Result Date: 02/22/2020 CLINICAL DATA:  Chest pain and irregular heart beat. EXAM: CHEST - 2 VIEW COMPARISON:  February 09, 2020 FINDINGS: The heart  size and mediastinal contours are within normal limits. Both lungs are clear. The visualized skeletal structures are unremarkable. IMPRESSION: No active cardiopulmonary disease. Electronically Signed   By: Aram Candela M.D.   On: 02/22/2020 22:11   DG Chest 2 View  Result Date: 02/10/2020 CLINICAL DATA:  Persistent shortness of breath, COVID [id number] EXAM: CHEST - 2 VIEW COMPARISON:  05/22/2015 FINDINGS: The heart size and mediastinal contours are within normal limits. Both lungs are clear. The visualized skeletal structures are unremarkable. IMPRESSION: No active cardiopulmonary disease. Electronically Signed   By: Sharlet Salina M.D.   On: 02/10/2020 00:27   MYOCARDIAL PERFUSION IMAGING  Result Date: 02/16/2020  The left ventricular ejection fraction is hyperdynamic (>65%).  Nuclear stress EF: 69%.  There was no ST segment deviation noted during stress.  The study is normal.  This is a low risk study.  Low risk stress nuclear study with normal perfusion and normal left ventricular regional and global systolic function.  US THYROID  Result Date: 02/24/2020 CLINICAL DATA:  Hyperthyroidism EXAM: THYROID ULTRASOUND TECHNIQUE: Ultrasound examination of the thyroid gland and adjacent soft tissues was performed. COMPARISON:  None. FINDINGS: Parenchymal Echotexture: Moderately heterogenous Isthmus: 8 mm Right lobe: 5.7 x 2.2 x 2.4 cm Left lobe: 5.6 x 2.3 x 2.3 cm _________________________________________________________ Estimated total number of nodules >/= 1 cm: 0 Number of spongiform nodules >/=  2 cm not described below (TR1): 0 Number of mixed cystic and solid nodules >/= 1.5 cm not described below (TR2): 0 _________________________________________________________ Moderate thyroid heterogeneity without discrete nodule or focal abnormality. No hypervascularity. No regional adenopathy. IMPRESSION: Nonspecific thyroid heterogeneity compatible with medical thyroid disease. No other significant finding  or nodule by ultrasound. The above is in keeping with the ACR TI-RADS recommendations - J Am Coll Radiol 2017;14:587-595. Electronically Signed   By: Judie Petit.  Shick M.D.   On: 02/24/2020 09:30     Assessment & Plan:   History of COVID-19 Tachycardia:  Please keep upcoming follow up visit with cardiology  Stay active  Stay well hydrated  Continue vitamin C, zinc, vitamin D3  Please avoid caffeine   Abnormal Thyroid testing:  Please keep upcoming appointment with Endocrinology   May start Gluten free diet   Headache:  May take tylenol as needed  May start magnesium  Loss of taste and Smell:  Improving  May try on-line olfactory re-training using essential oils  Anxiety:  Continue vistaril to take as needed  May start Wellbutrin as prescribed by OB/GYN  Rash:  May use benadryl cream  May start zyrtec daily  Follow up:  Follow up in 3 months or sooner if needed  Ivonne Andrew, NP 03/02/2020

## 2020-03-02 NOTE — Assessment & Plan Note (Signed)
Tachycardia:  Please keep upcoming follow up visit with cardiology  Stay active  Stay well hydrated  Continue vitamin C, zinc, vitamin D3  Please avoid caffeine   Abnormal Thyroid testing:  Please keep upcoming appointment with Endocrinology   May start Gluten free diet   Headache:  May take tylenol as needed  May start magnesium  Loss of taste and Smell:  Improving  May try on-line olfactory re-training using essential oils  Anxiety:  Continue vistaril to take as needed  May start Wellbutrin as prescribed by OB/GYN  Rash:  May use benadryl cream  May start zyrtec daily  Follow up:  Follow up in 3 months or sooner if needed

## 2020-03-02 NOTE — Patient Instructions (Addendum)
History of COVID-19 Tachycardia:  Please keep upcoming follow up visit with cardiology  Stay active  Stay well hydrated  Continue vitamin C, zinc, vitamin D3  Please avoid caffeine   Abnormal Thyroid testing:  Please keep upcoming appointment with Endocrinology    Headache:  May take tylenol as needed  May start magnesium  Loss of taste and Smell:  Improving  May try on-line olfactory re-training using essential oils  Anxiety:  Continue vistaril to take as needed  May start Wellbutrin as prescribed by OB/GYN  Rash:  May use benadryl cream  May start zyrtec daily  Follow up:  Follow up in 3 months or sooner if needed        Gluten-Free Diet, Adult  The gluten-free diet includes all foods that do not contain gluten. Gluten is a protein that is found in wheat, rye, barley, and some other grains.  Following the gluten-free diet requires some planning. It can be challenging at first, but it gets easier with time and practice. There are more gluten-free options available today than ever before. If you need help finding gluten-free foods or if you have questions, talk with your diet and nutrition specialist (registered dietitian) or your health care provider. What do I need to know about a gluten-free diet?  All fruits, vegetables, and meats are safe to eat and do not contain gluten.  When grocery shopping, start by shopping in the produce, meat, and dairy sections. These sections are more likely to contain gluten-free foods. Then move to the aisles that contain packaged foods if you need to.  Read all food labels. Gluten is often added to foods. Always check the ingredient list and look for warnings, such as "may contain gluten."  Talk with your dietitian or health care provider before taking a gluten-free multivitamin or mineral supplement.  Be aware of gluten-free foods having contact with foods that contain gluten  (cross-contamination). This can happen at home and with any processed foods. ? Talk with your health care provider or dietitian about how to reduce the risk of cross-contamination in your home. ? If you have questions about how a food is processed, ask the manufacturer. What key words help to identify gluten? Foods that list any of these key words on the label usually contain gluten:  Wheat, flour, enriched flour, bromated flour, white flour, durum flour, graham flour, phosphated flour, self-rising flour, semolina, farina, barley (malt), rye, and oats.  Starch, dextrin, modified food starch, or cereal.  Thickening, fillers, or emulsifiers.  Malt flavoring, malt extract, or malt syrup.  Hydrolyzed vegetable protein. In the [place] packaged foods that are gluten-free are required to be labeled "GF." These foods should be easy to identify and are safe to eat. In the [place] food companies are also required to list common food allergens, including wheat, on their labels. Recommended foods Grains  Amaranth, bean flours, 100% buckwheat flour, corn, millet, nut flours or nut meals, GF oats, quinoa, rice, sorghum, teff, rice wafers, pure cornmeal tortillas, popcorn, and hot cereals made from cornmeal. Hominy, rice, wild rice. Some Asian rice noodles or bean noodles. Arrowroot starch, corn bran, corn flour, corn germ, cornmeal, corn starch, potato flour, potato starch flour, and rice bran. Plain, brown, and sweet rice flours. Rice polish, soy flour, and tapioca starch. Vegetables  All plain fresh, frozen, and canned vegetables. Fruits  All plain fresh, frozen, canned, and dried fruits, and 100% fruit juices. Meats and other protein foods  All fresh beef, pork, poultry, fish,  seafood, and eggs. Fish canned in water, oil, brine, or vegetable broth. Plain nuts and seeds, peanut butter. Some lunch meat and some frankfurters. Dried beans, dried peas, and lentils. Dairy  Fresh plain, dry, evaporated, or  condensed milk. Cream, butter, sour cream, whipping cream, and most yogurts. Unprocessed cheese, most processed cheeses, some cottage cheese, some cream cheeses. Beverages  Coffee, tea, most herbal teas. Carbonated beverages and some root beers. Wine, sake, and distilled spirits, such as gin, vodka, and whiskey. Most hard ciders. Fats and oils  Butter, margarine, vegetable oil, hydrogenated butter, olive oil, shortening, lard, cream, and some mayonnaise. Some commercial salad dressings. Olives. Sweets and desserts  Sugar, honey, some syrups, molasses, jelly, and jam. Plain hard candy, marshmallows, and gumdrops. Pure cocoa powder. Plain chocolate. Custard and some pudding mixes. Gelatin desserts, sorbets, frozen ice pops, and sherbet. Cake, cookies, and other desserts prepared with allowed flours. Some commercial ice creams. Cornstarch, tapioca, and rice puddings. Seasoning and other foods  Some canned or frozen soups. Monosodium glutamate (MSG). Cider, rice, and wine vinegar. Baking soda and baking powder. Cream of tartar. Baking and nutritional yeast. Certain soy sauces made without wheat (ask your dietitian about specific brands that are allowed). Nuts, coconut, and chocolate. Salt, pepper, herbs, spices, flavoring extracts, imitation or artificial flavorings, natural flavorings, and food colorings. Some medicines and supplements. Some lip glosses and other cosmetics. Rice syrups. The items listed may not be a complete list. Talk with your dietitian about what dietary choices are best for you. Foods to avoid Grains  Barley, bran, bulgur, couscous, cracked wheat, [place], farro, graham, malt, matzo, semolina, wheat germ, and all wheat and rye cereals including spelt and kamut. Cereals containing malt as a flavoring, such as rice cereal. Noodles, spaghetti, macaroni, most packaged rice mixes, and all mixes containing wheat, rye, barley, or triticale. Vegetables  Most creamed vegetables and most  vegetables canned in sauces. Some commercially prepared vegetables and salads. Fruits  Thickened or prepared fruits and some pie fillings. Some fruit snacks and fruit roll-ups. Meats and other protein foods  Any meat or meat alternative containing wheat, rye, barley, or gluten stabilizers. These are often marinated or packaged meats and lunch meats. Bread-containing products, such as Swiss steak, croquettes, meatballs, and meatloaf. Most tuna canned in vegetable broth and [place] with hydrolyzed vegetable protein (HVP) injected as part of the basting. Seitan. Imitation fish. Eggs in sauces made from ingredients to avoid. Dairy  Commercial chocolate milk drinks and malted milk. Some non-dairy creamers. Any cheese product containing ingredients to avoid. Beverages  Certain cereal beverages. Beer, ale, malted milk, and some root beers. Some hard ciders. Some instant flavored coffees. Some herbal teas made with barley or with barley malt added. Fats and oils  Some commercial salad dressings. Sour cream containing modified food starch. Sweets and desserts  Some toffees. Chocolate-coated nuts (may be rolled in wheat flour) and some commercial candies and candy bars. Most cakes, cookies, donuts, pastries, and other baked goods. Some commercial ice cream. Ice cream cones. Commercially prepared mixes for cakes, cookies, and other desserts. Bread pudding and other puddings thickened with flour. Products containing brown rice syrup made with barley malt enzyme. Desserts and sweets made with malt flavoring. Seasoning and other foods  Some curry powders, some dry seasoning mixes, some gravy extracts, some meat sauces, some ketchups, some prepared mustards, and horseradish. Certain soy sauces. Malt vinegar. Bouillon and bouillon cubes that contain HVP. Some chip dips, and some chewing gum. Yeast extract. Brewer's  yeast. Caramel color. Some medicines and supplements. Some lip glosses and other cosmetics. The  items listed may not be a complete list. Talk with your dietitian about what dietary choices are best for you. Summary  Gluten is a protein that is found in wheat, rye, barley, and some other grains. The gluten-free diet includes all foods that do not contain gluten.  If you need help finding gluten-free foods or if you have questions, talk with your diet and nutrition specialist (registered dietitian) or your health care provider.  Read all food labels. Gluten is often added to foods. Always check the ingredient list and look for warnings, such as "may contain gluten." This information is not intended to replace advice given to you by your health care provider. Make sure you discuss any questions you have with your health care provider. Document Revised: 04/25/2017 Document Reviewed: 02/26/2016 Elsevier Patient Education  2020 [employer].

## 2020-03-03 ENCOUNTER — Other Ambulatory Visit: Payer: Self-pay

## 2020-03-03 ENCOUNTER — Ambulatory Visit (HOSPITAL_BASED_OUTPATIENT_CLINIC_OR_DEPARTMENT_OTHER)
Admission: RE | Admit: 2020-03-03 | Discharge: 2020-03-03 | Disposition: A | Discharge status: Home / Self Care | Payer: Medicaid Other | Source: Ambulatory Visit | Attending: Cardiology | Admitting: Cardiology

## 2020-03-03 DIAGNOSIS — R011 Cardiac murmur, unspecified: Secondary | ICD-10-CM | POA: Diagnosis not present

## 2020-03-03 DIAGNOSIS — R002 Palpitations: Secondary | ICD-10-CM | POA: Insufficient documentation

## 2020-03-03 LAB — ECHOCARDIOGRAM COMPLETE
Area-P 1/2: 4.63 cm2
S' Lateral: 3.23 cm

## 2020-03-08 NOTE — Telephone Encounter (Signed)
How do you advise? Katelyn Soto

## 2020-03-09 NOTE — Telephone Encounter (Signed)
It is best to take the medicine on a regular basis especially with the issues she is experiencing

## 2020-03-10 ENCOUNTER — Ambulatory Visit (INDEPENDENT_AMBULATORY_CARE_PROVIDER_SITE_OTHER): Payer: Medicaid Other | Admitting: Nurse Practitioner

## 2020-03-10 ENCOUNTER — Encounter: Payer: Self-pay | Admitting: Nurse Practitioner

## 2020-03-10 ENCOUNTER — Other Ambulatory Visit: Payer: Self-pay

## 2020-03-10 VITALS — BP 129/71 | HR 111 | Temp 98.4°F | Ht 64.0 in | Wt 175.0 lb

## 2020-03-10 DIAGNOSIS — E0590 Thyrotoxicosis, unspecified without thyrotoxic crisis or storm: Secondary | ICD-10-CM

## 2020-03-10 HISTORY — DX: Thyrotoxicosis, unspecified without thyrotoxic crisis or storm: E05.90

## 2020-03-10 MED ORDER — METHIMAZOLE 10 MG PO TABS: 10.0000 mg | ORAL_TABLET | Freq: Three times a day (TID) | ORAL | 2 refills | Status: DC

## 2020-03-10 NOTE — Patient Instructions (Signed)

## 2020-03-10 NOTE — Progress Notes (Signed)
Established Patient Office Visit  Subjective:  Patient ID: Katelyn Soto, female    DOB: Aug 09, 1980  Age: 39 y.o. MRN: [id number]  CC:  Chief Complaint  Patient presents with  . Follow-up    wants to discuss taking the Metoprolol    HPI Katelyn Soto presents for follow up. She  has a past medical history of Anemia, Chest tightness (11/14/2017), Cigarette smoker (11/14/2017), Iron deficiency anemia (01/14/2016), and Vitamin D deficiency (01/14/2016).   Hyperthyroidism Patient presents with hyperthyroidism. Symptoms include tachycardia, palpitations.  She continues to have some shortness of breath.   Symptoms have been present for 2 months. The patient denies drug abuse, amphetamine use, diet pills, episodic hypertension, use of thyroid medicines, URI symptoms, tender neck / sore throat, pheochromocytoma. The patient reports none. Prior studies include TSH (<0.005), thyroid uptake, thyroid scan (Negative).  Family history includes no thyroid abnormalities. Free T4 >7.77 & T3 22.8.  She continues to have tachycardia. She has been on metoprolol. However when her heart rate when back to normal of 65 b/m. She requested to discontinue the metoprolol. She feels like it was making her sleepy. She is going to restart the Metoprolol today. Her cardiac work up was negative.   Past Medical History:  Diagnosis Date  . Anemia   . Chest tightness 11/14/2017  . Cigarette smoker 11/14/2017  . Iron deficiency anemia 01/14/2016  . Vitamin D deficiency 01/14/2016    Past Surgical History:  Procedure Laterality Date  . ESSURE TUBAL LIGATION      Family History  Problem Relation Age of Onset  . Hypertension Mother   . Diabetes Mother   . Heart disease Mother   . Heart failure Mother   . Cancer Father        throat    Social History   Socioeconomic History  . Marital status: Legally Separated    Spouse name: Not on file  . Number of children: Not on file  . Years of education: Not on file  .  Highest education level: Not on file  Occupational History  . Not on file  Tobacco Use  . Smoking status: Never Smoker  . Smokeless tobacco: Never Used  Vaping Use  . Vaping Use: Never used  Substance and Sexual Activity  . Alcohol use: Yes    Comment: socially  . Drug use: No  . Sexual activity: Not on file  Other Topics Concern  . Not on file  Social History Narrative  . Not on file   Social Determinants of Health   Financial Resource Strain:   . Difficulty of Paying Living Expenses: Not on file  Food Insecurity:   . Worried About Programme researcher, broadcasting/film/video in the Last Year: Not on file  . Ran Out of Food in the Last Year: Not on file  Transportation Needs:   . Lack of Transportation (Medical): Not on file  . Lack of Transportation (Non-Medical): Not on file  Physical Activity:   . Days of Exercise per Week: Not on file  . Minutes of Exercise per Session: Not on file  Stress:   . Feeling of Stress : Not on file  Social Connections:   . Frequency of Communication with Friends and Family: Not on file  . Frequency of Social Gatherings with Friends and Family: Not on file  . Attends Religious Services: Not on file  . Active Member of Clubs or Organizations: Not on file  . Attends Banker Meetings: Not  on file  . Marital Status: Not on file  Intimate Partner Violence:   . Fear of Current or Ex-Partner: Not on file  . Emotionally Abused: Not on file  . Physically Abused: Not on file  . Sexually Abused: Not on file    Outpatient Medications Prior to Visit  Medication Sig Dispense Refill  . hydrOXYzine (VISTARIL) 25 MG capsule Take 1 capsule (25 mg total) by mouth 3 (three) times daily as needed. 30 capsule 0  . ibuprofen (ADVIL) 600 MG tablet Take 1 tablet (600 mg total) by mouth every 6 (six) hours as needed. 30 tablet 0  . metoprolol succinate (TOPROL XL) 25 MG 24 hr tablet Take 1 tablet (25 mg total) by mouth daily. 30 tablet 6  . methimazole (TAPAZOLE) 5 MG  tablet Take 1 tablet (5 mg total) by mouth 3 (three) times daily. 90 tablet 2   No facility-administered medications prior to visit.    No Known Allergies  ROS Review of Systems    Objective:    Physical Exam HENT:     Head: Normocephalic and atraumatic.     Nose: Nose normal.     Mouth/Throat:     Mouth: Mucous membranes are moist.  Cardiovascular:     Rate and Rhythm: Tachycardia present.     Pulses: Normal pulses.     Heart sounds: Normal heart sounds.  Pulmonary:     Effort: Pulmonary effort is normal.     Breath sounds: Normal breath sounds.  Abdominal:     Palpations: Abdomen is soft.  Musculoskeletal:        General: Normal range of motion.     Cervical back: Normal range of motion.  Skin:    General: Skin is warm and dry.     Capillary Refill: Capillary refill takes less than 2 seconds.  Neurological:     General: No focal deficit present.     Mental Status: She is alert and oriented to person, place, and time.  Psychiatric:        Mood and Affect: Mood normal.        Behavior: Behavior normal.        Thought Content: Thought content normal.        Judgment: Judgment normal.     Comments: She is dealing with the new diagnosis and having to take daily prescription medication. She is feeling like a mom again.       BP 129/71 (BP Location: Right Arm, Patient Position: Sitting, Cuff Size: Normal)   Pulse (!) 111   Temp 98.4 F (36.9 C) (Temporal)   Ht 5\' 4"  (1.626 m)   Wt 175 lb (79.4 kg)   SpO2 97%   BMI 30.04 kg/m  Wt Readings from Last 3 Encounters:  03/10/20 175 lb (79.4 kg)  02/22/20 173 lb (78.5 kg)  02/16/20 174 lb (78.9 kg)     Health Maintenance Due  Topic Date Due  . COVID-19 Vaccine (1) Never done    There are no preventive care reminders to display for this patient.  Lab Results  Component Value Date   TSH <0.005 (L) 02/10/2020   Lab Results  Component Value Date   WBC 8.7 02/22/2020   HGB 11.8 (L) 02/22/2020   HCT 37.0  02/22/2020   MCV 69.2 (L) 02/22/2020   PLT 202 02/22/2020   Lab Results  Component Value Date   NA 137 02/22/2020   K 4.0 02/22/2020   CO2 22 02/22/2020   GLUCOSE  99 02/22/2020   BUN 9 02/22/2020   CREATININE 0.46 02/22/2020   BILITOT 0.3 02/09/2020   ALKPHOS 70 02/09/2020   AST 23 02/09/2020   ALT 42 (H) 02/09/2020   PROT 6.0 02/09/2020   ALBUMIN 3.5 (L) 02/09/2020   CALCIUM 9.1 02/22/2020   ANIONGAP 8 02/22/2020   Lab Results  Component Value Date   CHOL 166 11/14/2017   Lab Results  Component Value Date   HDL 40 11/14/2017   Lab Results  Component Value Date   LDLCALC 115 (H) 11/14/2017   Lab Results  Component Value Date   TRIG 55 11/14/2017   Lab Results  Component Value Date   CHOLHDL 4.2 11/14/2017   Lab Results  Component Value Date   HGBA1C 5.9 (A) 02/17/2020      Assessment & Plan:   Problem List Items Addressed This Visit      Endocrine   Hyperthyroidism - Primary   Relevant Medications   methimazole (TAPAZOLE) 10 MG tablet   Other Relevant Orders   Ambulatory referral to Endocrinology      Meds ordered this encounter  Medications  . methimazole (TAPAZOLE) 10 MG tablet    Sig: Take 1 tablet (10 mg total) by mouth 3 (three) times daily.    Dispense:  90 tablet    Refill:  2    Order Specific Question:   Supervising Provider    Answer:   Quentin Angst [id number]    Follow-up: Return in about 4 weeks (around 04/07/2020) for televisit.    Barbette Merino, NP

## 2020-03-13 ENCOUNTER — Telehealth: Payer: Self-pay | Admitting: Nurse Practitioner

## 2020-03-14 ENCOUNTER — Other Ambulatory Visit: Payer: Self-pay

## 2020-03-14 MED ORDER — METHIMAZOLE 10 MG PO TABS: 10.0000 mg | ORAL_TABLET | Freq: Three times a day (TID) | ORAL | 2 refills | Status: DC

## 2020-03-14 NOTE — Telephone Encounter (Signed)
Done

## 2020-03-15 ENCOUNTER — Other Ambulatory Visit: Payer: Self-pay | Admitting: Nurse Practitioner

## 2020-03-21 ENCOUNTER — Ambulatory Visit: Payer: Medicaid Other | Admitting: Internal Medicine

## 2020-03-21 ENCOUNTER — Encounter: Payer: Self-pay | Admitting: Internal Medicine

## 2020-03-21 ENCOUNTER — Other Ambulatory Visit: Payer: Self-pay

## 2020-03-21 VITALS — BP 124/82 | HR 79 | Ht 64.0 in | Wt 174.4 lb

## 2020-03-21 DIAGNOSIS — E0590 Thyrotoxicosis, unspecified without thyrotoxic crisis or storm: Secondary | ICD-10-CM | POA: Diagnosis not present

## 2020-03-21 NOTE — Progress Notes (Signed)
Name: Katelyn Soto  MRN/ DOB: [id number], 1980-11-17    Age/ Sex: 39 y.o., female    PCP: Barbette Merino, NP   Reason for Endocrinology Evaluation: Hyperthyroidism     Date of Initial Endocrinology Evaluation: 03/21/2020     HPI: Katelyn Soto is a 39 y.o. female with a past medical history of anemia. The patient presented for initial endocrinology clinic visit on 03/21/2020 for consultative assistance with her Hyperthyroidism.   She has been diagnosed with hyperthyroidism in 01/2020 with a suppressed TSH < 0.005 uIU/mL with an elevated T4 and FT3 at 7.77 ng/dL and 30.8 pg/mL respectively.   She had COVID infection 2 weeks prior to this. She was having fatigue, weight loss and palpitations, was having constipation and dysphagia.    Denies local neck symptoms   Pt was started on Methimazole 03/14/2020 and Metoprolol.    Currently on Methimazole 10 mg TID  She is also on Metoprolol 25 mg daily    Today she is having fatigue that she attributes to Metoprolol.    Weight has been fluctuating.  She continues with palpitations  Denies tremors or diarrhea.   Denies Biotin   No Fh of thyroid disease   HISTORY:  Past Medical History:  Past Medical History:  Diagnosis Date  . Anemia   . Chest tightness 11/14/2017  . Cigarette smoker 11/14/2017  . Iron deficiency anemia 01/14/2016  . Vitamin D deficiency 01/14/2016    Past Surgical History:  Past Surgical History:  Procedure Laterality Date  . ESSURE TUBAL LIGATION        Social History:  reports that she has never smoked. She has never used smokeless tobacco. She reports current alcohol use. She reports that she does not use drugs.  Family History: family history includes Cancer in her father; Diabetes in her mother; Heart disease in her mother; Heart failure in her mother; Hypertension in her mother.   HOME MEDICATIONS: Allergies as of 03/21/2020   No Known Allergies     Medication List       Accurate as  of March 21, 2020  7:13 AM. If you have any questions, ask your nurse or doctor.        hydrOXYzine 25 MG capsule Commonly known as: Vistaril Take 1 capsule (25 mg total) by mouth 3 (three) times daily as needed.   ibuprofen 600 MG tablet Commonly known as: ADVIL Take 1 tablet (600 mg total) by mouth every 6 (six) hours as needed.   methimazole 10 MG tablet Commonly known as: TAPAZOLE Take 1 tablet (10 mg total) by mouth 3 (three) times daily.   metoprolol succinate 25 MG 24 hr tablet Commonly known as: Toprol XL Take 1 tablet (25 mg total) by mouth daily.         REVIEW OF SYSTEMS: A comprehensive ROS was conducted with the patient and is negative except as per HPI   OBJECTIVE:  VS: BP 124/82   Pulse 79   Ht 5\' 4"  (1.626 m)   Wt 174 lb 6 oz (79.1 kg)   SpO2 99%   BMI 29.93 kg/m     Wt Readings from Last 3 Encounters:  03/10/20 175 lb (79.4 kg)  02/22/20 173 lb (78.5 kg)  02/16/20 174 lb (78.9 kg)     EXAM: General: Pt appears well and is in NAD  Neck: General: Supple without adenopathy. Thyroid: Thyroid size ~ 60 grams.  No goiter or nodules appreciated. No thyroid bruit.  Lungs: Clear with good BS bilat with no rales, rhonchi, or wheezes  Heart: Auscultation: RRR.  Abdomen: Normoactive bowel sounds, soft, nontender, without masses or organomegaly palpable  Extremities:  BL LE: No pretibial edema normal ROM and strength.  Skin: Hair: Texture and amount normal with gender appropriate distribution Skin Inspection: No rashes Skin Palpation: Skin temperature, texture, and thickness normal to palpation  Neuro: Cranial nerves: II - XII grossly intact  Motor: Normal strength throughout DTRs: 2+ and symmetric in UE without delay in relaxation phase  Mental Status: Judgment, insight: Intact Memory: Intact for recent and remote events Mood and affect: No depression, anxiety, or agitation     DATA REVIEWED: Results for Katelyn Soto, Katelyn Soto (MRN [id number]) as of  03/22/2020 11:58  Ref. Range 03/21/2020 08:41  WBC Latest Ref Range: 3.8 - 10.8 Thousand/uL 6.6  RBC Latest Ref Range: 3.80 - 5.10 Million/uL 5.72 (H)  Hemoglobin Latest Ref Range: 11.7 - 15.5 g/dL 40.9  HCT Latest Ref Range: 35 - 45 % 39.6  MCV Latest Ref Range: 80.0 - 100.0 fL 69.2 (L)  MCH Latest Ref Range: 27.0 - 33.0 pg 22.2 (L)  MCHC Latest Ref Range: 32.0 - 36.0 g/dL 81.1  RDW Latest Ref Range: 11.0 - 15.0 % 14.4  Platelets Latest Ref Range: 140 - 400 Thousand/uL 218  MPV Latest Ref Range: 7.5 - 12.5 fL 11.9  Neutrophils Latest Units: % 56.1  Monocytes Relative Latest Units: % 7.1  Eosinophil Latest Units: % 3.9  Basophil Latest Units: % 0.3  NEUT# Latest Ref Range: 1,500 - 7,800 cells/uL 3,703  Lymphocyte # Latest Ref Range: 850 - 3,900 cells/uL 2,152  Total Lymphocyte Latest Units: % 32.6  Eosinophils Absolute Latest Ref Range: 15.0 - 500.0 cells/uL 257  Basophils Absolute Latest Ref Range: 0.0 - 200.0 cells/uL 20  Smear Review Unknown Pend  Absolute Monocytes Latest Ref Range: 200 - 950 cells/uL 469  TSH Latest Units: mIU/L <0.01 (L)  T4,Free(Direct) Latest Ref Range: 0.8 - 1.8 ng/dL 2.0 (H)    Results for Katelyn Soto, Katelyn Soto (MRN [id number]) as of 03/22/2020 11:58  Ref. Range 02/10/2020 10:12 03/21/2020 08:41  TSH Latest Units: mIU/L <0.005 (L) <0.01 (L)  Triiodothyronine,Free,Serum Latest Ref Range: 2.0 - 4.4 pg/mL 22.8 (HH)   T4,Free(Direct) Latest Ref Range: 0.8 - 1.8 ng/dL >2.13 (H) 2.0 (H)    ASSESSMENT/PLAN/RECOMMENDATIONS:   1. Hyperthyroidism:  - She is clinically euthyroid  - We discussed D/D of Graves; disease vs toxic nodule(s) vs subacute thyroiditis -We discussed that Graves' Disease is a result of an autoimmune condition involving the thyroid.    We discussed with pt the benefits of methimazole in the Tx of hyperthyroidism, as well as the possible side effects/complications of anti-thyroid drug Tx (specifically detailing the rare, but serious side effect of  agranulocytosis). She was informed of need for regular thyroid function monitoring while on methimazole to ensure appropriate dosage without over-treatment. As well, we discussed the possible side effects of methimazole including the chance of rash, the small chance of liver irritation/juandice and the <=1 in 300-400 chance of sudden onset agranulocytosis.  We discussed importance of going to ED promptly (and stopping methimazole) if shewere to develop significant fever with severe sore throat of other evidence of acute infection.      We extensively discussed the various treatment options for hyperthyroidism and Graves disease including ablation therapy with radioactive iodine versus antithyroid drug treatment versus surgical therapy.  We recommended to the patient that we felt, at  this time, that thionamide therapy would be most optimal.  We discussed the various possible benefits versus side effects of the various therapies.   - TRAb pending   Medications : Decrease Methimazole 10 mg, to BID Continue Toprol XL 25 mg daily    F/U in 3 months Labs in 6 weeks    Addendum: results discussed with the pt and advised to reduce methimazole from TID to BID dosing on 03/22/2020 at Noon    I spent 45 minutes preparing to see the patient by review of recent labs, imaging and procedures, obtaining and reviewing separately obtained history, communicating with the patient, ordering medications, tests or procedures, and documenting clinical information in the EHR including the differential Dx, treatment, and any further evaluation and other management   Signed electronically by: Lyndle Herrlich, MD  [facility]  [facility] [address] [place], [place] [address] Phone: [phone] FAX: [phone]   CC: Barbette Merino, NP [address] [place] [address] Phone: [phone] Fax: [phone]   Return to Endocrinology clinic as  below: Future Appointments  Date Time Provider Department Center  03/21/2020  7:50 AM Juliahna Wiswell, Konrad Dolores, MD [facility]  03/29/2020  4:20 PM Revankar, Aundra Dubin, MD [facility] None  06/02/2020 10:00 AM [facility] None  06/07/2020  8:20 AM Barbette Merino, NP [facility] None

## 2020-03-21 NOTE — Patient Instructions (Signed)
We recommend that you follow these hyperthyroidism instructions at home:  1) Take Methimazole 10 mg three times a day  If you develop severe sore throat with high fevers OR develop unexplained yellowing of your skin, eyes, under your tongue, severe abdominal pain with nausea or vomiting --> then please get evaluated immediately.  2) Metoprolol 25 mg 1 times a day 3) Get repeat thyroid labs 6 weeks.   It is ESSENTIAL to get follow-up labs to help avoid over or undertreatment of your hyperthyroidism - both of which can be dangerous to your health.

## 2020-03-22 MED ORDER — METHIMAZOLE 10 MG PO TABS: 10.0000 mg | ORAL_TABLET | Freq: Two times a day (BID) | ORAL | 1 refills | Status: DC

## 2020-03-24 LAB — CBC WITH DIFFERENTIAL/PLATELET
Absolute Monocytes: 469 cells/uL (ref 200–950)
Basophils Absolute: 20 cells/uL (ref 0–200)
Basophils Relative: 0.3 %
Eosinophils Absolute: 257 cells/uL (ref 15–500)
Eosinophils Relative: 3.9 %
HCT: 39.6 % (ref 35.0–45.0)
Hemoglobin: 12.7 g/dL (ref 11.7–15.5)
Lymphs Abs: 2152 cells/uL (ref 850–3900)
MCH: 22.2 pg — ABNORMAL LOW (ref 27.0–33.0)
MCHC: 32.1 g/dL (ref 32.0–36.0)
MCV: 69.2 fL — ABNORMAL LOW (ref 80.0–100.0)
MPV: 11.9 fL (ref 7.5–12.5)
Monocytes Relative: 7.1 %
Neutro Abs: 3703 cells/uL (ref 1500–7800)
Neutrophils Relative %: 56.1 %
Platelets: 218 10*3/uL (ref 140–400)
RBC: 5.72 10*6/uL — ABNORMAL HIGH (ref 3.80–5.10)
RDW: 14.4 % (ref 11.0–15.0)
Total Lymphocyte: 32.6 %
WBC: 6.6 10*3/uL (ref 3.8–10.8)

## 2020-03-24 LAB — T4, FREE: Free T4: 2 ng/dL — ABNORMAL HIGH (ref 0.8–1.8)

## 2020-03-24 LAB — TSH: TSH: <0.01 mIU/L — ABNORMAL LOW

## 2020-03-24 LAB — TRAB (TSH RECEPTOR BINDING ANTIBODY): TRAB: 29.2 IU/L — ABNORMAL HIGH (ref <=2.00)

## 2020-03-28 ENCOUNTER — Other Ambulatory Visit: Payer: Self-pay

## 2020-03-29 ENCOUNTER — Ambulatory Visit (INDEPENDENT_AMBULATORY_CARE_PROVIDER_SITE_OTHER): Payer: Medicaid Other | Admitting: Cardiology

## 2020-03-29 ENCOUNTER — Other Ambulatory Visit: Payer: Self-pay

## 2020-03-29 ENCOUNTER — Encounter: Payer: Self-pay | Admitting: Cardiology

## 2020-03-29 VITALS — BP 122/73 | HR 78 | Ht 64.0 in | Wt 176.1 lb

## 2020-03-29 DIAGNOSIS — R002 Palpitations: Secondary | ICD-10-CM | POA: Diagnosis not present

## 2020-03-29 DIAGNOSIS — R0789 Other chest pain: Secondary | ICD-10-CM

## 2020-03-29 NOTE — Progress Notes (Signed)
Cardiology Office Note:    Date:  03/29/2020   ID:  Katelyn Soto, DOB September 04, 1980, MRN [id number]  PCP:  Barbette Merino, NP  Cardiologist:  Garwin Brothers, MD   Referring MD: Noland Fordyce, MD    ASSESSMENT:    No diagnosis found. PLAN:    In order of problems listed above:  1. Palpitations: I discussed my findings with the patient at length.  These have resolved significantly and the patient is willing much more better at this time.  She is happy about it.  Results of monitoring and echocardiogram discussed with her at length. 2. Hypothyroidism: Managed by primary care physician and she is responding well to treatment. 3. Ex-smoker: She quit smoking earlier this year and plans never to go back.  I congratulated her about this. 4. Patient will be seen in follow-up appointment in 6 months or earlier if the patient has any concerns    Medication Adjustments/Labs and Tests Ordered: Current medicines are reviewed at length with the patient today.  Concerns regarding medicines are outlined above.  No orders of the defined types were placed in this encounter.  No orders of the defined types were placed in this encounter.    No chief complaint on file.    History of Present Illness:    Katelyn Soto is a 39 y.o. female.  Patient has past medical history of palpitations and was referred to me.  She was a smoker but quit a year ago.  She was evaluated for palpitations.  I found that her TSH was very low and subsequently she is being treated for hyperthyroidism tells me that she feels much better.  No chest pain orthopnea or PND.  At the time of my evaluation, the patient is alert awake oriented and in no distress.  Past Medical History:  Diagnosis Date  . Anemia   . Cardiac murmur 02/10/2020  . Chest tightness 11/14/2017  . Cigarette smoker 11/14/2017  . History of COVID-19 02/10/2020  . Hyperthyroidism 03/10/2020  . Iron deficiency anemia 01/14/2016  . Palpitations 02/10/2020    . Shortness of breath 02/10/2020  . Tachycardia 02/10/2020  . Vitamin D deficiency 01/14/2016    Past Surgical History:  Procedure Laterality Date  . ESSURE TUBAL LIGATION      Current Medications: Current Meds  Medication Sig  . buPROPion (WELLBUTRIN SR) 150 MG 12 hr tablet Take 150 mg by mouth daily.   . hydrOXYzine (VISTARIL) 25 MG capsule Take 1 capsule (25 mg total) by mouth 3 (three) times daily as needed.  Marland Kitchen ibuprofen (ADVIL) 600 MG tablet Take 1 tablet (600 mg total) by mouth every 6 (six) hours as needed.  Marland Kitchen ibuprofen (ADVIL) 800 MG tablet Take 800 mg by mouth 3 (three) times daily.  . methimazole (TAPAZOLE) 10 MG tablet Take 1 tablet (10 mg total) by mouth 2 (two) times daily.  . metoprolol succinate (TOPROL XL) 25 MG 24 hr tablet Take 1 tablet (25 mg total) by mouth daily.     Allergies:   Patient has no known allergies.   Social History   Socioeconomic History  . Marital status: Legally Separated    Spouse name: Not on file  . Number of children: Not on file  . Years of education: Not on file  . Highest education level: Not on file  Occupational History  . Not on file  Tobacco Use  . Smoking status: Never Smoker  . Smokeless tobacco: Never Used  Vaping Use  .  Vaping Use: Never used  Substance and Sexual Activity  . Alcohol use: Yes    Comment: socially  . Drug use: No  . Sexual activity: Not on file  Other Topics Concern  . Not on file  Social History Narrative  . Not on file   Social Determinants of Health   Financial Resource Strain:   . Difficulty of Paying Living Expenses: Not on file  Food Insecurity:   . Worried About Programme researcher, broadcasting/film/video in the Last Year: Not on file  . Ran Out of Food in the Last Year: Not on file  Transportation Needs:   . Lack of Transportation (Medical): Not on file  . Lack of Transportation (Non-Medical): Not on file  Physical Activity:   . Days of Exercise per Week: Not on file  . Minutes of Exercise per Session: Not  on file  Stress:   . Feeling of Stress : Not on file  Social Connections:   . Frequency of Communication with Friends and Family: Not on file  . Frequency of Social Gatherings with Friends and Family: Not on file  . Attends Religious Services: Not on file  . Active Member of Clubs or Organizations: Not on file  . Attends Banker Meetings: Not on file  . Marital Status: Not on file     Family History: The patient's family history includes Cancer in her father; Diabetes in her mother; Heart disease in her mother; Heart failure in her mother; Hypertension in her mother.  ROS:   Please see the history of present illness.    All other systems reviewed and are negative.  EKGs/Labs/Other Studies Reviewed:    The following studies were reviewed today: IMPRESSIONS    1. Left ventricular ejection fraction, by estimation, is 60 to 65%. The  left ventricle has normal function. The left ventricle has no regional  wall motion abnormalities. Left ventricular diastolic parameters were  normal.  2. Right ventricular systolic function is normal. The right ventricular  size is normal.  3. The mitral valve is normal in structure. No evidence of mitral valve  regurgitation. No evidence of mitral stenosis.  4. The aortic valve is normal in structure. Aortic valve regurgitation is  not visualized. No aortic stenosis is present.  5. The inferior vena cava is normal in size with greater than 50%  respiratory variability, suggesting right atrial pressure of 3 mmHg.    Recent Labs: 02/09/2020: ALT 42 02/22/2020: BUN 9; Creatinine, Ser 0.46; Potassium 4.0; Sodium 137 03/21/2020: Hemoglobin 12.7; Platelets 218; TSH <0.01  Recent Lipid Panel    Component Value Date/Time   CHOL 166 11/14/2017 1015   TRIG 55 11/14/2017 1015   HDL 40 11/14/2017 1015   CHOLHDL 4.2 11/14/2017 1015   LDLCALC 115 (H) 11/14/2017 1015    Physical Exam:    VS:  BP 122/73   Pulse 78   Ht 5\' 4"  (1.626  m)   Wt 176 lb 1.3 oz (79.9 kg)   SpO2 98%   BMI 30.22 kg/m     Wt Readings from Last 3 Encounters:  03/29/20 176 lb 1.3 oz (79.9 kg)  03/21/20 174 lb 6 oz (79.1 kg)  03/10/20 175 lb (79.4 kg)     GEN: Patient is in no acute distress HEENT: Normal NECK: No JVD; No carotid bruits LYMPHATICS: No lymphadenopathy CARDIAC: Hear sounds regular, 2/6 systolic murmur at the apex. RESPIRATORY:  Clear to auscultation without rales, wheezing or rhonchi  ABDOMEN: Soft, non-tender,  non-distended MUSCULOSKELETAL:  No edema; No deformity  SKIN: Warm and dry NEUROLOGIC:  Alert and oriented x 3 PSYCHIATRIC:  Normal affect   Signed, Garwin Brothers, MD  03/29/2020 4:45 PM    [facility]

## 2020-03-29 NOTE — Patient Instructions (Signed)

## 2020-05-02 ENCOUNTER — Other Ambulatory Visit: Payer: Medicaid Other

## 2020-05-08 ENCOUNTER — Other Ambulatory Visit: Payer: Medicaid Other

## 2020-05-10 ENCOUNTER — Other Ambulatory Visit (INDEPENDENT_AMBULATORY_CARE_PROVIDER_SITE_OTHER): Payer: Medicaid Other

## 2020-05-10 ENCOUNTER — Other Ambulatory Visit: Payer: Self-pay

## 2020-05-10 DIAGNOSIS — E0590 Thyrotoxicosis, unspecified without thyrotoxic crisis or storm: Secondary | ICD-10-CM | POA: Diagnosis not present

## 2020-05-10 LAB — TSH: TSH: <0.01 u[IU]/mL — ABNORMAL LOW (ref 0.35–4.50)

## 2020-05-10 LAB — T4, FREE: Free T4: 0.64 ng/dL (ref 0.60–1.60)

## 2020-05-10 NOTE — Addendum Note (Signed)
Addended by: Rosita Kea on: 05/10/2020 10:48 AM   Modules accepted: Orders

## 2020-06-02 ENCOUNTER — Other Ambulatory Visit: Payer: Self-pay

## 2020-06-02 ENCOUNTER — Ambulatory Visit (INDEPENDENT_AMBULATORY_CARE_PROVIDER_SITE_OTHER): Payer: 59 | Admitting: Nurse Practitioner

## 2020-06-02 VITALS — BP 118/82 | HR 67 | Temp 96.8°F | Ht 64.0 in | Wt 189.5 lb

## 2020-06-02 DIAGNOSIS — R000 Tachycardia, unspecified: Secondary | ICD-10-CM

## 2020-06-02 DIAGNOSIS — E0590 Thyrotoxicosis, unspecified without thyrotoxic crisis or storm: Secondary | ICD-10-CM

## 2020-06-02 DIAGNOSIS — Z8616 Personal history of COVID-19: Secondary | ICD-10-CM

## 2020-06-02 NOTE — Progress Notes (Signed)
@Patient  ID: , female    DOB: 04-04-1981, 40 y.o.   MRN: [id number]  Chief Complaint  Patient presents with  . Follow-up    3 month follow up, Feeling overall better.     Referring provider: [id number], NP   40 year old female with history of anemia, vitamin D deficiency. Diagnosed with Covid June 2021.  HPI   Patient presents today for post COVID care clinic visit/follow-up.  She was last seen here on 03/02/2020.  Patient has been seen followed by cardiology for tachycardia.  Her heart rate is back to normal.  She is currently on Toprol.  Patient has also been seen by endocrinology for abnormal thyroid.  This is now much improved on Tapazole.  Patient does have upcoming follow-up with them.  She is also being followed by 05/02/2020 for PCP.  Patient states that her loss of taste and smell has resolved.  Patient states that she does have periods of anxiety.  She has been started on Wellbutrin by her OB/GYN but is in the process of changing doctors.  She does have an upcoming appointment with PCP next week and will discuss anxiety with her PCP.  She does have hydroxyzine to use as needed.  Overall patient is doing well and is much improved.  She is now getting the medical care that she needs.  We discussed that she will only need to follow-up with [facility] as needed at this point.  Denies f/c/s, n/v/d, hemoptysis, PND, chest pain or edema.    No Known Allergies   There is no immunization history on file for this patient.  Past Medical History:  Diagnosis Date  . Anemia   . Cardiac murmur 02/10/2020  . Chest tightness 11/14/2017  . Cigarette smoker 11/14/2017  . History of COVID-19 02/10/2020  . Hyperthyroidism 03/10/2020  . Iron deficiency anemia 01/14/2016  . Palpitations 02/10/2020  . Shortness of breath 02/10/2020  . Tachycardia 02/10/2020  . Vitamin D deficiency 01/14/2016    Tobacco History: Social History   Tobacco Use  Smoking Status Never  Smoker  Smokeless Tobacco Never Used   Counseling given: Yes   Outpatient Encounter Medications as of 06/02/2020  Medication Sig  . buPROPion (WELLBUTRIN SR) 150 MG 12 hr tablet Take 150 mg by mouth daily.   . hydrOXYzine (VISTARIL) 25 MG capsule Take 1 capsule (25 mg total) by mouth 3 (three) times daily as needed.  07/31/2020 ibuprofen (ADVIL) 600 MG tablet Take 1 tablet (600 mg total) by mouth every 6 (six) hours as needed.  Marland Kitchen ibuprofen (ADVIL) 800 MG tablet Take 800 mg by mouth 3 (three) times daily.  . methimazole (TAPAZOLE) 10 MG tablet Take 1 tablet (10 mg total) by mouth 2 (two) times daily.  . metoprolol succinate (TOPROL XL) 25 MG 24 hr tablet Take 1 tablet (25 mg total) by mouth daily.  . [DISCONTINUED] sertraline (ZOLOFT) 100 MG tablet Take 100 mg by mouth daily.   No facility-administered encounter medications on file as of 06/02/2020.     Review of Systems  Review of Systems  Constitutional: Negative.  Negative for fatigue and fever.  HENT: Negative.   Respiratory: Negative for cough and shortness of breath.   Cardiovascular: Negative.  Negative for chest pain, palpitations and leg swelling.  Gastrointestinal: Negative.   Allergic/Immunologic: Negative.   Neurological: Negative.   Psychiatric/Behavioral: Negative.        Physical Exam  BP 118/82 (BP Location: Right Arm)  Pulse 67   Temp (!) 96.8 F (36 C)   Ht 5\' 4"  (1.626 m)   Wt 189 lb 8 oz (86 kg)   SpO2 99%   BMI 32.53 kg/m   Wt Readings from Last 5 Encounters:  06/02/20 189 lb 8 oz (86 kg)  03/29/20 176 lb 1.3 oz (79.9 kg)  03/21/20 174 lb 6 oz (79.1 kg)  03/10/20 175 lb (79.4 kg)  02/22/20 173 lb (78.5 kg)     Physical Exam Vitals and nursing note reviewed.  Constitutional:      General: She is not in acute distress.    Appearance: She is well-developed and well-nourished.  Cardiovascular:     Rate and Rhythm: Normal rate and regular rhythm.  Pulmonary:     Effort: Pulmonary effort is normal.      Breath sounds: Normal breath sounds.  Musculoskeletal:     Right lower leg: No edema.     Left lower leg: No edema.  Neurological:     Mental Status: She is alert and oriented to person, place, and time.  Psychiatric:        Mood and Affect: Mood and affect and mood normal.        Behavior: Behavior normal.        Assessment & Plan:   History of COVID-19 Tachycardia:  Please keep upcoming follow up visit with cardiology  Stay active  Stay well hydrated  Continue vitamin C, zinc, vitamin D3  Please avoid caffeine   Abnormal Thyroid testing:  Please keep upcoming appointment with Endocrinology   May start Gluten free diet   Headache:  May take tylenol as needed  May start magnesium  Loss of taste and Smell:  Improving  May try on-line olfactory re-training using essential oils  Anxiety:  Continue vistaril to take as needed  May start Wellbutrin as prescribed by OB/GYN  Rash:  May use benadryl cream  May start zyrtec daily  Follow up:  Follow up in 3 months or sooner if needed       June, NP 06/02/2020

## 2020-06-02 NOTE — Assessment & Plan Note (Signed)
Tachycardia:  Please keep upcoming follow up visit with cardiology  Stay active  Stay well hydrated  Continue vitamin C, zinc, vitamin D3  Please avoid caffeine   Abnormal Thyroid testing:  Please keep upcoming appointment with Endocrinology   May start Gluten free diet   Headache:  May take tylenol as needed  May start magnesium  Loss of taste and Smell:  Improving  May try on-line olfactory re-training using essential oils  Anxiety:  Continue vistaril to take as needed  May start Wellbutrin as prescribed by OB/GYN  Rash:  May use benadryl cream  May start zyrtec daily  Follow up:  Follow up in 3 months or sooner if needed  

## 2020-06-02 NOTE — Patient Instructions (Signed)
History of COVID-19 Tachycardia:  Please keep upcoming follow up visit with cardiology  Stay active  Stay well hydrated  Continue vitamin C, zinc, vitamin D3  Please avoid caffeine   Abnormal Thyroid testing:  Please keep upcoming appointment with Endocrinology   May start Gluten free diet   Headache:  May take tylenol as needed  May start magnesium  Loss of taste and Smell:  Improving  May try on-line olfactory re-training using essential oils  Anxiety:  Continue vistaril to take as needed  May start Wellbutrin as prescribed by OB/GYN  Rash:  May use benadryl cream  May start zyrtec daily  Follow up:  Follow up in 3 months or sooner if needed

## 2020-06-07 ENCOUNTER — Encounter: Payer: Self-pay | Admitting: Nurse Practitioner

## 2020-06-07 ENCOUNTER — Telehealth (INDEPENDENT_AMBULATORY_CARE_PROVIDER_SITE_OTHER): Payer: 59 | Admitting: Nurse Practitioner

## 2020-06-07 VITALS — BP 112/81 | HR 71 | Temp 96.8°F | Ht 64.0 in | Wt 185.0 lb

## 2020-06-07 DIAGNOSIS — R000 Tachycardia, unspecified: Secondary | ICD-10-CM | POA: Diagnosis not present

## 2020-06-07 DIAGNOSIS — E0590 Thyrotoxicosis, unspecified without thyrotoxic crisis or storm: Secondary | ICD-10-CM

## 2020-06-07 DIAGNOSIS — F329 Major depressive disorder, single episode, unspecified: Secondary | ICD-10-CM | POA: Diagnosis not present

## 2020-06-07 MED ORDER — BUPROPION HCL ER (SR) 150 MG PO TB12: 150.0000 mg | ORAL_TABLET | Freq: Every day | ORAL | 3 refills | Status: DC

## 2020-06-07 NOTE — Progress Notes (Signed)
   [facility] Patient [facility] [address] [place], [place]  [address] Phone:  [phone]   Fax:  [phone] Virtual Visit via Telephone Note  I connected with Necole Minassian Wittler on 06/07/20 at  8:20 AM EST by telephone and verified that I am speaking with the correct person using two identifiers.   I discussed the limitations, risks, security and privacy concerns of performing an evaluation and management service by telephone and the availability of in person appointments. I also discussed with the patient that there may be a patient responsible charge related to this service. The patient expressed understanding and agreed to proceed.  Patient home Provider Office  History of Present Illness:  She is seen today for a follow up. She  has a past medical history of Anemia, Cardiac murmur (02/10/2020), Chest tightness (11/14/2017), Cigarette smoker (11/14/2017), History of COVID-19 (02/10/2020), Hyperthyroidism (03/10/2020), Iron deficiency anemia (01/14/2016), Palpitations (02/10/2020), Shortness of breath (02/10/2020), Tachycardia (02/10/2020), and Vitamin D deficiency (01/14/2016).   She was diagnosed with depression currently on Wellbutrin 150 mg daily. She is currently doing well. She feels better that she is back at work and providing for her family. She is not having to use the hydroxyzine 25 mg due to the Wellbutrin being effective.  She has occasional headaches and pressure behind her eyes. Denies dizziness,  shortness of breath, dyspnea on exertion, chest pain, nausea, vomiting or any edema.  She has followed up with endocrinology ,decreased Methimazole 15 mg daily . She will follow up in the future for labs.  She has also followed up with cardiology, she continues on Metoprolol 25 mg daily.   Observations/Objective: No exam today due to virtual visit.   Assessment and Plan: Assessment  Primary Diagnosis & Pertinent Problem List: The primary encounter diagnosis was Reactive depression.  Diagnoses of Hyperthyroidism and Tachycardia were also pertinent to this visit.  Visit Diagnosis: 1. Reactive depression  Stable will continue on Wellbutrin 150 mg daily  2. Hyperthyroidism  Stable Methimazole 15 mg daily  3. Tachycardia  Stable Metoprolol 25 mg daily    Follow Up Instructions: FU 2 weeks mammogram on Jan 28 and removal of nexaplanon (PAP has already been completed) thanks [id number]   I discussed the assessment and treatment plan with the patient. The patient was provided an opportunity to ask questions and all were answered. The patient agreed with the plan and demonstrated an understanding of the instructions.   The patient was advised to call back or seek an in-person evaluation if the symptoms worsen or if the condition fails to improve as anticipated.  I provided 9:36 minutes of non-face-to-face time during this encounter.   Barbette Merino, NP

## 2020-06-14 ENCOUNTER — Other Ambulatory Visit: Payer: Self-pay | Admitting: Nurse Practitioner

## 2020-06-14 DIAGNOSIS — Z1231 Encounter for screening mammogram for malignant neoplasm of breast: Secondary | ICD-10-CM

## 2020-06-21 ENCOUNTER — Ambulatory Visit (INDEPENDENT_AMBULATORY_CARE_PROVIDER_SITE_OTHER): Payer: PRIVATE HEALTH INSURANCE | Admitting: Nurse Practitioner

## 2020-06-21 DIAGNOSIS — E0590 Thyrotoxicosis, unspecified without thyrotoxic crisis or storm: Secondary | ICD-10-CM

## 2020-06-23 ENCOUNTER — Ambulatory Visit
Admission: RE | Admit: 2020-06-23 | Discharge: 2020-06-23 | Disposition: A | Discharge status: Home / Self Care | Payer: Medicaid Other | Source: Ambulatory Visit | Attending: Nurse Practitioner | Admitting: Nurse Practitioner

## 2020-06-23 ENCOUNTER — Other Ambulatory Visit: Payer: Self-pay | Admitting: Nurse Practitioner

## 2020-06-23 ENCOUNTER — Other Ambulatory Visit: Payer: Self-pay

## 2020-06-23 ENCOUNTER — Ambulatory Visit
Admission: RE | Admit: 2020-06-23 | Discharge: 2020-06-23 | Disposition: A | Discharge status: Home / Self Care | Payer: PRIVATE HEALTH INSURANCE | Source: Ambulatory Visit | Attending: Nurse Practitioner | Admitting: Nurse Practitioner

## 2020-06-23 DIAGNOSIS — Z1231 Encounter for screening mammogram for malignant neoplasm of breast: Secondary | ICD-10-CM

## 2020-06-27 ENCOUNTER — Ambulatory Visit: Payer: PRIVATE HEALTH INSURANCE | Admitting: Internal Medicine

## 2020-06-27 NOTE — Progress Notes (Deleted)
Name: Katelyn Soto  MRN/ DOB: [id number], 1980/11/02    Age/ Sex: 40 y.o., female     PCP: Barbette Merino, NP   Reason for Endocrinology Evaluation: Hyperthyroidism     Initial Endocrinology Clinic Visit: 03/21/2020    PATIENT IDENTIFIER: Katelyn Soto is a 40 y.o., female with a past medical history of anemia and hyperthyroidism.  She has followed with [facility] since 03/21/2020 for consultative assistance with management of her hyperthyroidism  HISTORICAL SUMMARY:    She has been diagnosed with hyperthyroidism in 01/2020 with a suppressed TSH < 0.005 uIU/mL with an elevated T4 and FT3 at 7.77 ng/dL and 95.6 pg/mL respectively.   She had COVID infection 2 weeks prior to this. She was having fatigue, weight loss and palpitations, was having constipation and dysphagia.     Pt was started on Methimazole 03/14/2020 and Metoprolol.  TRAB elevated at 29.20 IU/L  No Fh of thyroid disease  SUBJECTIVE:    Today (06/27/2020):  Katelyn Soto is here for hyperthyroidism.       HOME ENDOCRINE MEDICATIONS: Methimazole 10 mg, 1.5 tabs daily  Continue Toprol XL 25 mg daily      HISTORY:  Past Medical History:  Past Medical History:  Diagnosis Date  . Anemia   . Cardiac murmur 02/10/2020  . Chest tightness 11/14/2017  . Cigarette smoker 11/14/2017  . History of COVID-19 02/10/2020  . Hyperthyroidism 03/10/2020  . Iron deficiency anemia 01/14/2016  . Palpitations 02/10/2020  . Shortness of breath 02/10/2020  . Tachycardia 02/10/2020  . Vitamin D deficiency 01/14/2016    Past Surgical History:  Past Surgical History:  Procedure Laterality Date  . ESSURE TUBAL LIGATION       Social History:  reports that she has never smoked. She has never used smokeless tobacco. She reports current alcohol use. She reports that she does not use drugs. Family History:  Family History  Problem Relation Age of Onset  . Hypertension Mother   . Diabetes Mother   . Heart  disease Mother   . Heart failure Mother   . Cancer Father        throat      HOME MEDICATIONS: Allergies as of 06/27/2020   No Known Allergies     Medication List       Accurate as of June 27, 2020  7:35 AM. If you have any questions, ask your nurse or doctor.        buPROPion 150 MG 12 hr tablet Commonly known as: WELLBUTRIN SR Take 1 tablet (150 mg total) by mouth daily.   hydrOXYzine 25 MG capsule Commonly known as: Vistaril Take 1 capsule (25 mg total) by mouth 3 (three) times daily as needed.   ibuprofen 800 MG tablet Commonly known as: ADVIL Take 800 mg by mouth 3 (three) times daily.   methimazole 10 MG tablet Commonly known as: TAPAZOLE Take 1 tablet (10 mg total) by mouth 2 (two) times daily.   metoprolol succinate 25 MG 24 hr tablet Commonly known as: Toprol XL Take 1 tablet (25 mg total) by mouth daily.         OBJECTIVE:   PHYSICAL EXAM: VS: There were no vitals taken for this visit.   EXAM: General: Pt appears well and is in NAD  Hydration: Well-hydrated with moist mucous membranes and good skin turgor  Eyes: External eye exam normal without stare, lid lag or exophthalmos.  EOM intact.  PERRL.  Ears, Nose, Throat:  Hearing: Grossly intact bilaterally Dental: Good dentition  Throat: Clear without mass, erythema or exudate  Neck: General: Supple without adenopathy. Thyroid: Thyroid size normal.  No goiter or nodules appreciated. No thyroid bruit.  Lungs: Clear with good BS bilat with no rales, rhonchi, or wheezes  Heart: Auscultation: RRR.  Abdomen: Normoactive bowel sounds, soft, nontender, without masses or organomegaly palpable  Extremities: Gait and station: Normal gait  Digits and nails: No clubbing, cyanosis, petechiae, or nodes Head and neck: Normal alignment and mobility BL UE: Normal ROM and strength. BL LE: No pretibial edema normal ROM and strength.  Skin: Hair: Texture and amount normal with gender appropriate  distribution Skin Inspection: No rashes, acanthosis nigricans/skin tags. No lipohypertrophy Skin Palpation: Skin temperature, texture, and thickness normal to palpation  Neuro: Cranial nerves: II - XII grossly intact  Cerebellar: Normal coordination and movement; no tremor Motor: Normal strength throughout DTRs: 2+ and symmetric in UE without delay in relaxation phase  Mental Status: Judgment, insight: Intact Orientation: Oriented to time, place, and person Memory: Intact for recent and remote events Mood and affect: No depression, anxiety, or agitation     DATA REVIEWED: ***  Results for Katelyn Soto, Katelyn Soto (MRN [id number]) as of 06/27/2020 07:41  Ref. Range 03/21/2020 08:41  TRAB Latest Ref Range: <=2.00 IU/L 29.20 (H)     Thyroid ultrasound 02/24/2020 Nonspecific thyroid heterogeneity compatible with medical thyroid disease. No other significant finding or nodule by ultrasound.  ASSESSMENT / PLAN / RECOMMENDATIONS:   1. Hyperthyroidism secondary to Graves' Disease  Plan:  ***    Medications   2. Graves' disease:   - NO extrathyroidal manifestations of Graves' disease     Signed electronically by: Lyndle Herrlich, MD  [facility] Endocrinology  [facility] [address] [place] [place], [place] [address] Phone: [phone] FAX: [phone]      CC: Barbette Merino, NP [address] [place] [address] Phone: [phone]  Fax: [phone]   Return to Endocrinology clinic as below: Future Appointments  Date Time Provider Department Center  06/27/2020  7:50 AM Gwenn Teodoro, Konrad Dolores, MD [id number]

## 2020-07-03 NOTE — Progress Notes (Signed)
Name: Katelyn Soto  MRN/ DOB: [id number], [birth year]    Age/ Sex: 40 y.o., female     PCP: Barbette Merino, NP   Reason for Endocrinology Evaluation: Hyperthyroidism     Initial Endocrinology Clinic Visit: 03/21/2020    PATIENT IDENTIFIER: Katelyn Soto is a 40 y.o., female with a past medical history of anemia and hyperthyroidism.  She has followed with [place] [facility] [facility] since 03/21/2020 for consultative assistance with management of her hyperthyroidism  HISTORICAL SUMMARY:    She has been diagnosed with hyperthyroidism in 01/2020 with a suppressed TSH < 0.005 uIU/mL with an elevated T4 and FT3 at 7.77 ng/dL and 68.0 pg/mL respectively.   She had COVID infection 2 weeks prior to this. She was having fatigue, weight loss and palpitations, was having constipation and dysphagia.     Pt was started on Methimazole 03/14/2020 and Metoprolol.  TRAB elevated at 29.20 IU/L  No Fh of thyroid disease  SUBJECTIVE:    Today (07/04/2020):  Katelyn Soto is here for hyperthyroidism.   Pt has been noted with weight gain  Denies fevever Denies constipation ,or vomiting but has been having groin cramps that she attributes to monthly cycles  Has occasional palpitations  LMP  07/02/2020    HOME ENDOCRINE MEDICATIONS: Methimazole 10 mg, 1.5 tabs daily  Toprol XL 25 mg daily      HISTORY:  Past Medical History:  Past Medical History:  Diagnosis Date  . Anemia   . Cardiac murmur 02/10/2020  . Chest tightness 11/14/2017  . Cigarette smoker 11/14/2017  . History of COVID-19 02/10/2020  . Hyperthyroidism 03/10/2020  . Iron deficiency anemia 01/14/2016  . Palpitations 02/10/2020  . Shortness of breath 02/10/2020  . Tachycardia 02/10/2020  . Vitamin D deficiency 01/14/2016   Past Surgical History:  Past Surgical History:  Procedure Laterality Date  . ESSURE TUBAL LIGATION      Social History:  reports that she has never smoked. She has never used smokeless tobacco. She  reports current alcohol use. She reports that she does not use drugs. Family History:  Family History  Problem Relation Age of Onset  . Hypertension Mother   . Diabetes Mother   . Heart disease Mother   . Heart failure Mother   . Cancer Father        throat     HOME MEDICATIONS: Allergies as of 07/04/2020   No Known Allergies     Medication List       Accurate as of July 04, 2020 10:17 AM. If you have any questions, ask your nurse or doctor.        STOP taking these medications   ibuprofen 800 MG tablet Commonly known as: ADVIL Stopped by: Scarlette Shorts, MD     TAKE these medications   buPROPion 150 MG 12 hr tablet Commonly known as: WELLBUTRIN SR Take 1 tablet (150 mg total) by mouth daily.   hydrOXYzine 25 MG capsule Commonly known as: Vistaril Take 1 capsule (25 mg total) by mouth 3 (three) times daily as needed.   methimazole 10 MG tablet Commonly known as: TAPAZOLE Take 1 tablet (10 mg total) by mouth 2 (two) times daily. What changed:   how much to take  how to take this  when to take this   metoprolol succinate 25 MG 24 hr tablet Commonly known as: Toprol XL Take 1 tablet (25 mg total) by mouth daily.         OBJECTIVE:  PHYSICAL EXAM: VS: BP 126/82   Pulse 70   Ht 5\' 4"  (1.626 m)   Wt 191 lb 2 oz (86.7 kg)   SpO2 98%   BMI 32.81 kg/m    EXAM: General: Pt appears well and is in NAD  Eyes: External eye exam normal without stare, lid lag or exophthalmos.  EOM intact.   Ears, Nose, Throat: Hearing: Grossly intact bilaterally Dental: Good dentition  Throat: Clear without mass, erythema or exudate  Neck: General: Supple without adenopathy. Thyroid: Thyroid size normal.  No goiter or nodules appreciated. No thyroid bruit.  Lungs: Clear with good BS bilat with no rales, rhonchi, or wheezes  Heart: Auscultation: RRR.  Abdomen: Normoactive bowel sounds, soft, nontender, without masses or organomegaly palpable  Extremities:  BL  LE: No pretibial edema normal ROM and strength.  Mental Status: Judgment, insight: Intact Orientation: Oriented to time, place, and person Mood and affect: No depression, anxiety, or agitation     DATA REVIEWED: Results for Katelyn, Soto (MRN Sonia Side) as of 07/04/2020 12:47  Ref. Range 07/04/2020 10:28  TSH Latest Ref Range: 0.35 - 4.50 uIU/mL 4.23  T4,Free(Direct) Latest Ref Range: 0.60 - 1.60 ng/dL 09/01/2020    Results for Katelyn, Soto (MRN Sonia Side) as of 06/27/2020 07:41  Ref. Range 03/21/2020 08:41  TRAB Latest Ref Range: <=2.00 IU/L 29.20 (H)    Thyroid ultrasound 02/24/2020 Nonspecific thyroid heterogeneity compatible with medical thyroid disease. No other significant finding or nodule by ultrasound.  ASSESSMENT / PLAN / RECOMMENDATIONS:   1. Hyperthyroidism secondary to Graves' Disease  - Pt is clinically and biochemically euthyroid  - No local neck symptoms  - TSH upper limit of normal , and FT4 lower limit of normal, will decrease methimazole as below     Medications  Decrease Methimazole 10 mg, 1 tablet daily      2. Graves' disease:   - No extrathyroidal manifestations of Graves' disease - Up to date on eye exam (11/ 2021)  F/U in 4 months  Labs in 2 months    Signed electronically by: 2022, MD  [facility] Endocrinology  [facility] [address] [place] [place], [place] Phone: [phone] FAX: [phone]      CC: [phone], NP [address] [place] Phone: [phone]  Fax: [phone]   Return to Endocrinology clinic as below: No future appointments.

## 2020-07-04 ENCOUNTER — Other Ambulatory Visit: Payer: Self-pay

## 2020-07-04 ENCOUNTER — Ambulatory Visit (INDEPENDENT_AMBULATORY_CARE_PROVIDER_SITE_OTHER): Payer: 59 | Admitting: Internal Medicine

## 2020-07-04 ENCOUNTER — Encounter: Payer: Self-pay | Admitting: Internal Medicine

## 2020-07-04 VITALS — BP 126/82 | HR 70 | Ht 64.0 in | Wt 191.1 lb

## 2020-07-04 DIAGNOSIS — E0590 Thyrotoxicosis, unspecified without thyrotoxic crisis or storm: Secondary | ICD-10-CM | POA: Diagnosis not present

## 2020-07-04 DIAGNOSIS — E0500 Thyrotoxicosis with diffuse goiter without thyrotoxic crisis or storm: Secondary | ICD-10-CM

## 2020-07-04 HISTORY — DX: Thyrotoxicosis with diffuse goiter without thyrotoxic crisis or storm: E05.00

## 2020-07-04 LAB — T4, FREE: Free T4: 0.68 ng/dL (ref 0.60–1.60)

## 2020-07-04 LAB — TSH: TSH: 4.23 u[IU]/mL (ref 0.35–4.50)

## 2020-07-04 MED ORDER — METHIMAZOLE 10 MG PO TABS: 10.0000 mg | ORAL_TABLET | Freq: Every day | ORAL | 1 refills | Status: DC

## 2020-07-04 NOTE — Patient Instructions (Addendum)
We recommend that you follow these hyperthyroidism instructions at home:  1) Take Methimazole 10 mg, one a half tablets for now   If you develop severe sore throat with high fevers OR develop unexplained yellowing of your skin, eyes, under your tongue, severe abdominal pain with nausea or vomiting --> then please get evaluated immediately.  2) Get repeat thyroid labs 8 weeks.   It is ESSENTIAL to get follow-up labs to help avoid over or undertreatment of your hyperthyroidism - both of which can be dangerous to your health.

## 2020-08-27 ENCOUNTER — Other Ambulatory Visit: Payer: Self-pay | Admitting: Cardiology

## 2020-08-28 NOTE — Telephone Encounter (Signed)
Pharmacy notified patient need to discuss w/ PCP changing bupropion to continue Metoprolol. (Drug-Drug interacting) per Dr. Tomie China verbally.

## 2020-08-29 ENCOUNTER — Other Ambulatory Visit: Payer: Self-pay

## 2020-08-29 ENCOUNTER — Other Ambulatory Visit (INDEPENDENT_AMBULATORY_CARE_PROVIDER_SITE_OTHER): Payer: 59

## 2020-08-29 DIAGNOSIS — E0590 Thyrotoxicosis, unspecified without thyrotoxic crisis or storm: Secondary | ICD-10-CM

## 2020-08-29 LAB — T4, FREE: Free T4: 0.85 ng/dL (ref 0.60–1.60)

## 2020-08-29 LAB — TSH: TSH: 3.31 u[IU]/mL (ref 0.35–4.50)

## 2020-09-23 ENCOUNTER — Encounter: Payer: Self-pay | Admitting: Emergency Medicine

## 2020-09-23 ENCOUNTER — Ambulatory Visit
Admission: EM | Admit: 2020-09-23 | Discharge: 2020-09-23 | Disposition: A | Discharge status: Home / Self Care | Payer: 59 | Attending: Emergency Medicine | Admitting: Emergency Medicine

## 2020-09-23 ENCOUNTER — Other Ambulatory Visit: Payer: Self-pay

## 2020-09-23 DIAGNOSIS — S46812A Strain of other muscles, fascia and tendons at shoulder and upper arm level, left arm, initial encounter: Secondary | ICD-10-CM

## 2020-09-23 DIAGNOSIS — S161XXA Strain of muscle, fascia and tendon at neck level, initial encounter: Secondary | ICD-10-CM | POA: Diagnosis not present

## 2020-09-23 MED ORDER — IBUPROFEN 800 MG PO TABS: 800.0000 mg | ORAL_TABLET | Freq: Three times a day (TID) | ORAL | 0 refills | Status: DC

## 2020-09-23 MED ORDER — TIZANIDINE HCL 2 MG PO TABS: 2.0000 mg | ORAL_TABLET | Freq: Four times a day (QID) | ORAL | 0 refills | Status: DC | PRN

## 2020-09-23 MED ORDER — KETOROLAC TROMETHAMINE 30 MG/ML IJ SOLN
30.0000 mg | Freq: Once | INTRAMUSCULAR | Status: AC
  Administered 2020-09-23: 30 mg via INTRAMUSCULAR

## 2020-09-23 NOTE — ED Triage Notes (Signed)
Pt here for left sided neck and left shoulder x 2 days with hx of same; pt sts woke up with pain and denies new injury

## 2020-09-23 NOTE — Discharge Instructions (Addendum)
We gave you a shot of Toradol, that should begin working approximately 45 minutes  Use anti-inflammatories for pain/swelling. You may take up to 800 mg Ibuprofen every 8 hours with food. You may supplement Ibuprofen with Tylenol [phone] mg every 8 hours.   You may supplement tizanidine at home/bedtime, do not drive or work after taking, this may cause drowsiness  Gentle stretching, alternate ice and heat  Follow-up if not improving or worsening

## 2020-09-23 NOTE — ED Provider Notes (Signed)
[facility]    CSN: [id number] Arrival date & time: 09/23/20  0813      History   Chief Complaint Chief Complaint  Patient presents with  . Shoulder Pain    HPI Katelyn Soto is a 40 y.o. female presenting today for evaluation of left neck and shoulder pain.  Reports symptoms began 2 days ago upon waking up.  Denies any injury or trauma.  Denies any increase in activity or heavy lifting.  Took a leftover Norco at home.  Denies any associated radiation into left arm, numbness or tingling.  Denies dizziness lightheadedness or vision changes.  HPI  Past Medical History:  Diagnosis Date  . Anemia   . Cardiac murmur 02/10/2020  . Chest tightness 11/14/2017  . Cigarette smoker 11/14/2017  . History of COVID-19 02/10/2020  . Hyperthyroidism 03/10/2020  . Iron deficiency anemia 01/14/2016  . Palpitations 02/10/2020  . Shortness of breath 02/10/2020  . Tachycardia 02/10/2020  . Vitamin D deficiency 01/14/2016    Patient Active Problem List   Diagnosis Date Noted  . Graves disease 07/04/2020  . Hyperthyroidism 03/10/2020  . Tachycardia 02/10/2020  . History of COVID-19 02/10/2020  . Shortness of breath 02/10/2020  . Cardiac murmur 02/10/2020  . Palpitations 02/10/2020  . Anemia   . Chest tightness 11/14/2017  . Cigarette smoker 11/14/2017  . Iron deficiency anemia 01/14/2016  . Vitamin D deficiency 01/14/2016    Past Surgical History:  Procedure Laterality Date  . ESSURE TUBAL LIGATION      OB History   No obstetric history on file.      Home Medications    Prior to Admission medications   Medication Sig Start Date End Date Taking? Authorizing Provider  ibuprofen (ADVIL) 800 MG tablet Take 1 tablet (800 mg total) by mouth 3 (three) times daily. 09/23/20  Yes Avarie Tavano C, PA-C  tiZANidine (ZANAFLEX) 2 MG tablet Take 1-2 tablets (2-4 mg total) by mouth every 6 (six) hours as needed for muscle spasms. 09/23/20  Yes Evonna Stoltz C, PA-C  buPROPion  (WELLBUTRIN SR) 150 MG 12 hr tablet Take 1 tablet (150 mg total) by mouth daily. 06/07/20 06/07/21  Barbette Merino, NP  hydrOXYzine (VISTARIL) 25 MG capsule Take 1 capsule (25 mg total) by mouth 3 (three) times daily as needed. 02/09/20   Ivonne Andrew, NP  methimazole (TAPAZOLE) 10 MG tablet Take 1 tablet (10 mg total) by mouth daily. 07/04/20   Shamleffer, Konrad Dolores, MD  metoprolol succinate (TOPROL XL) 25 MG 24 hr tablet Take 1 tablet (25 mg total) by mouth daily. 02/10/20   Revankar, Aundra Dubin, MD  sertraline (ZOLOFT) 100 MG tablet Take 100 mg by mouth daily. 10/15/17 06/21/19  [provider]    Family History Family History  Problem Relation Age of Onset  . Hypertension Mother   . Diabetes Mother   . Heart disease Mother   . Heart failure Mother   . Cancer Father        throat    Social History Social History   Tobacco Use  . Smoking status: Never Smoker  . Smokeless tobacco: Never Used  Vaping Use  . Vaping Use: Never used  Substance Use Topics  . Alcohol use: Yes    Comment: socially  . Drug use: No     Allergies   Patient has no known allergies.   Review of Systems Review of Systems  Constitutional: Negative for fatigue and fever.  Eyes: Negative for  visual disturbance.  Respiratory: Negative for shortness of breath.   Cardiovascular: Negative for chest pain.  Gastrointestinal: Negative for abdominal pain, nausea and vomiting.  Musculoskeletal: Positive for back pain, myalgias and neck pain. Negative for arthralgias and joint swelling.  Skin: Negative for color change, rash and wound.  Neurological: Negative for dizziness, weakness, light-headedness and headaches.     Physical Exam Triage Vital Signs ED Triage Vitals  Enc Vitals Group     BP      Pulse      Resp      Temp      Temp src      SpO2      Weight      Height      Head Circumference      Peak Flow      Pain Score      Pain Loc      Pain Edu?      Excl. in GC?    No data  found.  Updated Vital Signs BP 122/76 (BP Location: Left Arm)   Pulse 72   Temp 98.1 F (36.7 C) (Oral)   Resp 18   SpO2 98%   Visual Acuity Right Eye Distance:   Left Eye Distance:   Bilateral Distance:    Right Eye Near:   Left Eye Near:    Bilateral Near:     Physical Exam Vitals and nursing note reviewed.  Constitutional:      Appearance: She is well-developed.     Comments: No acute distress  HENT:     Head: Normocephalic and atraumatic.     Nose: Nose normal.  Eyes:     Conjunctiva/sclera: Conjunctivae normal.  Cardiovascular:     Rate and Rhythm: Normal rate.  Pulmonary:     Effort: Pulmonary effort is normal. No respiratory distress.  Abdominal:     General: There is no distension.  Musculoskeletal:        General: Normal range of motion.     Cervical back: Neck supple.     Comments: Nontender to palpation along cervical, thoracic spine midline, diffuse tenderness throughout left cervical and superior trapezius musculature, nontender to palpation.  Proximal upper arm  Skin:    General: Skin is warm and dry.  Neurological:     Mental Status: She is alert and oriented to person, place, and time.      UC Treatments / Results  Labs (all labs ordered are listed, but only abnormal results are displayed) Labs Reviewed - No data to display  EKG   Radiology No results found.  Procedures Procedures (including critical care time)  Medications Ordered in UC Medications  ketorolac (TORADOL) 30 MG/ML injection 30 mg (has no administration in time range)    Initial Impression / Assessment and Plan / UC Course  I have reviewed the triage vital signs and the nursing notes.  Pertinent labs & imaging results that were available during my care of the patient were reviewed by me and considered in my medical decision making (see chart for details).     Treating for cervical/trapezius strain-providing Toradol prior to discharge and continuing on  anti-inflammatories and muscle relaxers and gentle stretching.  No neurodeficits.  Discussed strict return precautions. Patient verbalized understanding and is agreeable with plan.  Final Clinical Impressions(s) / UC Diagnoses   Final diagnoses:  Strain of left trapezius muscle, initial encounter  Strain of neck muscle, initial encounter     Discharge Instructions  We gave you a shot of Toradol, that should begin working approximately 45 minutes  Use anti-inflammatories for pain/swelling. You may take up to 800 mg Ibuprofen every 8 hours with food. You may supplement Ibuprofen with Tylenol [phone] mg every 8 hours.   You may supplement tizanidine at home/bedtime, do not drive or work after taking, this may cause drowsiness  Gentle stretching, alternate ice and heat  Follow-up if not improving or worsening    ED Prescriptions    Medication Sig Dispense Auth. Provider   ibuprofen (ADVIL) 800 MG tablet Take 1 tablet (800 mg total) by mouth 3 (three) times daily. 21 tablet Jorian Willhoite C, PA-C   tiZANidine (ZANAFLEX) 2 MG tablet Take 1-2 tablets (2-4 mg total) by mouth every 6 (six) hours as needed for muscle spasms. 30 tablet Io Dieujuste, [place] C, PA-C     PDMP not reviewed this encounter.   Lew Dawes, [place] 09/23/20 [phone]

## 2020-10-12 ENCOUNTER — Other Ambulatory Visit: Payer: Self-pay | Admitting: Cardiology

## 2020-10-13 NOTE — Telephone Encounter (Signed)
Refill sent to pharmacy.   

## 2020-11-07 ENCOUNTER — Other Ambulatory Visit: Payer: Self-pay

## 2020-11-07 ENCOUNTER — Encounter: Payer: Self-pay | Admitting: Internal Medicine

## 2020-11-07 ENCOUNTER — Ambulatory Visit: Payer: Medicaid Other | Admitting: Internal Medicine

## 2020-11-07 VITALS — BP 123/66 | HR 76 | Ht 64.0 in | Wt 195.0 lb

## 2020-11-07 DIAGNOSIS — E0590 Thyrotoxicosis, unspecified without thyrotoxic crisis or storm: Secondary | ICD-10-CM

## 2020-11-07 DIAGNOSIS — E0500 Thyrotoxicosis with diffuse goiter without thyrotoxic crisis or storm: Secondary | ICD-10-CM | POA: Diagnosis not present

## 2020-11-07 LAB — TSH: TSH: 3.98 u[IU]/mL (ref 0.35–4.50)

## 2020-11-07 LAB — T4, FREE: Free T4: 0.93 ng/dL (ref 0.60–1.60)

## 2020-11-07 MED ORDER — METHIMAZOLE 10 MG PO TABS: 10.0000 mg | ORAL_TABLET | Freq: Every day | ORAL | 1 refills | Status: DC

## 2020-11-07 NOTE — Progress Notes (Signed)
Name: MAKAYELA SECREST  MRN/ DOB: [id number], Jun 21, 1980    Age/ Sex: 40 y.o., female     PCP: Barbette Merino, NP   Reason for Endocrinology Evaluation: Hyperthyroidism     Initial Endocrinology Clinic Visit: 03/21/2020    PATIENT IDENTIFIER: Ms. KINLEE GARRISON is a 40 y.o., female with a past medical history of anemia and hyperthyroidism.  She has followed with [facility] since 03/21/2020 for consultative assistance with management of her hyperthyroidism  HISTORICAL SUMMARY:    She has been diagnosed with hyperthyroidism in 01/2020 with a suppressed TSH < 0.005 uIU/mL with an elevated T4 and FT3 at 7.77 ng/dL and 76.2 pg/mL respectively.    She had COVID infection 2 weeks prior to this. She was having fatigue, weight loss and palpitations, was having constipation and dysphagia.       Pt was started on Methimazole 03/14/2020 and Metoprolol.  TRAB elevated at 29.20 IU/L   No Fh of thyroid disease  SUBJECTIVE:    Today (11/07/2020):  Ms. Homesley is here for hyperthyroidism.   Weight stable  Denies fevever Denies constipation or diarrhea  Has occasional palpitations  LMP  last week     HOME ENDOCRINE MEDICATIONS: Methimazole 10 mg, 1 tab daily  Toprol XL 25 mg daily      HISTORY:  Past Medical History:  Past Medical History:  Diagnosis Date   Anemia    Cardiac murmur 02/10/2020   Chest tightness 11/14/2017   Cigarette smoker 11/14/2017   History of COVID-19 02/10/2020   Hyperthyroidism 03/10/2020   Iron deficiency anemia 01/14/2016   Palpitations 02/10/2020   Shortness of breath 02/10/2020   Tachycardia 02/10/2020   Vitamin D deficiency 01/14/2016   Past Surgical History:  Past Surgical History:  Procedure Laterality Date   ESSURE TUBAL LIGATION     Social History:  reports that she has never smoked. She has never used smokeless tobacco. She reports current alcohol use. She reports that she does not use drugs. Family History:  Family History  Problem  Relation Age of Onset   Hypertension Mother    Diabetes Mother    Heart disease Mother    Heart failure Mother    Cancer Father        throat     HOME MEDICATIONS: Allergies as of 11/07/2020   No Known Allergies      Medication List        Accurate as of November 07, 2020  9:12 AM. If you have any questions, ask your nurse or doctor.          buPROPion 150 MG 12 hr tablet Commonly known as: WELLBUTRIN SR Take 1 tablet (150 mg total) by mouth daily.   hydrOXYzine 25 MG capsule Commonly known as: Vistaril Take 1 capsule (25 mg total) by mouth 3 (three) times daily as needed.   ibuprofen 800 MG tablet Commonly known as: ADVIL Take 1 tablet (800 mg total) by mouth 3 (three) times daily.   methimazole 10 MG tablet Commonly known as: TAPAZOLE Take 1 tablet (10 mg total) by mouth daily.   metoprolol succinate 25 MG 24 hr tablet Commonly known as: TOPROL-XL TAKE 1 TABLET BY MOUTH EVERY DAY   tiZANidine 2 MG tablet Commonly known as: ZANAFLEX Take 1-2 tablets (2-4 mg total) by mouth every 6 (six) hours as needed for muscle spasms.          OBJECTIVE:   PHYSICAL EXAM: VS: BP 123/66   Pulse  76   Ht 5\' 4"  (1.626 m)   Wt 195 lb (88.5 kg)   SpO2 98%   BMI 33.47 kg/m    EXAM: General: Pt appears well and is in NAD  Neck: General: Supple without adenopathy. Thyroid: Thyroid gland is prominent.   No goiter or nodules appreciated. No thyroid bruit   Lungs: Clear with good BS bilat with no rales, rhonchi, or wheezes  Heart: Auscultation: RRR.  Abdomen: Normoactive bowel sounds, soft, nontender, without masses or organomegaly palpable  Extremities:  BL LE: No pretibial edema normal ROM and strength.  Mental Status: Judgment, insight: Intact Orientation: Oriented to time, place, and person Mood and affect: No depression, anxiety, or agitation     DATA REVIEWED:  Results for KEIARRA, CHARON (MRN Sonia Side) as of 11/07/2020 13:21  Ref. Range 11/07/2020 09:24  TSH  Latest Ref Range: 0.35 - 4.50 uIU/mL 3.98  T4,Free(Direct) Latest Ref Range: 0.60 - 1.60 ng/dL 11/09/2020    Results for LATRONDA, SPINK (MRN Sonia Side) as of 06/27/2020 07:41  Ref. Range 03/21/2020 08:41  TRAB Latest Ref Range: <=2.00 IU/L 29.20 (H)    Thyroid ultrasound 02/24/2020 Nonspecific thyroid heterogeneity compatible with medical thyroid disease. No other significant finding or nodule by ultrasound.  ASSESSMENT / PLAN / RECOMMENDATIONS:   Hyperthyroidism secondary to Graves' Disease  - Pt is clinically and biochemically euthyroid  - No local neck symptoms  - TFT's are stable and normal , no changes    Medications  Continue Methimazole 10 mg, 1 tablet daily      2. Graves' disease:   - No extrathyroidal manifestations of Graves' disease - Up to date on eye exam (11/ 2021)    F/U in 4 months  Labs in 2 months    Signed electronically by: 2022, MD  [facility]  [facility] [address] [place] [place], [place] Phone: [phone] FAX: [phone]      CC: [phone], NP [address] [place] Phone: [phone]  Fax: [phone]   Return to Endocrinology clinic as below: Future Appointments  Date Time Provider Department Center  11/15/2020  9:20 AM Revankar, 11/17/2020, MD [facility] None

## 2020-11-07 NOTE — Patient Instructions (Signed)
We recommend that you follow these hyperthyroidism instructions at home:  1) Take Methimazole 10 mg, one tablet for now  If you develop severe sore throat with high fevers OR develop unexplained yellowing of your skin, eyes, under your tongue, severe abdominal pain with nausea or vomiting --> then please get evaluated immediately.  2) Get repeat thyroid labs 8 weeks.  It is ESSENTIAL to get follow-up labs to help avoid over or undertreatment of your hyperthyroidism - both of which can be dangerous to your health.

## 2020-11-15 ENCOUNTER — Ambulatory Visit (INDEPENDENT_AMBULATORY_CARE_PROVIDER_SITE_OTHER): Payer: 59 | Admitting: Cardiology

## 2020-11-15 ENCOUNTER — Encounter: Payer: Self-pay | Admitting: Cardiology

## 2020-11-15 ENCOUNTER — Other Ambulatory Visit: Payer: Self-pay

## 2020-11-15 VITALS — BP 130/78 | HR 72 | Ht 64.0 in | Wt 197.1 lb

## 2020-11-15 DIAGNOSIS — F17210 Nicotine dependence, cigarettes, uncomplicated: Secondary | ICD-10-CM | POA: Diagnosis not present

## 2020-11-15 DIAGNOSIS — E0590 Thyrotoxicosis, unspecified without thyrotoxic crisis or storm: Secondary | ICD-10-CM | POA: Diagnosis not present

## 2020-11-15 DIAGNOSIS — Z13220 Encounter for screening for lipoid disorders: Secondary | ICD-10-CM

## 2020-11-15 DIAGNOSIS — R0789 Other chest pain: Secondary | ICD-10-CM

## 2020-11-15 DIAGNOSIS — R002 Palpitations: Secondary | ICD-10-CM

## 2020-11-15 DIAGNOSIS — E669 Obesity, unspecified: Secondary | ICD-10-CM | POA: Insufficient documentation

## 2020-11-15 HISTORY — DX: Obesity, unspecified: E66.9

## 2020-11-15 NOTE — Progress Notes (Signed)
Cardiology Office Note:    Date:  11/15/2020   ID:  Katelyn Soto, DOB 01/29/1981, MRN [id number]  PCP:  Barbette Merino, NP  Cardiologist:  Garwin Brothers, MD   Referring MD: Barbette Merino, NP    ASSESSMENT:    1. Chest tightness   2. Cigarette smoker   3. Palpitations   4. Obesity (BMI 30.0-34.9)   5. Hyperthyroidism   6. Screening cholesterol level    PLAN:    In order of problems listed above:  Primary prevention stressed with the patient.  Importance of compliance with diet medication stressed and she vocalized understanding.  She was advised to walk at least half an hour a day 5 days a week and she promises to do so. Palpitations: These have resolved for the most part.  She occasionally has palpitations but they do not bother her much.  She is on beta-blockers. Obesity: Weight reduction stressed risks of obesity explained lifestyle modification urged and she promises to do better.  Diet was emphasized. Thyroid disease: Managed by her endocrinologist who is monitoring her thyroid function regularly. Mild dyslipidemia: I discussed this with the patient at length and diet was emphasized.  She is fasting and will have a Chem-7 liver lipid and TSH today. Patient will be seen in follow-up appointment in 6 months or earlier if the patient has any concerns    Medication Adjustments/Labs and Tests Ordered: Current medicines are reviewed at length with the patient today.  Concerns regarding medicines are outlined above.  Orders Placed This Encounter  Procedures   Basic metabolic panel   Hepatic function panel   Lipid panel   TSH   No orders of the defined types were placed in this encounter.    No chief complaint on file.    History of Present Illness:    Katelyn Soto is a 40 y.o. female.  Patient has past medical history of palpitations.  She denies any problems at this time and takes care of activities of daily living.  She occasionally still has palpitations but  that does not bother her much.  Her thyroid issues have been addressed.  She has had COVID infection in the past and has recovered completely.  She is overweight and leads a sedentary lifestyle.  At the time of my evaluation, the patient is alert awake oriented and in no distress.  Past Medical History:  Diagnosis Date   Anemia    Cardiac murmur 02/10/2020   Chest tightness 11/14/2017   Cigarette smoker 11/14/2017   Graves disease 07/04/2020   History of COVID-19 02/10/2020   Hyperthyroidism 03/10/2020   Iron deficiency anemia 01/14/2016   Palpitations 02/10/2020   Shortness of breath 02/10/2020   Tachycardia 02/10/2020   Vitamin D deficiency 01/14/2016    Past Surgical History:  Procedure Laterality Date   ESSURE TUBAL LIGATION      Current Medications: Current Meds  Medication Sig   hydrOXYzine (ATARAX/VISTARIL) 25 MG tablet Take 25 mg by mouth 3 (three) times daily as needed for itching.   methimazole (TAPAZOLE) 10 MG tablet Take 1 tablet (10 mg total) by mouth daily.   metoprolol succinate (TOPROL-XL) 25 MG 24 hr tablet Take 25 mg by mouth daily.     Allergies:   Patient has no known allergies.   Social History   Socioeconomic History   Marital status: Legally Separated    Spouse name: Not on file   Number of children: Not on file   Years  of education: Not on file   Highest education level: Not on file  Occupational History   Not on file  Tobacco Use   Smoking status: Never   Smokeless tobacco: Never  Vaping Use   Vaping Use: Never used  Substance and Sexual Activity   Alcohol use: Yes    Comment: socially   Drug use: No   Sexual activity: Not on file  Other Topics Concern   Not on file  Social History Narrative   Not on file   Social Determinants of Health   Financial Resource Strain: Not on file  Food Insecurity: Not on file  Transportation Needs: Not on file  Physical Activity: Not on file  Stress: Not on file  Social Connections: Not on file     Family  History: The patient's family history includes Cancer in her father; Diabetes in her mother; Heart disease in her mother; Heart failure in her mother; Hypertension in her mother.  ROS:   Please see the history of present illness.    All other systems reviewed and are negative.  EKGs/Labs/Other Studies Reviewed:    The following studies were reviewed today: I discussed my findings with the patient at extensive length   Recent Labs: 02/09/2020: ALT 42 02/22/2020: BUN 9; Creatinine, Ser 0.46; Potassium 4.0; Sodium 137 03/21/2020: Hemoglobin 12.7; Platelets 218 11/07/2020: TSH 3.98  Recent Lipid Panel    Component Value Date/Time   CHOL 166 11/14/2017 1015   TRIG 55 11/14/2017 1015   HDL 40 11/14/2017 1015   CHOLHDL 4.2 11/14/2017 1015   LDLCALC 115 (H) 11/14/2017 1015    Physical Exam:    VS:  BP 130/78   Pulse 72   Ht 5\' 4"  (1.626 m)   Wt 197 lb 1.3 oz (89.4 kg)   SpO2 99%   BMI 33.83 kg/m     Wt Readings from Last 3 Encounters:  11/15/20 197 lb 1.3 oz (89.4 kg)  11/07/20 195 lb (88.5 kg)  07/04/20 191 lb 2 oz (86.7 kg)     GEN: Patient is in no acute distress HEENT: Normal NECK: No JVD; No carotid bruits LYMPHATICS: No lymphadenopathy CARDIAC: Hear sounds regular, 2/6 systolic murmur at the apex. RESPIRATORY:  Clear to auscultation without rales, wheezing or rhonchi  ABDOMEN: Soft, non-tender, non-distended MUSCULOSKELETAL:  No edema; No deformity  SKIN: Warm and dry NEUROLOGIC:  Alert and oriented x 3 PSYCHIATRIC:  Normal affect   Signed, 09/01/20, MD  11/15/2020 10:25 AM    [facility]

## 2020-11-15 NOTE — Patient Instructions (Signed)
Medication Instructions:  No medication changes. *If you need a refill on your cardiac medications before your next appointment, please call your pharmacy*   Lab Work: Your physician recommends that you have labs done in the office today. Your test included  basic metabolic panel, TSH, liver function and lipids.  If you have labs (blood work) drawn today and your tests are completely normal, you will receive your results only by: MyChart Message (if you have MyChart) OR A paper copy in the mail If you have any lab test that is abnormal or we need to change your treatment, we will call you to review the results.   Testing/Procedures: None ordered   Follow-Up: At [facility], you and your health needs are our priority.  As part of our continuing mission to provide you with exceptional heart care, we have created designated Provider Care Teams.  These Care Teams include your primary Cardiologist (physician) and Advanced Practice Providers (APPs -  Physician Assistants and Nurse Practitioners) who all work together to provide you with the care you need, when you need it.  We recommend signing up for the patient portal called "MyChart".  Sign up information is provided on this After Visit Summary.  MyChart is used to connect with patients for Virtual Visits (Telemedicine).  Patients are able to view lab/test results, encounter notes, upcoming appointments, etc.  Non-urgent messages can be sent to your provider as well.   To learn more about what you can do with MyChart, go to [email or web address].com.    Your next appointment:   6 month(s)  The format for your next appointment:   In Person  Provider:   Rajan Revankar, MD   Other Instructions NA  

## 2020-11-16 LAB — LIPID PANEL
Chol/HDL Ratio: 3.3 ratio (ref 0.0–4.4)
Cholesterol, Total: 142 mg/dL (ref 100–199)
HDL: 43 mg/dL (ref >39)
LDL Chol Calc (NIH): 85 mg/dL (ref 0–99)
Triglycerides: 71 mg/dL (ref 0–149)
VLDL Cholesterol Cal: 14 mg/dL (ref 5–40)

## 2020-11-16 LAB — TSH: TSH: 2.74 u[IU]/mL (ref 0.450–4.500)

## 2020-11-16 LAB — BASIC METABOLIC PANEL
BUN/Creatinine Ratio: 12 (ref 9–23)
BUN: 11 mg/dL (ref 6–24)
CO2: 21 mmol/L (ref 20–29)
Calcium: 9 mg/dL (ref 8.7–10.2)
Chloride: 105 mmol/L (ref 96–106)
Creatinine, Ser: 0.9 mg/dL (ref 0.57–1.00)
Glucose: 98 mg/dL (ref 65–99)
Potassium: 4.4 mmol/L (ref 3.5–5.2)
Sodium: 139 mmol/L (ref 134–144)
eGFR: 82 mL/min/{1.73_m2} (ref >59)

## 2020-11-16 LAB — HEPATIC FUNCTION PANEL
ALT: 9 IU/L (ref 0–32)
AST: 10 IU/L (ref 0–40)
Albumin: 4 g/dL (ref 3.8–4.8)
Alkaline Phosphatase: 56 IU/L (ref 44–121)
Bilirubin Total: <0.2 mg/dL (ref 0.0–1.2)
Bilirubin, Direct: <0.1 mg/dL (ref 0.00–0.40)
Total Protein: 6.5 g/dL (ref 6.0–8.5)

## 2020-12-12 ENCOUNTER — Ambulatory Visit
Admission: EM | Admit: 2020-12-12 | Discharge: 2020-12-12 | Disposition: A | Discharge status: Home / Self Care | Payer: 59 | Attending: Emergency Medicine | Admitting: Emergency Medicine

## 2020-12-12 ENCOUNTER — Other Ambulatory Visit: Payer: Self-pay

## 2020-12-12 ENCOUNTER — Telehealth: Payer: Self-pay | Admitting: Emergency Medicine

## 2020-12-12 DIAGNOSIS — J020 Streptococcal pharyngitis: Secondary | ICD-10-CM

## 2020-12-12 DIAGNOSIS — Z20822 Contact with and (suspected) exposure to covid-19: Secondary | ICD-10-CM

## 2020-12-12 LAB — POCT RAPID STREP A (OFFICE): Rapid Strep A Screen: POSITIVE — AB

## 2020-12-12 MED ORDER — AMOXICILLIN 500 MG PO CAPS
500.0000 mg | ORAL_CAPSULE | Freq: Two times a day (BID) | ORAL | 0 refills | Status: DC
  Filled 2020-12-12: qty 20, 10d supply, fill #0

## 2020-12-12 MED ORDER — HYDROXYZINE HCL 25 MG PO TABS: 25.0000 mg | ORAL_TABLET | Freq: Three times a day (TID) | ORAL | 0 refills | Status: DC | PRN

## 2020-12-12 MED ORDER — IBUPROFEN 600 MG PO TABS
600.0000 mg | ORAL_TABLET | Freq: Four times a day (QID) | ORAL | 0 refills | Status: DC | PRN
  Filled 2020-12-12: qty 30, 8d supply, fill #0

## 2020-12-12 MED ORDER — FLUTICASONE PROPIONATE 50 MCG/ACT NA SUSP
2.0000 | Freq: Every day | NASAL | 0 refills | Status: DC
  Filled 2020-12-12: qty 16, 30d supply, fill #0

## 2020-12-12 MED ORDER — AMOXICILLIN 500 MG PO CAPS: 500.0000 mg | ORAL_CAPSULE | Freq: Two times a day (BID) | ORAL | 0 refills | Status: AC

## 2020-12-12 MED ORDER — IBUPROFEN 600 MG PO TABS: 600.0000 mg | ORAL_TABLET | Freq: Four times a day (QID) | ORAL | 0 refills | Status: DC | PRN

## 2020-12-12 NOTE — ED Provider Notes (Signed)
HPI  SUBJECTIVE:  Patient reports sore throat starting 2 days ago.  Sx worse with swallowing.  Sx better with being in the heat.  Has been taking 650 mg of Tylenol every 8 hours and Alka-Seltzer. + Fever tmax 100.6  No neck stiffness  No Cough No nasal congestion, rhinorrhea + Myalgias No Headache No Rash  No loss of taste or smell No shortness of breath or difficulty breathing No nausea, vomiting No diarrhea No abdominal pain     No Recent Strep, mono, COVID exposure No reflux sxs No Allergy sxs  No Breathing difficulty, voice changes, sensation of throat swelling shut No Drooling No Trismus No abx in past month.  She got the second dose of the COVID-vaccine  + antipyretic in past 4-6 hrs-Tylenol Past medical history of murmur, COVID x2, last episode January 22, Graves' disease, palpitations, smoking.   Past Medical History:  Diagnosis Date   Anemia    Cardiac murmur 02/10/2020   Chest tightness 11/14/2017   Cigarette smoker 11/14/2017   Graves disease 07/04/2020   History of COVID-19 02/10/2020   Hyperthyroidism 03/10/2020   Iron deficiency anemia 01/14/2016   Palpitations 02/10/2020   Shortness of breath 02/10/2020   Tachycardia 02/10/2020   Vitamin D deficiency 01/14/2016    Past Surgical History:  Procedure Laterality Date   ESSURE TUBAL LIGATION      Family History  Problem Relation Age of Onset   Hypertension Mother    Diabetes Mother    Heart disease Mother    Heart failure Mother    Cancer Father        throat    Social History   Tobacco Use   Smoking status: Never   Smokeless tobacco: Never  Vaping Use   Vaping Use: Never used  Substance Use Topics   Alcohol use: Yes    Comment: socially   Drug use: No    No current facility-administered medications for this encounter.  Current Outpatient Medications:    amoxicillin (AMOXIL) 500 MG tablet, Take 1 tablet (500 mg total) by mouth 2 (two) times daily for 10 days., Disp: 20 tablet, Rfl: 0    fluticasone (FLONASE) 50 MCG/ACT nasal spray, Place 2 sprays into both nostrils daily., Disp: 16 g, Rfl: 0   ibuprofen (ADVIL) 600 MG tablet, Take 1 tablet (600 mg total) by mouth every 6 (six) hours as needed., Disp: 30 tablet, Rfl: 0   hydrOXYzine (ATARAX/VISTARIL) 25 MG tablet, Take 25 mg by mouth 3 (three) times daily as needed for itching., Disp: , Rfl:    methimazole (TAPAZOLE) 10 MG tablet, Take 1 tablet (10 mg total) by mouth daily., Disp: 90 tablet, Rfl: 1   metoprolol succinate (TOPROL-XL) 25 MG 24 hr tablet, Take 25 mg by mouth daily., Disp: , Rfl:   No Known Allergies   ROS  As noted in HPI.   Physical Exam  BP 118/80 (BP Location: Left Arm)   Pulse 82   Temp 98.4 F (36.9 C) (Oral)   Resp 18   SpO2 97%   Constitutional: Well developed, well nourished, no acute distress Eyes:  EOMI, conjunctiva normal bilaterally HENT: Normocephalic, atraumatic,mucus membranes moist. + nasal congestion, erythematous, swollen turbinates.  No maxillary, frontal sinus tenderness.  Positive erythematous oropharynx with enlarged tonsils.  Positive exudates.  Uvula midline.  Respiratory: Normal inspiratory effort Cardiovascular: Normal rate, no murmurs, rubs, gallops GI: nondistended, nontender. No appreciable splenomegaly skin: No rash, skin intact Lymph: Positive anterior cervical LN.  No posterior cervical  lymphadenopathy Musculoskeletal: no deformities Neurologic: Alert & oriented x 3, no focal neuro deficits Psychiatric: Speech and behavior appropriate.   ED Course   Medications - No data to display  Orders Placed This Encounter  Procedures   POCT rapid strep A    Standing Status:   Standing    Number of Occurrences:   1    Results for orders placed or performed during the hospital encounter of 12/12/20 (from the past 24 hour(s))  POCT rapid strep A     Status: Abnormal   Collection Time: 12/12/20  3:13 PM  Result Value Ref Range   Rapid Strep A Screen Positive (A)  Negative   No results found. Results for orders placed or performed during the hospital encounter of 12/12/20  Covid-19, Flu A+B ([facility])   Specimen: Nasopharyngeal   Naso  Release to patie  Result Value Ref Range   SARS-CoV-2, NAA Not Detected Not Detected   Influenza A, NAA Not Detected Not Detected   Influenza B, NAA Not Detected Not Detected   Test Information: Comment   POCT rapid strep A  Result Value Ref Range   Rapid Strep A Screen Positive (A) Negative     ED Clinical Impression  1. Strep pharyngitis   2. Encounter for laboratory testing for COVID-19 virus     ED Assessment/Plan  Rapid strep positive. Sending home with penicillin, Keflex for 10 days azithromycin for 5 days.. Home with ibuprofen, Tylenol, Benadryl/Maalox mixture. Patient to followup with PMD when necessary, will refer to local primary care resources.  COVID, flu still sent.  Patient will be a candidate for Tamiflu or COVID antiviral.  She has opted for Molnupiravir.  COVID flu negative.  Home with amoxicillin, saline nasal irrigation, Mucinex, Flonase, Benadryl/Maalox mixture, Tylenol/ibuprofen.  Follow-up with PMD as needed.  ER return precautions given.  Discussed labs,  MDM, plan and followup with patient. Discussed sn/sx that should prompt return to the ED. patient agrees with plan.   Meds ordered this encounter  Medications   amoxicillin (AMOXIL) 500 MG tablet    Sig: Take 1 tablet (500 mg total) by mouth 2 (two) times daily for 10 days.    Dispense:  20 tablet    Refill:  0   ibuprofen (ADVIL) 600 MG tablet    Sig: Take 1 tablet (600 mg total) by mouth every 6 (six) hours as needed.    Dispense:  30 tablet    Refill:  0   fluticasone (FLONASE) 50 MCG/ACT nasal spray    Sig: Place 2 sprays into both nostrils daily.    Dispense:  16 g    Refill:  0     *This clinic note was created using Scientist, clinical (histocompatibility and immunogenetics). Therefore, there may be occasional mistakes despite careful  proofreading.     Domenick Gong, MD 12/14/20 1026

## 2020-12-12 NOTE — ED Triage Notes (Signed)
Two day h/o fever and sore throat. Tmax 100.6. Confirms dysphagia and decreased appetite. Denies abdominal pain, n/v/d/r. No cough and congestion. No meds taken.

## 2020-12-12 NOTE — Discharge Instructions (Addendum)
Rapid strep was positive today, finish the amoxicillin even if you feel better.  COVID, flu will be back in a day or 2.  Saline nasal irrigation with a Lloyd Huger Med rinse, Mucinex and Flonase for the nasal congestion and to prevent a secondary sinus infection.  1 gram of Tylenol and 600 mg ibuprofen together 3-4 times a day as needed for pain.  Make sure you drink plenty of extra fluids.  Some people find salt water gargles and  Traditional Medicinal's "Throat Coat" tea helpful. Take 5 mL of liquid Benadryl and 5 mL of Maalox. Mix it together, and then hold it in your mouth for as long as you can and then swallow. You may do this 4 times a day.    Go to www.goodrx.com  or www.costplusdrugs.com to look up your medications. This will give you a list of where you can find your prescriptions at the most affordable prices. Or ask the pharmacist what the cash price is, or if they have any other discount programs available to help make your medication more affordable. This can be less expensive than what you would pay with insurance.

## 2020-12-13 LAB — COVID-19, FLU A+B NAA
Influenza A, NAA: NOT DETECTED
Influenza B, NAA: NOT DETECTED
SARS-CoV-2, NAA: NOT DETECTED

## 2020-12-19 ENCOUNTER — Other Ambulatory Visit: Payer: Self-pay

## 2021-01-02 ENCOUNTER — Other Ambulatory Visit (INDEPENDENT_AMBULATORY_CARE_PROVIDER_SITE_OTHER): Payer: 59

## 2021-01-02 ENCOUNTER — Other Ambulatory Visit: Payer: Self-pay

## 2021-01-02 ENCOUNTER — Telehealth: Payer: Self-pay | Admitting: *Deleted

## 2021-01-02 DIAGNOSIS — E0590 Thyrotoxicosis, unspecified without thyrotoxic crisis or storm: Secondary | ICD-10-CM

## 2021-01-02 LAB — T4, FREE: Free T4: 0.94 ng/dL (ref 0.60–1.60)

## 2021-01-02 LAB — TSH: TSH: 3.02 u[IU]/mL (ref 0.35–5.50)

## 2021-01-02 NOTE — Telephone Encounter (Signed)
Who Is Calling Patient / Member / Family / Caregiver Caller Name [name] Phone Number [phone] Patient Name Katelyn Soto Patient DOB 1981-02-15 Call Type Message Only Information Provided Reason for Call Request to Reschedule Office Appointment Initial Comment Caller states has lab appt at 830; has court at 9am and needs to reschedule. Patient request to speak to RN No Additional Comment Adv to call back after 8am; Disp. Time Disposition Final User 01/02/2021 7:07:57 AM General Information Provided Yes Albin Fischer

## 2021-01-02 NOTE — Telephone Encounter (Signed)
Tried calling patient to reschedule but no answer/ no vm.  Appointment cancelled.

## 2021-03-09 ENCOUNTER — Encounter (HOSPITAL_COMMUNITY): Payer: Self-pay | Admitting: Emergency Medicine

## 2021-03-09 ENCOUNTER — Other Ambulatory Visit: Payer: Self-pay

## 2021-03-09 ENCOUNTER — Ambulatory Visit (HOSPITAL_COMMUNITY)
Admission: EM | Admit: 2021-03-09 | Discharge: 2021-03-09 | Disposition: A | Discharge status: Home / Self Care | Payer: 59 | Attending: Emergency Medicine | Admitting: Emergency Medicine

## 2021-03-09 DIAGNOSIS — N939 Abnormal uterine and vaginal bleeding, unspecified: Secondary | ICD-10-CM | POA: Diagnosis not present

## 2021-03-09 DIAGNOSIS — M779 Enthesopathy, unspecified: Secondary | ICD-10-CM

## 2021-03-09 DIAGNOSIS — M25531 Pain in right wrist: Secondary | ICD-10-CM | POA: Diagnosis not present

## 2021-03-09 MED ORDER — PREDNISONE 10 MG (21) PO TBPK: ORAL_TABLET | Freq: Every day | ORAL | 0 refills | Status: DC

## 2021-03-09 MED ORDER — PROGESTERONE MICRONIZED 100 MG PO CAPS: 100.0000 mg | ORAL_CAPSULE | Freq: Every day | ORAL | 1 refills | Status: DC

## 2021-03-09 NOTE — ED Notes (Signed)
Pt reports that she has an expired IUD but due to loss of insurance unable to afford to get it out.

## 2021-03-09 NOTE — Discharge Instructions (Addendum)
You will need to call your obgyn about the vaginal bleeding and have your BC changed.  Your wrist appears as tendonitis discussed to wear a splint or ace wrap to the wrist as needed for pain . Follow up with hand specialist for further test  Can take motrin as needed for pain and swelling

## 2021-03-09 NOTE — ED Provider Notes (Signed)
[facility]    CSN: [id number] Arrival date & time: 03/09/21  1410      History   Chief Complaint Chief Complaint  Patient presents with   Wrist Pain   Vaginal Bleeding    HPI Katelyn Soto is a 40 y.o. female.   Pt here for irregular bleeding for 3 weeks since her nexplon [facility] is time to remove. Pt can not get it removed till [place] due to insurance. Pt has a hx of irregular vaginal bleeding. Denies any urinary sx, no n/v/d. Has been taking motrin for cramping. Has called her obgyn to inform them.   RT wrist pain worse in the am and when she has to open a door knob. Has slight swelling to wrist area. Denies any injury. Pain is intermit depending if it has swelling or not. Has not taken anything for this. Denies any neck pain.    Past Medical History:  Diagnosis Date   Anemia    Cardiac murmur 02/10/2020   Chest tightness 11/14/2017   Cigarette smoker 11/14/2017   Graves disease 07/04/2020   History of COVID-19 02/10/2020   Hyperthyroidism 03/10/2020   Iron deficiency anemia 01/14/2016   Palpitations 02/10/2020   Shortness of breath 02/10/2020   Tachycardia 02/10/2020   Vitamin D deficiency 01/14/2016    Patient Active Problem List   Diagnosis Date Noted   Obesity (BMI 30.0-34.9) 11/15/2020   Graves disease 07/04/2020   Hyperthyroidism 03/10/2020   Tachycardia 02/10/2020   History of COVID-19 02/10/2020   Shortness of breath 02/10/2020   Cardiac murmur 02/10/2020   Palpitations 02/10/2020   Anemia    Chest tightness 11/14/2017   Cigarette smoker 11/14/2017   Iron deficiency anemia 01/14/2016   Vitamin D deficiency 01/14/2016    Past Surgical History:  Procedure Laterality Date   ESSURE TUBAL LIGATION      OB History   No obstetric history on file.      Home Medications    Prior to Admission medications   Medication Sig Start Date End Date Taking? Authorizing Provider  predniSONE (STERAPRED UNI-PAK 21 TAB) 10 MG (21) TBPK tablet Take by mouth daily.  Take 6 tabs by mouth daily  for 2 days, then 5 tabs for 2 days, then 4 tabs for 2 days, then 3 tabs for 2 days, 2 tabs for 2 days, then 1 tab by mouth daily for 2 days 03/09/21  Yes Coralyn Mark, NP  progesterone (PROMETRIUM) 100 MG capsule Take 1 capsule (100 mg total) by mouth daily. 03/09/21  Yes Coralyn Mark, NP  fluticasone (FLONASE) 50 MCG/ACT nasal spray Place 2 sprays into both nostrils daily. 12/12/20   Domenick Gong, MD  hydrOXYzine (ATARAX/VISTARIL) 25 MG tablet Take 1 tablet (25 mg total) by mouth 3 (three) times daily as needed for itching. 12/12/20   Domenick Gong, MD  ibuprofen (ADVIL) 600 MG tablet Take 1 tablet (600 mg total) by mouth every 6 (six) hours as needed. 12/12/20   Domenick Gong, MD  methimazole (TAPAZOLE) 10 MG tablet Take 1 tablet (10 mg total) by mouth daily. 11/07/20   Shamleffer, Konrad Dolores, MD  metoprolol succinate (TOPROL-XL) 25 MG 24 hr tablet Take 25 mg by mouth daily.    [provider]  sertraline (ZOLOFT) 100 MG tablet Take 100 mg by mouth daily. 10/15/17 06/21/19  [provider]    Family History Family History  Problem Relation Age of Onset   Hypertension Mother    Diabetes Mother  Heart disease Mother    Heart failure Mother    Cancer Father        throat    Social History Social History   Tobacco Use   Smoking status: Never   Smokeless tobacco: Never  Vaping Use   Vaping Use: Never used  Substance Use Topics   Alcohol use: Yes    Comment: socially   Drug use: No     Allergies   Patient has no known allergies.   Review of Systems Review of Systems  Constitutional:  Negative for fever.  Respiratory: Negative.    Cardiovascular: Negative.   Gastrointestinal:  Negative for abdominal pain, diarrhea, nausea and vomiting.  Genitourinary:  Positive for menstrual problem and vaginal bleeding. Negative for difficulty urinating, flank pain, frequency and urgency.  Neurological: Negative.      Physical Exam Triage Vital Signs ED Triage Vitals  Enc Vitals Group     BP 03/09/21 1508 132/84     Pulse Rate 03/09/21 1508 66     Resp 03/09/21 1508 17     Temp 03/09/21 1508 98.6 F (37 C)     Temp Source 03/09/21 1508 Oral     SpO2 03/09/21 1508 99 %     Weight --      Height --      Head Circumference --      Peak Flow --      Pain Score 03/09/21 1505 7     Pain Loc --      Pain Edu? --      Excl. in GC? --    No data found.  Updated Vital Signs BP 132/84 (BP Location: Right Arm)   Pulse 66   Temp 98.6 F (37 C) (Oral)   Resp 17   LMP 02/15/2021   SpO2 99%   Visual Acuity Right Eye Distance:   Left Eye Distance:   Bilateral Distance:    Right Eye Near:   Left Eye Near:    Bilateral Near:     Physical Exam Constitutional:      Appearance: Normal appearance.  Cardiovascular:     Rate and Rhythm: Normal rate.     Pulses: Normal pulses.  Pulmonary:     Effort: Pulmonary effort is normal.  Abdominal:     General: Abdomen is flat.  Musculoskeletal:        General: Tenderness present.     Comments: Slight edema noted to rt wrist area, full ROM tenderness with flexion. Warm , strong pulses.   Skin:    General: Skin is warm.     Capillary Refill: Capillary refill takes less than 2 seconds.     Findings: No bruising or erythema.  Neurological:     General: No focal deficit present.     Mental Status: She is alert.     UC Treatments / Results  Labs (all labs ordered are listed, but only abnormal results are displayed) Labs Reviewed - No data to display  EKG   Radiology No results found.  Procedures Procedures (including critical care time)  Medications Ordered in UC Medications - No data to display  Initial Impression / Assessment and Plan / UC Course  I have reviewed the triage vital signs and the nursing notes.  Pertinent labs & imaging results that were available during my care of the patient were reviewed by me and considered in  my medical decision making (see chart for details).     You will need to call  your obgyn about the vaginal bleeding and have your BC changed. If symptoms become worse go to er you  Your wrist appears as tendonitis discussed to wear a splint or ace wrap to the wrist as needed for pain . Follow up with hand specialist for further test  Can take motrin as needed for pain and swelling  Final Clinical Impressions(s) / UC Diagnoses   Final diagnoses:  Abnormal vaginal bleeding  Right wrist pain  Tendonitis     Discharge Instructions      You will need to call your obgyn about the vaginal bleeding and have your BC changed.  Your wrist appears as tendonitis discussed to wear a splint or ace wrap to the wrist as needed for pain . Follow up with hand specialist for further test  Can take motrin as needed for pain and swelling       ED Prescriptions     Medication Sig Dispense Auth. Provider   progesterone (PROMETRIUM) 100 MG capsule Take 1 capsule (100 mg total) by mouth daily. 20 capsule Maple Mirza L, NP   predniSONE (STERAPRED UNI-PAK 21 TAB) 10 MG (21) TBPK tablet Take by mouth daily. Take 6 tabs by mouth daily  for 2 days, then 5 tabs for 2 days, then 4 tabs for 2 days, then 3 tabs for 2 days, 2 tabs for 2 days, then 1 tab by mouth daily for 2 days 42 tablet Coralyn Mark, NP      PDMP not reviewed this encounter.   Coralyn Mark, NP 03/09/21 1551

## 2021-03-09 NOTE — ED Triage Notes (Signed)
Pt c/o vaginal bleeding for 3 weeks.  Right wrist pains for a couple weeks. Denies injuries or traumas,

## 2021-03-13 ENCOUNTER — Ambulatory Visit (INDEPENDENT_AMBULATORY_CARE_PROVIDER_SITE_OTHER): Payer: 59 | Admitting: Internal Medicine

## 2021-03-13 ENCOUNTER — Other Ambulatory Visit: Payer: Self-pay

## 2021-03-13 VITALS — BP 130/82 | HR 60 | Ht 64.0 in | Wt 189.0 lb

## 2021-03-13 DIAGNOSIS — E0590 Thyrotoxicosis, unspecified without thyrotoxic crisis or storm: Secondary | ICD-10-CM | POA: Diagnosis not present

## 2021-03-13 DIAGNOSIS — E0500 Thyrotoxicosis with diffuse goiter without thyrotoxic crisis or storm: Secondary | ICD-10-CM

## 2021-03-13 LAB — TSH: TSH: 0.33 u[IU]/mL — ABNORMAL LOW (ref 0.35–5.50)

## 2021-03-13 LAB — T4, FREE: Free T4: 1.09 ng/dL (ref 0.60–1.60)

## 2021-03-13 NOTE — Progress Notes (Signed)
Name: Katelyn Soto  MRN/ DOB: [id number], [birth year]    Age/ Sex: 40 y.o., female     PCP: Barbette Merino, NP   Reason for Endocrinology Evaluation: Hyperthyroidism     Initial Endocrinology Clinic Visit: 03/21/2020    PATIENT IDENTIFIER: Katelyn Soto is a 40 y.o., female with a past medical history of anemia and hyperthyroidism.  She has followed with [facility] since 03/21/2020 for consultative assistance with management of her hyperthyroidism  HISTORICAL SUMMARY:    She has been diagnosed with hyperthyroidism in 01/2020 with a suppressed TSH < 0.005 uIU/mL with an elevated T4 and FT3 at 7.77 ng/dL and 56.3 pg/mL respectively.    She had COVID infection 2 weeks prior to this. She was having fatigue, weight loss and palpitations, was having constipation and dysphagia.       Pt was started on Methimazole 03/14/2020 and Metoprolol.  TRAB elevated at 29.20 IU/L   No Fh of thyroid disease  SUBJECTIVE:    Today (03/13/2021):  Katelyn Soto is here for hyperthyroidism.   She has been noted with weight loss   Has noted decrease in appetite  She is on Prednisone due to right wrist tendonitis She has had vaginal bleed , currently on Prometrium  Denies constipation or diarrhea  Denies local neck swelling Denies palpitations    Methimazole 10 mg, half a tablet on Saturday and Sunday and 1 tablet the rest of thew week   HOME ENDOCRINE MEDICATIONS: Methimazole 10 mg, 1 tab daily  Toprol XL 25 mg daily      HISTORY:  Past Medical History:  Past Medical History:  Diagnosis Date   Anemia    Cardiac murmur 02/10/2020   Chest tightness 11/14/2017   Cigarette smoker 11/14/2017   Graves disease 07/04/2020   History of COVID-19 02/10/2020   Hyperthyroidism 03/10/2020   Iron deficiency anemia 01/14/2016   Palpitations 02/10/2020   Shortness of breath 02/10/2020   Tachycardia 02/10/2020   Vitamin D deficiency 01/14/2016   Past Surgical History:  Past Surgical  History:  Procedure Laterality Date   ESSURE TUBAL LIGATION     Social History:  reports that she has never smoked. She has never used smokeless tobacco. She reports current alcohol use. She reports that she does not use drugs. Family History:  Family History  Problem Relation Age of Onset   Hypertension Mother    Diabetes Mother    Heart disease Mother    Heart failure Mother    Cancer Father        throat     HOME MEDICATIONS: Allergies as of 03/13/2021   No Known Allergies      Medication List        Accurate as of March 13, 2021  9:06 AM. If you have any questions, ask your nurse or doctor.          STOP taking these medications    fluticasone 50 MCG/ACT nasal spray Commonly known as: FLONASE Stopped by: Scarlette Shorts, MD   hydrOXYzine 25 MG tablet Commonly known as: ATARAX/VISTARIL Stopped by: Scarlette Shorts, MD       TAKE these medications    ibuprofen 600 MG tablet Commonly known as: ADVIL Take 1 tablet (600 mg total) by mouth every 6 (six) hours as needed.   methimazole 10 MG tablet Commonly known as: TAPAZOLE Take 1 tablet (10 mg total) by mouth daily.   metoprolol succinate 25 MG 24 hr tablet Commonly  known as: TOPROL-XL Take 25 mg by mouth daily.   predniSONE 10 MG (21) Tbpk tablet Commonly known as: STERAPRED UNI-PAK 21 TAB Take by mouth daily. Take 6 tabs by mouth daily  for 2 days, then 5 tabs for 2 days, then 4 tabs for 2 days, then 3 tabs for 2 days, 2 tabs for 2 days, then 1 tab by mouth daily for 2 days What changed: Another medication with the same name was removed. Continue taking this medication, and follow the directions you see here. Changed by: Scarlette Shorts, MD   progesterone 100 MG capsule Commonly known as: PROMETRIUM Take 1 capsule (100 mg total) by mouth daily.          OBJECTIVE:   PHYSICAL EXAM: VS: BP 130/82 (BP Location: Left Arm, Patient Position: Sitting, Cuff Size: Small)   Pulse  60   Ht 5\' 4"  (1.626 m)   Wt 189 lb (85.7 kg)   LMP 02/15/2021   SpO2 98%   BMI 32.44 kg/m    EXAM: General: Pt appears well and is in NAD  Neck: General: Supple without adenopathy. Thyroid: Thyroid gland is prominent.   No goiter or nodules appreciated.   Lungs: Clear with good BS bilat with no rales, rhonchi, or wheezes  Heart: Auscultation: RRR.  Abdomen: Normoactive bowel sounds, soft, nontender, without masses or organomegaly palpable  Extremities:  BL LE: No pretibial edema normal ROM and strength.  Mental Status: Judgment, insight: Intact Orientation: Oriented to time, place, and person Mood and affect: No depression, anxiety, or agitation     DATA REVIEWED:  Results for Katelyn Soto, Katelyn Soto (MRN Sonia Side) as of 03/14/2021 14:00  Ref. Range 03/13/2021 09:47  TSH Latest Ref Range: 0.35 - 5.50 uIU/mL 0.33 (L)  T4,Free(Direct) Latest Ref Range: 0.60 - 1.60 ng/dL 03/15/2021     Results for Katelyn Soto, Katelyn Soto (MRN Sonia Side) as of 06/27/2020 07:41  Ref. Range 03/21/2020 08:41  TRAB Latest Ref Range: <=2.00 IU/L 29.20 (H)    Thyroid ultrasound 02/24/2020 Nonspecific thyroid heterogeneity compatible with medical thyroid disease. No other significant finding or nodule by ultrasound.  ASSESSMENT / PLAN / RECOMMENDATIONS:   Hyperthyroidism secondary to Graves' Disease  - Pt is clinically and biochemically euthyroid  - No local neck symptoms  -TSH is slightly below normal, will increase methimazole to 1 tablet 6 days a week, and half a tablet 1 day a week  Medications  Change  Methimazole 10 mg, half a tablet on Sunday and 1 tablet rest of the week      2. Graves' disease:   - No extrathyroidal manifestations of Graves' disease - Up to date on eye exam (11/ 2021)  3. Menorrhagia   - She is on Progesterone through urgent care, pt to follow up with Gyn     F/U in 4 months  Labs in 2 months    Signed electronically by: 08-16-1995, MD  [facility] End[facility]   [facility] [address] [place], [place] Phone: [phone] FAX: [phone]      CC: [phone], NP [address] [place] Phone: [phone]  Fax: [phone]   Return to Endocrinology clinic as below: Future Appointments  Date Time Provider Department Center  04/25/2021 10:20 AM Revankar, 04/27/2021, MD [facility] None

## 2021-03-14 MED ORDER — METHIMAZOLE 10 MG PO TABS: 10.0000 mg | ORAL_TABLET | Freq: Every day | ORAL | 1 refills | Status: DC

## 2021-04-23 ENCOUNTER — Other Ambulatory Visit: Payer: Self-pay

## 2021-04-23 ENCOUNTER — Encounter (HOSPITAL_COMMUNITY): Payer: Self-pay

## 2021-04-23 ENCOUNTER — Ambulatory Visit (HOSPITAL_COMMUNITY)
Admission: EM | Admit: 2021-04-23 | Discharge: 2021-04-23 | Disposition: A | Discharge status: Home / Self Care | Payer: 59 | Attending: Student | Admitting: Student

## 2021-04-23 DIAGNOSIS — S161XXA Strain of muscle, fascia and tendon at neck level, initial encounter: Secondary | ICD-10-CM

## 2021-04-23 DIAGNOSIS — J111 Influenza due to unidentified influenza virus with other respiratory manifestations: Secondary | ICD-10-CM

## 2021-04-23 MED ORDER — PREDNISONE 10 MG (21) PO TBPK: ORAL_TABLET | Freq: Every day | ORAL | 0 refills | Status: DC

## 2021-04-23 MED ORDER — TIZANIDINE HCL 2 MG PO TABS: 2.0000 mg | ORAL_TABLET | Freq: Three times a day (TID) | ORAL | 0 refills | Status: DC | PRN

## 2021-04-23 NOTE — ED Provider Notes (Signed)
[facility]    CSN: [id number] Arrival date & time: 04/23/21  5176      History   Chief Complaint Chief Complaint  Patient presents with   Generalized Body Aches   Shoulder Pain   Neck Pain    HPI Katelyn Soto is a 40 y.o. female presenting with body aches, subjective fevers and chills, cough for about 5 days.  Medical history cigarette smoker.  Has taken Alka-Seltzer and Mucinex for the symptoms with some improvement.  States that she has chronic neck and shoulder pain, requesting a steroid and a muscle relaxer for this.  Denies shortness of breath, chest pain, dizziness.  Has not monitored her temperature at home.  HPI  Past Medical History:  Diagnosis Date   Anemia    Cardiac murmur 02/10/2020   Chest tightness 11/14/2017   Cigarette smoker 11/14/2017   Graves disease 07/04/2020   History of COVID-19 02/10/2020   Hyperthyroidism 03/10/2020   Iron deficiency anemia 01/14/2016   Palpitations 02/10/2020   Shortness of breath 02/10/2020   Tachycardia 02/10/2020   Vitamin D deficiency 01/14/2016    Patient Active Problem List   Diagnosis Date Noted   Obesity (BMI 30.0-34.9) 11/15/2020   Graves disease 07/04/2020   Hyperthyroidism 03/10/2020   Tachycardia 02/10/2020   History of COVID-19 02/10/2020   Shortness of breath 02/10/2020   Cardiac murmur 02/10/2020   Palpitations 02/10/2020   Anemia    Chest tightness 11/14/2017   Cigarette smoker 11/14/2017   Iron deficiency anemia 01/14/2016   Vitamin D deficiency 01/14/2016    Past Surgical History:  Procedure Laterality Date   ESSURE TUBAL LIGATION      OB History   No obstetric history on file.      Home Medications    Prior to Admission medications   Medication Sig Start Date End Date Taking? Authorizing Provider  tiZANidine (ZANAFLEX) 2 MG tablet Take 1 tablet (2 mg total) by mouth every 8 (eight) hours as needed for muscle spasms. 04/23/21  Yes Rhys Martini, PA-C  ibuprofen (ADVIL) 600 MG  tablet Take 1 tablet (600 mg total) by mouth every 6 (six) hours as needed. Patient not taking: Reported on 03/13/2021 12/12/20   Domenick Gong, MD  methimazole (TAPAZOLE) 10 MG tablet Take 1 tablet (10 mg total) by mouth daily. 03/14/21   Shamleffer, Konrad Dolores, MD  metoprolol succinate (TOPROL-XL) 25 MG 24 hr tablet Take 25 mg by mouth daily.    [provider]  predniSONE (STERAPRED UNI-PAK 21 TAB) 10 MG (21) TBPK tablet Take by mouth daily. Take 6 tabs by mouth daily  for 2 days, then 5 tabs for 2 days, then 4 tabs for 2 days, then 3 tabs for 2 days, 2 tabs for 2 days, then 1 tab by mouth daily for 2 days 04/23/21   Rhys Martini, PA-C  progesterone (PROMETRIUM) 100 MG capsule Take 1 capsule (100 mg total) by mouth daily. 03/09/21   Coralyn Mark, NP  sertraline (ZOLOFT) 100 MG tablet Take 100 mg by mouth daily. 10/15/17 06/21/19  [provider]    Family History Family History  Problem Relation Age of Onset   Hypertension Mother    Diabetes Mother    Heart disease Mother    Heart failure Mother    Cancer Father        throat    Social History Social History   Tobacco Use   Smoking status: Never   Smokeless tobacco: Never  Vaping Use   Vaping Use: Never used  Substance Use Topics   Alcohol use: Yes    Comment: socially   Drug use: No     Allergies   Patient has no known allergies.   Review of Systems Review of Systems  Constitutional:  Positive for chills. Negative for appetite change and fever.  HENT:  Positive for congestion. Negative for ear pain, rhinorrhea, sinus pressure, sinus pain and sore throat.   Eyes:  Negative for redness and visual disturbance.  Respiratory:  Negative for cough, chest tightness, shortness of breath and wheezing.   Cardiovascular:  Negative for chest pain and palpitations.  Gastrointestinal:  Negative for abdominal pain, constipation, diarrhea, nausea and vomiting.  Genitourinary:  Negative for dysuria,  frequency and urgency.  Musculoskeletal:  Positive for myalgias.  Neurological:  Negative for dizziness, weakness and headaches.  Psychiatric/Behavioral:  Negative for confusion.   All other systems reviewed and are negative.   Physical Exam Triage Vital Signs ED Triage Vitals  Enc Vitals Group     BP 04/23/21 0940 110/74     Pulse Rate 04/23/21 0940 63     Resp 04/23/21 0940 18     Temp 04/23/21 0940 98.5 F (36.9 C)     Temp Source 04/23/21 0940 Oral     SpO2 04/23/21 0940 97 %     Weight --      Height --      Head Circumference --      Peak Flow --      Pain Score 04/23/21 0944 8     Pain Loc --      Pain Edu? --      Excl. in GC? --    No data found.  Updated Vital Signs BP 110/74 (BP Location: Right Arm)   Pulse 63   Temp 98.5 F (36.9 C) (Oral)   Resp 18   SpO2 97%   Visual Acuity Right Eye Distance:   Left Eye Distance:   Bilateral Distance:    Right Eye Near:   Left Eye Near:    Bilateral Near:     Physical Exam Vitals reviewed.  Constitutional:      General: She is not in acute distress.    Appearance: Normal appearance. She is ill-appearing.  HENT:     Head: Normocephalic and atraumatic.     Right Ear: Tympanic membrane, ear canal and external ear normal. No tenderness. No middle ear effusion. There is no impacted cerumen. Tympanic membrane is not perforated, erythematous, retracted or bulging.     Left Ear: Tympanic membrane, ear canal and external ear normal. No tenderness.  No middle ear effusion. There is no impacted cerumen. Tympanic membrane is not perforated, erythematous, retracted or bulging.     Nose: Nose normal. No congestion.     Mouth/Throat:     Mouth: Mucous membranes are moist.     Pharynx: Uvula midline. No oropharyngeal exudate or posterior oropharyngeal erythema.  Eyes:     Extraocular Movements: Extraocular movements intact.     Pupils: Pupils are equal, round, and reactive to light.  Cardiovascular:     Rate and Rhythm:  Normal rate and regular rhythm.     Heart sounds: Normal heart sounds.  Pulmonary:     Effort: Pulmonary effort is normal.     Breath sounds: Normal breath sounds. No decreased breath sounds, wheezing, rhonchi or rales.  Abdominal:     Palpations: Abdomen is soft.     Tenderness: There  is no abdominal tenderness. There is no guarding or rebound.  Lymphadenopathy:     Cervical: No cervical adenopathy.     Right cervical: No superficial cervical adenopathy.    Left cervical: No superficial cervical adenopathy.  Neurological:     General: No focal deficit present.     Mental Status: She is alert and oriented to person, place, and time.  Psychiatric:        Mood and Affect: Mood normal.        Behavior: Behavior normal.        Thought Content: Thought content normal.        Judgment: Judgment normal.     UC Treatments / Results  Labs (all labs ordered are listed, but only abnormal results are displayed) Labs Reviewed - No data to display  EKG   Radiology No results found.  Procedures Procedures (including critical care time)  Medications Ordered in UC Medications - No data to display  Initial Impression / Assessment and Plan / UC Course  I have reviewed the triage vital signs and the nursing notes.  Pertinent labs & imaging results that were available during my care of the patient were reviewed by me and considered in my medical decision making (see chart for details).     This patient is a very pleasant 40 y.o. year old female presenting with suspected influenza. Today this pt is afebrile nontachycardic nontachypneic, oxygenating well on room air, no wheezes rhonchi or rales. States she is not pregnant or breastfeeding.  Rapid influenza test deferred given duration of symptoms. She is out of the Tamiflu window. Continue Mucinex, Alka-Seltzer, etc. For muscle strain-Zanaflex and prednisone sent..   ED return precautions discussed. Patient verbalizes understanding and  agreement.    Final Clinical Impressions(s) / UC Diagnoses   Final diagnoses:  Influenza with respiratory manifestation  Strain of neck muscle, initial encounter     Discharge Instructions      -Start the muscle relaxer-Zanaflex (tizanidine), up to 3 times daily for muscle spasms and pain.  This can make you drowsy, so take at bedtime or when you do not need to drive or operate machinery. -Prednisone taper for cough/bronchitis. I recommend taking this in the morning as it could give you energy.  Avoid NSAIDs like ibuprofen and alleve while taking this medication as they can increase your risk of stomach upset and even GI bleeding when in combination with a steroid. You can continue tylenol (acetaminophen) up to 1000mg  3x daily. -For fevers/chills, bodyaches, headaches- You can take Tylenol up to 1000 mg 3 times daily  -Drink plenty of water/gatorade and get plenty of rest -With a virus, you're typically contagious for 5-7 days, or as long as you're having fevers.  -Come back and see [place] if things are getting worse instead of better, like shortness of breath, chest pain, fevers and chills that are getting higher instead of lower and do not come down with Tylenol or ibuprofen, etc.    ED Prescriptions     Medication Sig Dispense Auth. Provider   predniSONE (STERAPRED UNI-PAK 21 TAB) 10 MG (21) TBPK tablet Take by mouth daily. Take 6 tabs by mouth daily  for 2 days, then 5 tabs for 2 days, then 4 tabs for 2 days, then 3 tabs for 2 days, 2 tabs for 2 days, then 1 tab by mouth daily for 2 days 42 tablet Marin Roberts E, PA-C   tiZANidine (ZANAFLEX) 2 MG tablet Take 1 tablet (2 mg total)  by mouth every 8 (eight) hours as needed for muscle spasms. 21 tablet Hazel Sams, PA-C      PDMP not reviewed this encounter.   Hazel Sams, PA-C 04/23/21 1037

## 2021-04-23 NOTE — Discharge Instructions (Addendum)
-  Start the muscle relaxer-Zanaflex (tizanidine), up to 3 times daily for muscle spasms and pain.  This can make you drowsy, so take at bedtime or when you do not need to drive or operate machinery. -Prednisone taper for cough/bronchitis. I recommend taking this in the morning as it could give you energy.  Avoid NSAIDs like ibuprofen and alleve while taking this medication as they can increase your risk of stomach upset and even GI bleeding when in combination with a steroid. You can continue tylenol (acetaminophen) up to 1000mg  3x daily. -For fevers/chills, bodyaches, headaches- You can take Tylenol up to 1000 mg 3 times daily  -Drink plenty of water/gatorade and get plenty of rest -With a virus, you're typically contagious for 5-7 days, or as long as you're having fevers.  -Come back and see if things are getting worse instead of better, like shortness of breath, chest pain, fevers and chills that are getting higher instead of lower and do not come down with Tylenol or ibuprofen, etc.

## 2021-04-23 NOTE — ED Triage Notes (Signed)
Pt presents with  bilateral neck pain, bilateral shoulder pain, and generalized body pain X 3 days.

## 2021-04-24 ENCOUNTER — Other Ambulatory Visit: Payer: Self-pay

## 2021-04-25 ENCOUNTER — Other Ambulatory Visit: Payer: Self-pay

## 2021-04-25 ENCOUNTER — Ambulatory Visit (INDEPENDENT_AMBULATORY_CARE_PROVIDER_SITE_OTHER): Payer: 59 | Admitting: Cardiology

## 2021-04-25 ENCOUNTER — Encounter: Payer: Self-pay | Admitting: Cardiology

## 2021-04-25 VITALS — BP 114/68 | HR 64 | Ht 64.0 in | Wt 189.0 lb

## 2021-04-25 DIAGNOSIS — R002 Palpitations: Secondary | ICD-10-CM

## 2021-04-25 DIAGNOSIS — E669 Obesity, unspecified: Secondary | ICD-10-CM | POA: Diagnosis not present

## 2021-04-25 DIAGNOSIS — R000 Tachycardia, unspecified: Secondary | ICD-10-CM

## 2021-04-25 NOTE — Patient Instructions (Signed)

## 2021-04-25 NOTE — Progress Notes (Signed)
Cardiology Office Note:    Date:  04/25/2021   ID:  ARMA SCHMUCKER, DOB [birth year], MRN [id number]  PCP:  Barbette Merino, NP  Cardiologist:  Garwin Brothers, MD   Referring MD: Barbette Merino, NP    ASSESSMENT:    1. Tachycardia   2. Palpitations   3. Obesity (BMI 30.0-34.9)    PLAN:    In order of problems listed above:  Primary prevention stressed with the patient.  Importance of compliance with diet medication stressed and she vocalized understanding. Essential hypertension: Blood pressure stable and diet was emphasized. Obesity: Weight reduction stressed risks of obesity emphasized.  She was advised to walk half an hour a day 5 days a week at least and she promises to do so. Palpitations: On beta-blocker and these have resolved. Ex-smoker: Promises never to go back to smoking again. Patient will be seen in follow-up appointment in 6 months or earlier if the patient has any concerns    Medication Adjustments/Labs and Tests Ordered: Current medicines are reviewed at length with the patient today.  Concerns regarding medicines are outlined above.  Orders Placed This Encounter  Procedures   EKG 12-Lead    No orders of the defined types were placed in this encounter.    No chief complaint on file.    History of Present Illness:    Katelyn Soto is a 40 y.o. female.  Patient has past medical history of essential hypertension and palpitations.  She denies any problems at this time and takes care of activities of daily living.  No chest pain orthopnea at the time of my evaluation, the patient is alert awake oriented and in no distress.  She leads a sedentary lifestyle.  Past Medical History:  Diagnosis Date   Anemia    Cardiac murmur 02/10/2020   Chest tightness 11/14/2017   Cigarette smoker 11/14/2017   Graves disease 07/04/2020   History of COVID-19 02/10/2020   Hyperthyroidism 03/10/2020   Iron deficiency anemia 01/14/2016   Obesity (BMI 30.0-34.9) 11/15/2020    Palpitations 02/10/2020   Shortness of breath 02/10/2020   Tachycardia 02/10/2020   Vitamin D deficiency 01/14/2016    Past Surgical History:  Procedure Laterality Date   ESSURE TUBAL LIGATION      Current Medications: No outpatient medications have been marked as taking for the 04/25/21 encounter (Office Visit) with Ezrah Dembeck, Aundra Dubin, MD.     Allergies:   Patient has no known allergies.   Social History   Socioeconomic History   Marital status: Legally Separated    Spouse name: Not on file   Number of children: Not on file   Years of education: Not on file   Highest education level: Not on file  Occupational History   Not on file  Tobacco Use   Smoking status: Never   Smokeless tobacco: Never  Vaping Use   Vaping Use: Never used  Substance and Sexual Activity   Alcohol use: Yes    Comment: socially   Drug use: No   Sexual activity: Not on file  Other Topics Concern   Not on file  Social History Narrative   Not on file   Social Determinants of Health   Financial Resource Strain: Not on file  Food Insecurity: Not on file  Transportation Needs: Not on file  Physical Activity: Not on file  Stress: Not on file  Social Connections: Not on file     Family History: The patient's family history includes Cancer  in her father; Diabetes in her mother; Heart disease in her mother; Heart failure in her mother; Hypertension in her mother.  ROS:   Please see the history of present illness.    All other systems reviewed and are negative.  EKGs/Labs/Other Studies Reviewed:    The following studies were reviewed today: I discussed my findings with the patient at length.   Recent Labs: 11/15/2020: ALT 9; BUN 11; Creatinine, Ser 0.90; Potassium 4.4; Sodium 139 03/13/2021: TSH 0.33  Recent Lipid Panel    Component Value Date/Time   CHOL 142 11/15/2020 1031   TRIG 71 11/15/2020 1031   HDL 43 11/15/2020 1031   CHOLHDL 3.3 11/15/2020 1031   LDLCALC 85 11/15/2020 1031     Physical Exam:    VS:  BP 114/68   Pulse 64   Ht 5\' 4"  (1.626 m)   Wt 189 lb 0.6 oz (85.7 kg)   SpO2 98%   BMI 32.45 kg/m     Wt Readings from Last 3 Encounters:  04/25/21 189 lb 0.6 oz (85.7 kg)  03/13/21 189 lb (85.7 kg)  11/15/20 197 lb 1.3 oz (89.4 kg)     GEN: Patient is in no acute distress HEENT: Normal NECK: No JVD; No carotid bruits LYMPHATICS: No lymphadenopathy CARDIAC: Hear sounds regular, 2/6 systolic murmur at the apex. RESPIRATORY:  Clear to auscultation without rales, wheezing or rhonchi  ABDOMEN: Soft, non-tender, non-distended MUSCULOSKELETAL:  No edema; No deformity  SKIN: Warm and dry NEUROLOGIC:  Alert and oriented x 3 PSYCHIATRIC:  Normal affect   Signed, Garwin Brothers, MD  04/25/2021 10:45 AM    [facility]

## 2021-05-14 ENCOUNTER — Other Ambulatory Visit: Payer: 59

## 2021-06-02 ENCOUNTER — Other Ambulatory Visit: Payer: Self-pay | Admitting: Cardiology

## 2021-06-04 ENCOUNTER — Other Ambulatory Visit (INDEPENDENT_AMBULATORY_CARE_PROVIDER_SITE_OTHER): Payer: 59

## 2021-06-04 DIAGNOSIS — E0500 Thyrotoxicosis with diffuse goiter without thyrotoxic crisis or storm: Secondary | ICD-10-CM | POA: Diagnosis not present

## 2021-06-05 LAB — T4, FREE: Free T4: 1.07 ng/dL (ref 0.60–1.60)

## 2021-06-05 LAB — TSH: TSH: 0.4 u[IU]/mL (ref 0.35–5.50)

## 2021-07-16 NOTE — Progress Notes (Unsigned)
Name: Katelyn Soto  MRN/ DOB: [id number], 03/26/1981    Age/ Sex: 41 y.o., female     PCP: Barbette Merino, NP   Reason for Endocrinology Evaluation: Hyperthyroidism     Initial Endocrinology Clinic Visit: 03/21/2020    PATIENT IDENTIFIER: Katelyn Soto is a 41 y.o., female with a past medical history of anemia and hyperthyroidism.  She has followed with [facility] since 03/21/2020 for consultative assistance with management of her hyperthyroidism  HISTORICAL SUMMARY:    She has been diagnosed with hyperthyroidism in 01/2020 with a suppressed TSH < 0.005 uIU/mL with an elevated T4 and FT3 at 7.77 ng/dL and 44.0 pg/mL respectively.    She had COVID infection 2 weeks prior to this. She was having fatigue, weight loss and palpitations, was having constipation and dysphagia.       Pt was started on Methimazole 03/14/2020 and Metoprolol.  TRAB elevated at 29.20 IU/L   No Fh of thyroid disease  SUBJECTIVE:    Today (07/16/2021):  Katelyn Soto is here for hyperthyroidism.   She has been noted with weight loss   Has noted decrease in appetite  She is on Prednisone due to right wrist tendonitis She has had vaginal bleed , currently on Prometrium  Denies constipation or diarrhea  Denies local neck swelling Denies palpitations    Methimazole 10 mg, half a tablet on Saturday and 1 tablet the rest of thew week   HOME ENDOCRINE MEDICATIONS: Methimazole 10 mg, 1 tab daily  Toprol XL 25 mg daily      HISTORY:  Past Medical History:  Past Medical History:  Diagnosis Date   Anemia    Cardiac murmur 02/10/2020   Chest tightness 11/14/2017   Cigarette smoker 11/14/2017   Graves disease 07/04/2020   History of COVID-19 02/10/2020   Hyperthyroidism 03/10/2020   Iron deficiency anemia 01/14/2016   Obesity (BMI 30.0-34.9) 11/15/2020   Palpitations 02/10/2020   Shortness of breath 02/10/2020   Tachycardia 02/10/2020   Vitamin D deficiency 01/14/2016   Past Surgical  History:  Past Surgical History:  Procedure Laterality Date   ESSURE TUBAL LIGATION     Social History:  reports that she has never smoked. She has never used smokeless tobacco. She reports current alcohol use. She reports that she does not use drugs. Family History:  Family History  Problem Relation Age of Onset   Hypertension Mother    Diabetes Mother    Heart disease Mother    Heart failure Mother    Cancer Father        throat     HOME MEDICATIONS: Allergies as of 07/17/2021   No Known Allergies      Medication List        Accurate as of July 16, 2021  3:59 PM. If you have any questions, ask your nurse or doctor.          folic acid 1 MG tablet Commonly known as: FOLVITE Take 1 mg by mouth daily.   Lo Loestrin Fe 1 MG-10 MCG / 10 MCG tablet Generic drug: Norethindrone-Ethinyl Estradiol-Fe Biphas Take 1 tablet by mouth daily.   methimazole 10 MG tablet Commonly known as: TAPAZOLE Take 1 tablet (10 mg total) by mouth daily.   metoprolol succinate 25 MG 24 hr tablet Commonly known as: TOPROL-XL TAKE 1 TABLET BY MOUTH EVERY DAY   predniSONE 10 MG (21) Tbpk tablet Commonly known as: STERAPRED UNI-PAK 21 TAB Take by mouth daily. Take  6 tabs by mouth daily  for 2 days, then 5 tabs for 2 days, then 4 tabs for 2 days, then 3 tabs for 2 days, 2 tabs for 2 days, then 1 tab by mouth daily for 2 days   tiZANidine 2 MG tablet Commonly known as: ZANAFLEX Take 1 tablet (2 mg total) by mouth every 8 (eight) hours as needed for muscle spasms.          OBJECTIVE:   PHYSICAL EXAM: VS: There were no vitals taken for this visit.   EXAM: General: Pt appears well and is in NAD  Neck: General: Supple without adenopathy. Thyroid: Thyroid gland is prominent.   No goiter or nodules appreciated.   Lungs: Clear with good BS bilat with no rales, rhonchi, or wheezes  Heart: Auscultation: RRR.  Abdomen: Normoactive bowel sounds, soft, nontender, without masses or  organomegaly palpable  Extremities:  BL LE: No pretibial edema normal ROM and strength.  Mental Status: Judgment, insight: Intact Orientation: Oriented to time, place, and person Mood and affect: No depression, anxiety, or agitation     DATA REVIEWED:  Results for Katelyn Soto, Katelyn Soto (MRN [id number]) as of 03/14/2021 14:00  Ref. Range 03/13/2021 09:47  TSH Latest Ref Range: 0.35 - 5.50 uIU/mL 0.33 (L)  T4,Free(Direct) Latest Ref Range: 0.60 - 1.60 ng/dL 7.42     Results for Katelyn Soto, Katelyn Soto (MRN [id number]) as of 06/27/2020 07:41  Ref. Range 03/21/2020 08:41  TRAB Latest Ref Range: <=2.00 IU/L 29.20 (H)    Thyroid ultrasound 02/24/2020 Nonspecific thyroid heterogeneity compatible with medical thyroid disease. No other significant finding or nodule by ultrasound.  ASSESSMENT / PLAN / RECOMMENDATIONS:   Hyperthyroidism secondary to Graves' Disease  - Pt is clinically and biochemically euthyroid  - No local neck symptoms  -TSH is slightly below normal, will increase methimazole to 1 tablet 6 days a week, and half a tablet 1 day a week  Medications  Change  Methimazole 10 mg, half a tablet on Sunday and 1 tablet rest of the week      2. Graves' disease:   - No extrathyroidal manifestations of Graves' disease - Up to date on eye exam (11/ 2021)     F/U in 4 months  Labs in 2 months    Signed electronically by: Lyndle Herrlich, MD  [facility] End[facility]  [facility] [address] [place] [place], [place] [address] Phone: [phone] FAX: [phone]      CC: Barbette Merino, NP [address] [place] [address] Phone: [phone]  Fax: [phone]   Return to Endocrinology clinic as below: Future Appointments  Date Time Provider Department Center  07/17/2021  8:30 AM Racine Erby, Konrad Dolores, MD [id number]

## 2021-07-17 ENCOUNTER — Ambulatory Visit: Payer: Medicaid Other | Admitting: Internal Medicine

## 2021-07-22 DIAGNOSIS — M778 Other enthesopathies, not elsewhere classified: Secondary | ICD-10-CM | POA: Diagnosis not present

## 2021-07-22 DIAGNOSIS — M25531 Pain in right wrist: Secondary | ICD-10-CM | POA: Diagnosis not present

## 2021-08-01 IMAGING — CR DG CHEST 2V
2 series · 2 of 2 positions shown · non-contrast
Comparison: February 09, 2020

CLINICAL DATA: Chest pain and irregular heart beat.

EXAM:
CHEST - 2 VIEW

[w chest pa]
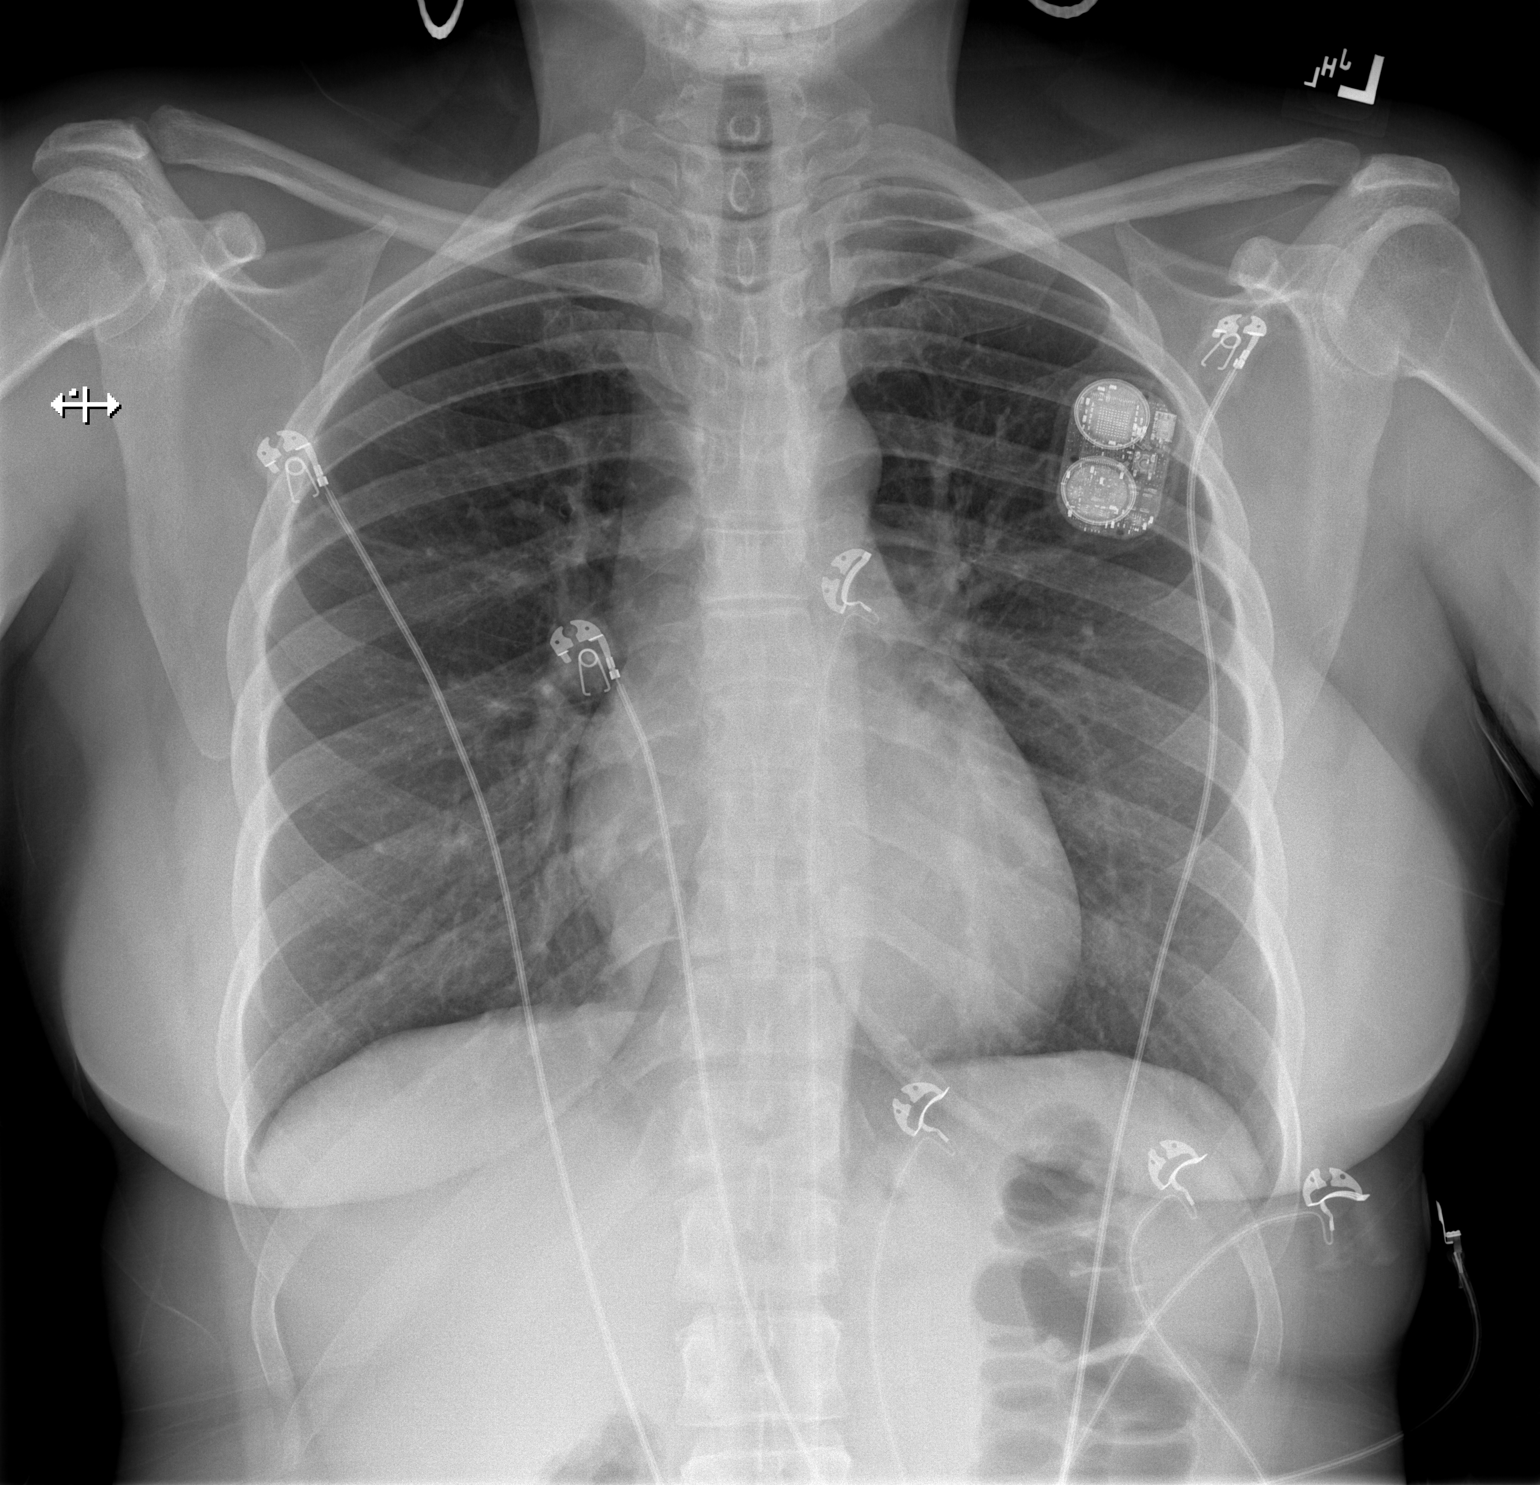

[w chest lat]
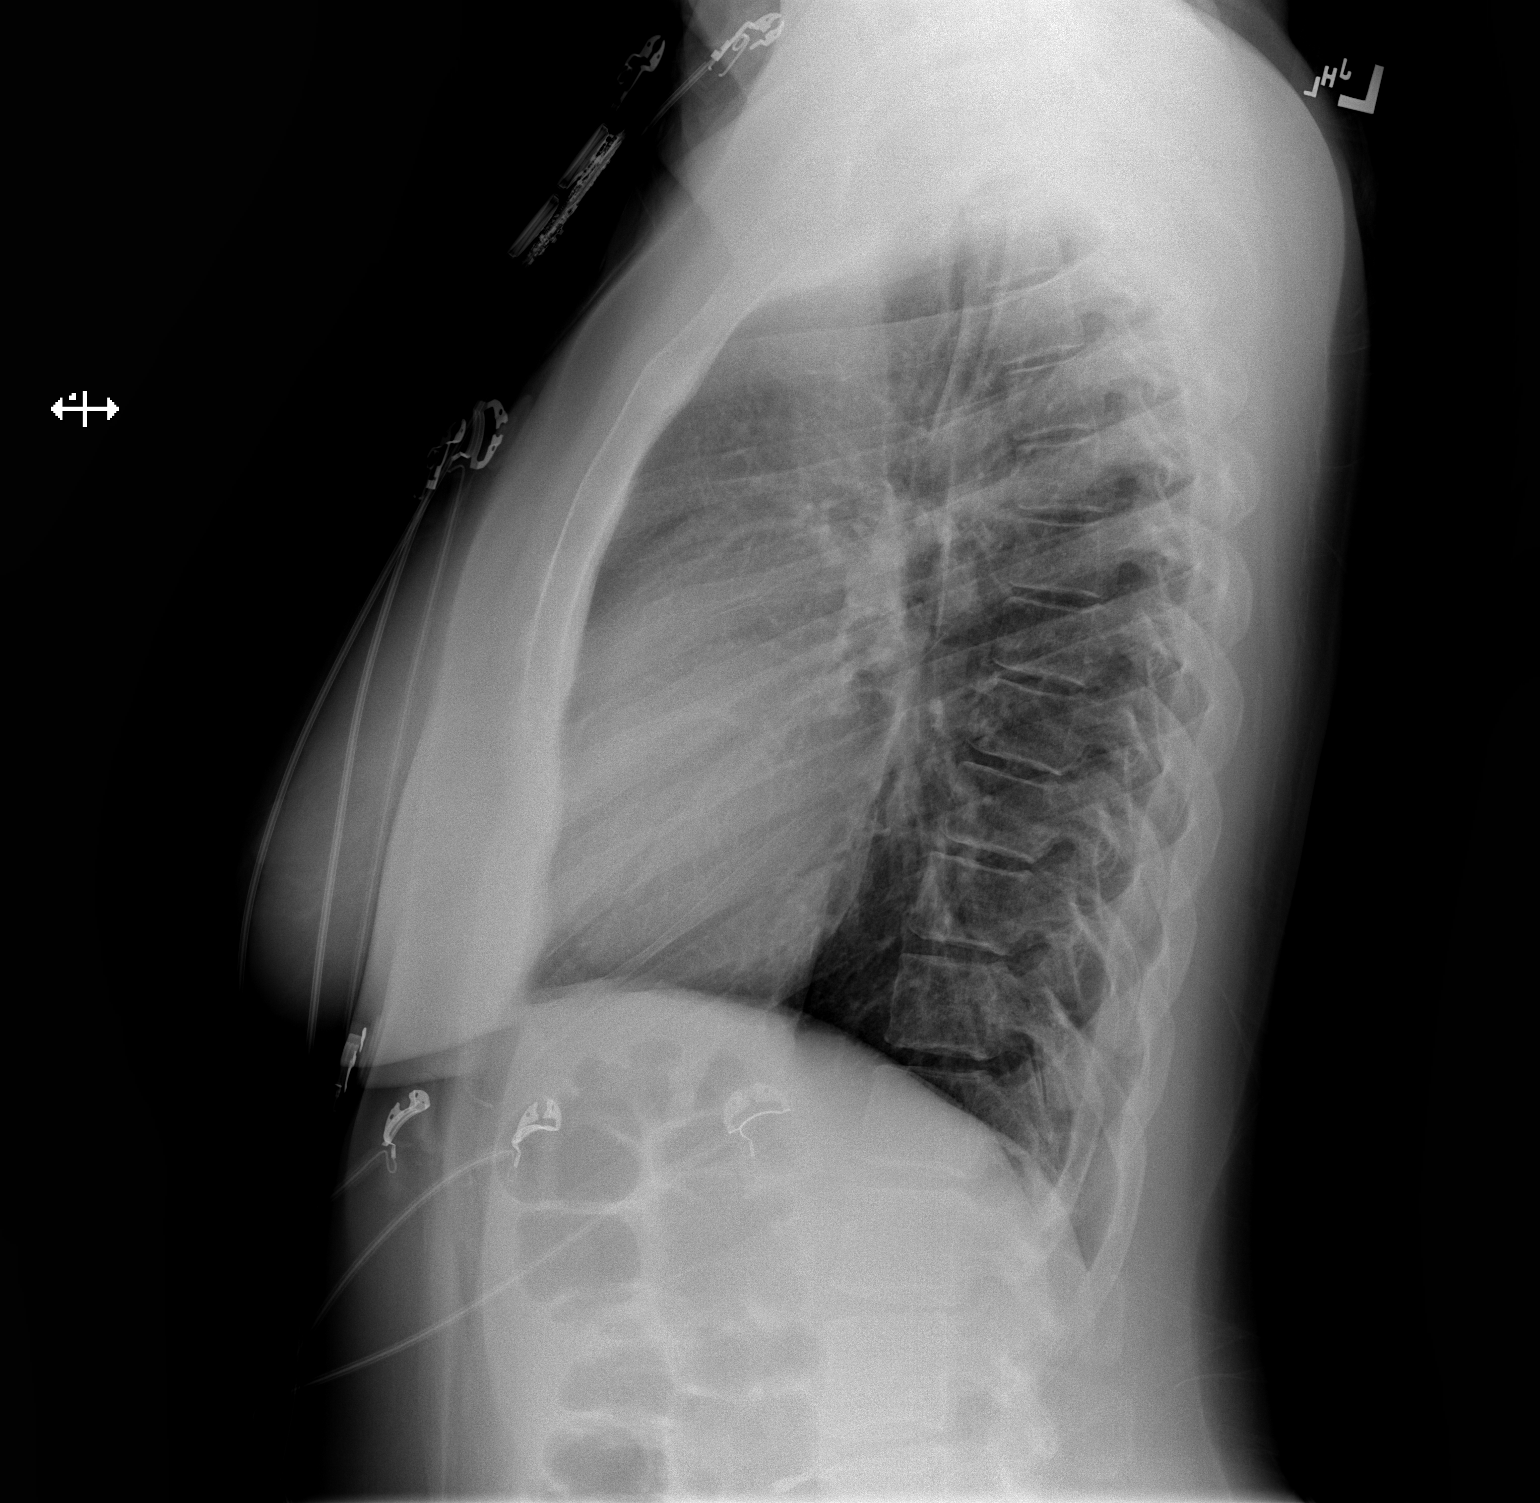

[2 of 2 positions shown; findings below may reference images not displayed]

FINDINGS: The heart size and mediastinal contours are within normal limits.
Both lungs are clear. The visualized skeletal structures are
unremarkable.
IMPRESSION: No active cardiopulmonary disease.

## 2021-08-08 ENCOUNTER — Ambulatory Visit: Payer: Medicaid Other | Admitting: Internal Medicine

## 2021-08-08 ENCOUNTER — Other Ambulatory Visit: Payer: Self-pay

## 2021-08-08 ENCOUNTER — Encounter: Payer: Self-pay | Admitting: Internal Medicine

## 2021-08-08 VITALS — BP 128/70 | Ht 64.0 in | Wt 190.0 lb

## 2021-08-08 DIAGNOSIS — M25531 Pain in right wrist: Secondary | ICD-10-CM | POA: Diagnosis not present

## 2021-08-08 DIAGNOSIS — E0500 Thyrotoxicosis with diffuse goiter without thyrotoxic crisis or storm: Secondary | ICD-10-CM | POA: Diagnosis not present

## 2021-08-08 DIAGNOSIS — E0590 Thyrotoxicosis, unspecified without thyrotoxic crisis or storm: Secondary | ICD-10-CM | POA: Diagnosis not present

## 2021-08-08 LAB — COMPREHENSIVE METABOLIC PANEL
ALT: 13 U/L (ref 0–35)
AST: 10 U/L (ref 0–37)
Albumin: 3.9 g/dL (ref 3.5–5.2)
Alkaline Phosphatase: 56 U/L (ref 39–117)
BUN: 10 mg/dL (ref 6–23)
CO2: 29 mEq/L (ref 19–32)
Calcium: 9 mg/dL (ref 8.4–10.5)
Chloride: 105 mEq/L (ref 96–112)
Creatinine, Ser: 0.79 mg/dL (ref 0.40–1.20)
GFR: 92.1 mL/min (ref >60.00)
Glucose, Bld: 164 mg/dL — ABNORMAL HIGH (ref 70–99)
Potassium: 4.6 mEq/L (ref 3.5–5.1)
Sodium: 136 mEq/L (ref 135–145)
Total Bilirubin: 0.3 mg/dL (ref 0.2–1.2)
Total Protein: 6.5 g/dL (ref 6.0–8.3)

## 2021-08-08 LAB — CBC
HCT: 39.8 % (ref 36.0–46.0)
Hemoglobin: 12.7 g/dL (ref 12.0–15.0)
MCHC: 32 g/dL (ref 30.0–36.0)
MCV: 73.1 fl — ABNORMAL LOW (ref 78.0–100.0)
Platelets: 179 10*3/uL (ref 150.0–400.0)
RBC: 5.44 Mil/uL — ABNORMAL HIGH (ref 3.87–5.11)
RDW: 15.2 % (ref 11.5–15.5)
WBC: 10.4 10*3/uL (ref 4.0–10.5)

## 2021-08-08 LAB — T4, FREE: Free T4: 1.14 ng/dL (ref 0.60–1.60)

## 2021-08-08 LAB — TSH: TSH: 0.22 u[IU]/mL — ABNORMAL LOW (ref 0.35–5.50)

## 2021-08-08 NOTE — Progress Notes (Signed)
? ?Name: Katelyn Soto  ?MRN/ DOB: [id number], 1981-03-19    ?Age/ Sex: 41 y.o., female   ? ? ?PCP: Barbette Merino, NP   ?Reason for Endocrinology Evaluation: Hyperthyroidism  ?   ?Initial Endocrinology Clinic Visit: 03/21/2020  ? ? ?PATIENT IDENTIFIER: Katelyn Soto is a 41 y.o., female with a past medical history of anemia and hyperthyroidism.  She has followed with [facility] since 03/21/2020 for consultative assistance with management of her hyperthyroidism ? ?HISTORICAL SUMMARY:  ?  ?She has been diagnosed with hyperthyroidism in 01/2020 with a suppressed TSH < 0.005 uIU/mL with an elevated T4 and FT3 at 7.77 ng/dL and 62.9 pg/mL respectively.  ?  ?She had COVID infection 2 weeks prior to this. She was having fatigue, weight loss and palpitations, was having constipation and dysphagia.  ?  ? ?  ?Pt was started on Methimazole 03/14/2020 and Metoprolol.  ?TRAB elevated at 29.20 IU/L ?  ?No Fh of thyroid disease  ?SUBJECTIVE:  ? ? ?Today (08/08/2021):  Katelyn Soto is here for hyperthyroidism.  ? ? ?Weight has been stable  ?Denies constipation or diarrhea  ?Denies local neck swelling ?Denies palpitations  ?Has occasional burning  of the eyes, sees ophthalmology  ? ?She has been having intermittent right wrist pain for the past 5 months, she had prednisone in 02/2021 which helped but she is back on prednisone again last week  ? ? ?Methimazole 10 mg, half a tablet on Saturday and 1 tablet the rest of the  week  ? ? ? ? ?HOME ENDOCRINE MEDICATIONS: ?Methimazole 10 mg, 1 tab daily  ?Toprol XL 25 mg daily  ? ? ? ? ?HISTORY:  ?Past Medical History:  ?Past Medical History:  ?Diagnosis Date  ? Anemia   ? Cardiac murmur 02/10/2020  ? Chest tightness 11/14/2017  ? Cigarette smoker 11/14/2017  ? Graves disease 07/04/2020  ? History of COVID-19 02/10/2020  ? Hyperthyroidism 03/10/2020  ? Iron deficiency anemia 01/14/2016  ? Obesity (BMI 30.0-34.9) 11/15/2020  ? Palpitations 02/10/2020  ? Shortness of breath 02/10/2020  ?  Tachycardia 02/10/2020  ? Vitamin D deficiency 01/14/2016  ? ?Past Surgical History:  ?Past Surgical History:  ?Procedure Laterality Date  ? ESSURE TUBAL LIGATION    ? ?Social History:  reports that she has never smoked. She has never used smokeless tobacco. She reports current alcohol use. She reports that she does not use drugs. ?Family History:  ?Family History  ?Problem Relation Age of Onset  ? Hypertension Mother   ? Diabetes Mother   ? Heart disease Mother   ? Heart failure Mother   ? Cancer Father   ?     throat  ? ? ? ?HOME MEDICATIONS: ?Allergies as of 08/08/2021   ?No Known Allergies ?  ? ?  ?Medication List  ?  ? ?  ? Accurate as of August 08, 2021  7:46 AM. If you have any questions, ask your nurse or doctor.  ?  ?  ? ?  ? ?folic acid 1 MG tablet ?Commonly known as: FOLVITE ?Take 1 mg by mouth daily. ?  ?Lo Loestrin Fe 1 MG-10 MCG / 10 MCG tablet ?Generic drug: Norethindrone-Ethinyl Estradiol-Fe Biphas ?Take 1 tablet by mouth daily. ?  ?methimazole 10 MG tablet ?Commonly known as: TAPAZOLE ?Take 1 tablet (10 mg total) by mouth daily. ?  ?metoprolol succinate 25 MG 24 hr tablet ?Commonly known as: TOPROL-XL ?TAKE 1 TABLET BY MOUTH EVERY DAY ?  ?predniSONE  10 MG (21) Tbpk tablet ?Commonly known as: STERAPRED UNI-PAK 21 TAB ?Take by mouth daily. Take 6 tabs by mouth daily  for 2 days, then 5 tabs for 2 days, then 4 tabs for 2 days, then 3 tabs for 2 days, 2 tabs for 2 days, then 1 tab by mouth daily for 2 days ?  ?tiZANidine 2 MG tablet ?Commonly known as: ZANAFLEX ?Take 1 tablet (2 mg total) by mouth every 8 (eight) hours as needed for muscle spasms. ?  ? ?  ? ? ? ? ?OBJECTIVE:  ? ?PHYSICAL EXAM: ?VS: BP 128/70   Ht 5\' 4"  (1.626 m)   Wt 190 lb (86.2 kg)   BMI 32.61 kg/m?  ? ? ?EXAM: ?General: Pt appears well and is in NAD  ?Neck: General: Supple without adenopathy. ?Thyroid: Thyroid gland is prominent.   No goiter or nodules appreciated.   ?Lungs: Clear with good BS bilat with no rales, rhonchi, or wheezes   ?Heart: Auscultation: RRR.  ?Abdomen: Normoactive bowel sounds, soft, nontender, without masses or organomegaly palpable  ?Extremities:  ?BL LE: No pretibial edema normal ROM and strength.  ?Mental Status: Judgment, insight: Intact ?Orientation: Oriented to time, place, and person ?Mood and affect: No depression, anxiety, or agitation  ? ? ? ?DATA REVIEWED: ? ? Latest Reference Range & Units 08/08/21 08:44  ?WBC 4.0 - 10.5 K/uL 10.4  ?RBC 3.87 - 5.11 Mil/uL 5.44 (H)  ?Hemoglobin 12.0 - 15.0 g/dL 16.1  ?HCT 36.0 - 46.0 % 39.8  ?MCV 78.0 - 100.0 fl 73.1 (L)  ?MCHC 30.0 - 36.0 g/dL 09.6  ?RDW 11.5 - 15.5 % 15.2  ?Platelets 150.0 - 400.0 K/uL 179.0  ? ? Latest Reference Range & Units 08/08/21 08:44  ?Sodium 135 - 145 mEq/L 136  ?Potassium 3.5 - 5.1 mEq/L 4.6  ?Chloride 96 - 112 mEq/L 105  ?CO2 19 - 32 mEq/L 29  ?Glucose 70 - 99 mg/dL 045 (H)  ?BUN 6 - 23 mg/dL 10  ?Creatinine 0.40 - 1.20 mg/dL 4.09  ?Calcium 8.4 - 10.5 mg/dL 9.0  ?Alkaline Phosphatase 39 - 117 U/L 56  ?Albumin 3.5 - 5.2 g/dL 3.9  ?AST 0 - 37 U/L 10  ?ALT 0 - 35 U/L 13  ?Total Protein 6.0 - 8.3 g/dL 6.5  ?Total Bilirubin 0.2 - 1.2 mg/dL 0.3  ?GFR >60.00 mL/min 92.10  ? ? Latest Reference Range & Units 08/08/21 08:44  ?TSH 0.35 - 5.50 uIU/mL 0.22 (L)  ?T4,Free(Direct) 0.60 - 1.60 ng/dL 8.11  ? ? ? ? ?TRAB Latest Ref Range: <=2.00 IU/L 29.20 (H)  ? ? ?Thyroid ultrasound 02/24/2020 ?Nonspecific thyroid heterogeneity compatible with medical thyroid ?disease. No other significant finding or nodule by ultrasound. ? ?ASSESSMENT / PLAN / RECOMMENDATIONS:  ? ?Hyperthyroidism secondary to Graves' Disease ? ?- Pt is clinically  euthyroid  ?- No local neck symptoms  ?-TSH is slightly below normal, question imperfect adherence, will increase methimazole as below ?-We discussed that Graves' Disease is a result of an autoimmune condition involving the thyroid.  ? ?-We extensively discussed the various treatment options for hyperthyroidism and Graves disease including  ablation therapy with radioactive iodine versus antithyroid drug treatment versus surgical therapy.   ? ?-I carefully explained to the patient that one of the consequences of I-131 ablation treatment would likely be permanent hypothyroidism which would require long-term replacement therapy with LT4. ? ? ?Medications  ? ? ?Increase methimazole 10 mg, 1 tablet daily ? ? ?2. Graves' disease:  ? ?-  No extrathyroidal manifestations of Graves' disease ?- Up to date on eye exam 2022 ? ? ?3.  Right wrist pain: ? ?-There is no swelling on exam today but she does have tenderness over the radial aspect of her wrist joint.  She has been having intermittent pain requiring urgent care visit and prednisone intake. ?-I will refer her to [facility] for further evaluation ? ? ?F/U in 6 months  ?Labs in 2 months  ? ? ?Signed electronically by: ?Abby Raelyn Mora, MD ? ?[facility]  ?[facility] ?[address] ?[place], [place] [address] ?Phone: [phone] ?FAX: [phone]  ? ? ? ? ?CC: ?Barbette Merino, NP ?[address] ?[place] [address] ?Phone: [phone]  ?Fax: [phone] ? ? ?Return to Endocrinology clinic as below: ?Future Appointments  ?Date Time Provider Department Center  ?08/08/2021  8:10 AM [place], Konrad Dolores, MD [id number] None  ?  ? ?

## 2021-08-27 DIAGNOSIS — M654 Radial styloid tenosynovitis [de Quervain]: Secondary | ICD-10-CM | POA: Diagnosis not present

## 2021-09-17 ENCOUNTER — Telehealth: Payer: Self-pay | Admitting: Cardiology

## 2021-09-17 ENCOUNTER — Other Ambulatory Visit: Payer: Self-pay

## 2021-09-17 NOTE — Telephone Encounter (Signed)
Patient wanted to make sure she hasnt had a mini heart attack. Was looking to get check out. Please advise ?

## 2021-09-17 NOTE — Telephone Encounter (Signed)
Pt states that the pain/discomfort has since resolved. Pt denies chest pain. Pt has been advised to go to the ED if the sx return and is concerning or if she starts having nausea, vomiting or shortness of breath. Pt verbalized understanding and had no additional questions. Pt has an appt 09/18/21. ?

## 2021-09-18 ENCOUNTER — Ambulatory Visit: Payer: Medicaid Other | Admitting: Cardiology

## 2021-09-18 ENCOUNTER — Encounter: Payer: Self-pay | Admitting: Cardiology

## 2021-09-18 VITALS — BP 120/74 | HR 69 | Ht 64.0 in | Wt 188.0 lb

## 2021-09-18 DIAGNOSIS — R002 Palpitations: Secondary | ICD-10-CM

## 2021-09-18 DIAGNOSIS — E669 Obesity, unspecified: Secondary | ICD-10-CM

## 2021-09-18 DIAGNOSIS — F17210 Nicotine dependence, cigarettes, uncomplicated: Secondary | ICD-10-CM | POA: Diagnosis not present

## 2021-09-18 DIAGNOSIS — E0590 Thyrotoxicosis, unspecified without thyrotoxic crisis or storm: Secondary | ICD-10-CM

## 2021-09-18 NOTE — Progress Notes (Signed)
?Cardiology Office Note:   ? ?Date:  09/18/2021  ? ? ? ?PCP:  No primary care provider on file.  ?Cardiologist:  Garwin Brothers, MD  ? ?Referring MD: Barbette Merino, NP  ? ? ?ASSESSMENT:   ? ?1. Obesity (BMI 30.0-34.9)   ?2. Palpitations   ?3. Hyperthyroidism   ?4. Cigarette smoker   ? ?PLAN:   ? ?In order of problems listed above: ? ?Primary prevention stressed to the patient.  Importance of compliance with diet medication stressed and she vocalized understanding.  She was advised to walk at least half an hour a day 5 days a week and she promises to do so. ?Palpitations: These have resolved and she is happy about it.  She is taking a beta-blocker.  As mentioned below she is seeking treatment for hypothyroidism at this point. ?Obesity: Weight reduction stressed.  Diet was emphasized.  Lipids were reviewed.  I also counseled her and told her to refrain from smoking and the risks explained. ?Patient will be seen in follow-up appointment in 12 months or earlier if the patient has any concerns ? ? ? ?Medication Adjustments/Labs and Tests Ordered: ?Current medicines are reviewed at length with the patient today.  Concerns regarding medicines are outlined above.  ?No orders of the defined types were placed in this encounter. ? ?No orders of the defined types were placed in this encounter. ? ? ? ?No chief complaint on file. ?  ? ?History of Present Illness:   ? ?Katelyn Soto is a 41 y.o. female Patient has past medical history of palpitations and obesity.  She denies any problems at this time and takes care of activities of daily living.  No chest pain orthopnea or PND.  Her palpitations have resolved but she is being treated for hypothyroidism still.  At the time of my evaluation, the patient is alert awake oriented and in no distress.  She denies any other problems at this time.  She is here for follow-up appointment. ? ?Past Medical History:  ?Diagnosis Date  ? Anemia   ? Cardiac murmur 02/10/2020  ? Chest tightness  11/14/2017  ? Cigarette smoker 11/14/2017  ? Graves disease 07/04/2020  ? History of COVID-19 02/10/2020  ? Hyperthyroidism 03/10/2020  ? Iron deficiency anemia 01/14/2016  ? Obesity (BMI 30.0-34.9) 11/15/2020  ? Palpitations 02/10/2020  ? Shortness of breath 02/10/2020  ? Tachycardia 02/10/2020  ? Vitamin D deficiency 01/14/2016  ? ? ?Past Surgical History:  ?Procedure Laterality Date  ? ESSURE TUBAL LIGATION    ? ? ?Current Medications: ?Current Meds  ?Medication Sig  ? folic acid (FOLVITE) 1 MG tablet Take 1 mg by mouth daily.  ? methimazole (TAPAZOLE) 10 MG tablet Take 1 tablet (10 mg total) by mouth daily.  ? metoprolol succinate (TOPROL-XL) 25 MG 24 hr tablet TAKE 1 TABLET BY MOUTH EVERY DAY  ? tiZANidine (ZANAFLEX) 2 MG tablet Take 1 tablet (2 mg total) by mouth every 8 (eight) hours as needed for muscle spasms.  ?  ? ?Allergies:   Patient has no known allergies.  ? ?Social History  ? ?Socioeconomic History  ? Marital status: Legally Separated  ?  Spouse name: Not on file  ? Number of children: Not on file  ? Years of education: Not on file  ? Highest education level: Not on file  ?Occupational History  ? Not on file  ?Tobacco Use  ? Smoking status: Never  ? Smokeless tobacco: Never  ?Vaping Use  ? Vaping  Use: Never used  ?Substance and Sexual Activity  ? Alcohol use: Yes  ?  Comment: socially  ? Drug use: No  ? Sexual activity: Not on file  ?Other Topics Concern  ? Not on file  ?Social History Narrative  ? Not on file  ? ?Social Determinants of Health  ? ?Financial Resource Strain: Not on file  ?Food Insecurity: Not on file  ?Transportation Needs: Not on file  ?Physical Activity: Not on file  ?Stress: Not on file  ?Social Connections: Not on file  ?  ? ?Family History: ?The patient's family history includes Cancer in her father; Diabetes in her mother; Heart disease in her mother; Heart failure in her mother; Hypertension in her mother. ? ?ROS:   ?Please see the history of present illness.    ?All other systems  reviewed and are negative. ? ?EKGs/Labs/Other Studies Reviewed:   ? ?The following studies were reviewed today: ?EKG reveals sinus rhythm and nonspecific ST-T changes ? ? ?Recent Labs: ?08/08/2021: ALT 13; BUN 10; Creatinine, Ser 0.79; Hemoglobin 12.7; Platelets 179.0; Potassium 4.6; Sodium 136; TSH 0.22  ?Recent Lipid Panel ?   ?Component Value Date/Time  ? CHOL 142 11/15/2020 1031  ? TRIG 71 11/15/2020 1031  ? HDL 43 11/15/2020 1031  ? CHOLHDL 3.3 11/15/2020 1031  ? LDLCALC 85 11/15/2020 1031  ? ? ?Physical Exam:   ? ?VS:  BP 120/74   Pulse 69   Ht 5\' 4"  (1.626 m)   Wt 188 lb (85.3 kg)   SpO2 96%   BMI 32.27 kg/m?    ? ?Wt Readings from Last 3 Encounters:  ?09/18/21 188 lb (85.3 kg)  ?08/08/21 190 lb (86.2 kg)  ?04/25/21 189 lb 0.6 oz (85.7 kg)  ?  ? ?GEN: Patient is in no acute distress ?HEENT: Normal ?NECK: No JVD; No carotid bruits ?LYMPHATICS: No lymphadenopathy ?CARDIAC: Hear sounds regular, 2/6 systolic murmur at the apex. ?RESPIRATORY:  Clear to auscultation without rales, wheezing or rhonchi  ?ABDOMEN: Soft, non-tender, non-distended ?MUSCULOSKELETAL:  No edema; No deformity  ?SKIN: Warm and dry ?NEUROLOGIC:  Alert and oriented x 3 ?PSYCHIATRIC:  Normal affect  ? ?Signed, ?04/27/21, MD  ?09/18/2021 2:48 PM    ?[facility]  ?

## 2021-09-18 NOTE — Patient Instructions (Signed)

## 2021-10-02 ENCOUNTER — Telehealth: Payer: Self-pay

## 2021-10-02 NOTE — Telephone Encounter (Signed)
Called pt regarding vm left on 5/9. Pt vm full.  ?

## 2021-10-09 ENCOUNTER — Emergency Department (HOSPITAL_COMMUNITY)
Admission: EM | Admit: 2021-10-09 | Discharge: 2021-10-09 | Discharge status: Against Medical Advice | Payer: Medicaid Other | Attending: Emergency Medicine | Admitting: Emergency Medicine

## 2021-10-09 ENCOUNTER — Other Ambulatory Visit: Payer: Self-pay

## 2021-10-09 DIAGNOSIS — R109 Unspecified abdominal pain: Secondary | ICD-10-CM | POA: Insufficient documentation

## 2021-10-09 DIAGNOSIS — Z5321 Procedure and treatment not carried out due to patient leaving prior to being seen by health care provider: Secondary | ICD-10-CM | POA: Insufficient documentation

## 2021-10-09 LAB — CBC WITH DIFFERENTIAL/PLATELET
Abs Immature Granulocytes: 0.01 10*3/uL (ref 0.00–0.07)
Basophils Absolute: 0 10*3/uL (ref 0.0–0.1)
Basophils Relative: 1 %
Eosinophils Absolute: 0.1 10*3/uL (ref 0.0–0.5)
Eosinophils Relative: 2 %
HCT: 41.1 % (ref 36.0–46.0)
Hemoglobin: 12.5 g/dL (ref 12.0–15.0)
Immature Granulocytes: 0 %
Lymphocytes Relative: 43 %
Lymphs Abs: 2.2 10*3/uL (ref 0.7–4.0)
MCH: 23.1 pg — ABNORMAL LOW (ref 26.0–34.0)
MCHC: 30.4 g/dL (ref 30.0–36.0)
MCV: 76 fL — ABNORMAL LOW (ref 80.0–100.0)
Monocytes Absolute: 0.4 10*3/uL (ref 0.1–1.0)
Monocytes Relative: 7 %
Neutro Abs: 2.4 10*3/uL (ref 1.7–7.7)
Neutrophils Relative %: 47 %
Platelets: 138 10*3/uL — ABNORMAL LOW (ref 150–400)
RBC: 5.41 MIL/uL — ABNORMAL HIGH (ref 3.87–5.11)
RDW: 14.7 % (ref 11.5–15.5)
WBC: 5 10*3/uL (ref 4.0–10.5)
nRBC: 0 % (ref 0.0–0.2)

## 2021-10-09 LAB — COMPREHENSIVE METABOLIC PANEL
ALT: 14 U/L (ref 0–44)
AST: 11 U/L — ABNORMAL LOW (ref 15–41)
Albumin: 3.4 g/dL — ABNORMAL LOW (ref 3.5–5.0)
Alkaline Phosphatase: 40 U/L (ref 38–126)
Anion gap: 5 (ref 5–15)
BUN: 9 mg/dL (ref 6–20)
CO2: 23 mmol/L (ref 22–32)
Calcium: 8.5 mg/dL — ABNORMAL LOW (ref 8.9–10.3)
Chloride: 109 mmol/L (ref 98–111)
Creatinine, Ser: 0.93 mg/dL (ref 0.44–1.00)
GFR, Estimated: >60 mL/min (ref >60)
Glucose, Bld: 82 mg/dL (ref 70–99)
Potassium: 4.2 mmol/L (ref 3.5–5.1)
Sodium: 137 mmol/L (ref 135–145)
Total Bilirubin: 0.3 mg/dL (ref 0.3–1.2)
Total Protein: 6.7 g/dL (ref 6.5–8.1)

## 2021-10-09 LAB — URINALYSIS, ROUTINE W REFLEX MICROSCOPIC
Bilirubin Urine: NEGATIVE
Glucose, UA: NEGATIVE mg/dL
Hgb urine dipstick: NEGATIVE
Ketones, ur: NEGATIVE mg/dL
Leukocytes,Ua: NEGATIVE
Nitrite: NEGATIVE
Protein, ur: NEGATIVE mg/dL
Specific Gravity, Urine: 1.005 (ref 1.005–1.030)
pH: 7 (ref 5.0–8.0)

## 2021-10-09 LAB — LIPASE, BLOOD: Lipase: 42 U/L (ref 11–51)

## 2021-10-09 LAB — PREGNANCY, URINE: Preg Test, Ur: NEGATIVE

## 2021-10-09 NOTE — ED Provider Triage Note (Signed)
Emergency Medicine Provider Triage Evaluation Note ? ?Katelyn Soto , a 41 y.o. female  was evaluated in triage.  Pt complains of left-sided abdominal pain of about 1-1/2-week duration.  Endorses nausea but no vomiting.  Denies vaginal discharge, dysuria, abnormal vaginal bleeding.  Denies hematuria.  Does have history of ovarian cyst however states this pain feels different. ? ?Review of Systems  ?Positive: As above ?Negative: As above ? ?Physical Exam  ?BP 124/83 (BP Location: Right Arm)   Pulse 61   Temp 99.2 ?F (37.3 ?C) (Oral)   Resp 16   Ht 5\' 4"  (1.626 m)   Wt 85.3 kg   SpO2 100%   BMI 32.27 kg/m?  ?Gen:   Awake, no distress   ?Resp:  Normal effort  ?MSK:   Moves extremities without difficulty ?Other:  Abdomen nondistended and nontender.  Without flank tenderness ? ?Medical Decision Making  ?Medically screening exam initiated at 10:21 AM.  Appropriate orders placed.  Rawan S Weisenberger was informed that the remainder of the evaluation will be completed by another provider, this initial triage assessment does not replace that evaluation, and the importance of remaining in the ED until their evaluation is complete. ? ? ?  ? , PA-C ?10/09/21 1022 ? ?

## 2021-10-09 NOTE — ED Notes (Signed)
Pt states they are leaving  

## 2021-10-09 NOTE — ED Triage Notes (Signed)
Pt. Stated, Ive had left side abdominal pain for 3 weeks. I do have a cyst on ovaries for 2 months. ?

## 2021-10-18 ENCOUNTER — Encounter: Payer: Self-pay | Admitting: Obstetrics & Gynecology

## 2021-10-18 ENCOUNTER — Ambulatory Visit (INDEPENDENT_AMBULATORY_CARE_PROVIDER_SITE_OTHER): Payer: Medicaid Other | Admitting: Obstetrics & Gynecology

## 2021-10-18 VITALS — BP 110/72 | HR 51 | Wt 184.4 lb

## 2021-10-18 DIAGNOSIS — Z1231 Encounter for screening mammogram for malignant neoplasm of breast: Secondary | ICD-10-CM

## 2021-10-18 DIAGNOSIS — M79602 Pain in left arm: Secondary | ICD-10-CM | POA: Diagnosis not present

## 2021-10-18 DIAGNOSIS — R102 Pelvic and perineal pain unspecified side: Secondary | ICD-10-CM

## 2021-10-18 DIAGNOSIS — M79605 Pain in left leg: Secondary | ICD-10-CM

## 2021-10-18 DIAGNOSIS — R87810 Cervical high risk human papillomavirus (HPV) DNA test positive: Secondary | ICD-10-CM

## 2021-10-18 DIAGNOSIS — Z758 Other problems related to medical facilities and other health care: Secondary | ICD-10-CM | POA: Diagnosis not present

## 2021-10-18 DIAGNOSIS — R0602 Shortness of breath: Secondary | ICD-10-CM | POA: Diagnosis not present

## 2021-10-18 MED ORDER — IBUPROFEN 800 MG PO TABS: 800.0000 mg | ORAL_TABLET | Freq: Three times a day (TID) | ORAL | 2 refills | Status: DC | PRN

## 2021-10-18 NOTE — Progress Notes (Signed)
GYNECOLOGY OFFICE VISIT NOTE  History:   Katelyn Soto is a 41 y.o. F here today for evaluation of left sided pelvic pain x 2.5 months. It is intermittent in nature, worsened by menses, not be intercourse. Not related to eating habits.  Was seen at [facility] in 03/2021, had normal pelvic ultrasound then but was not having pain then. Switched to [place] due to insurance restrictions.  Currently, she reports no left sided pain for 2 days, but has developed left whole arm and whole leg pain with some difficulty in moving them. Has history of leg pain in past, was prescribed Zanaflex which she tool last night without much alleviation.  Also reported intermittent SOB for the past couple of weeks, no chest pain reported.  Denies any abnormal vaginal discharge, bleeding, fevers, chills, sweats, dysuria, nausea, vomiting, other GI or GU symptoms or other general symptoms.    Past Medical History:  Diagnosis Date   Anemia    Cardiac murmur 02/10/2020   Chest tightness 11/14/2017   Cigarette smoker 11/14/2017   Graves disease 07/04/2020   History of COVID-19 02/10/2020   Hyperthyroidism 03/10/2020   Iron deficiency anemia 01/14/2016   Obesity (BMI 30.0-34.9) 11/15/2020   Palpitations 02/10/2020   Shortness of breath 02/10/2020   Tachycardia 02/10/2020   Vitamin D deficiency 01/14/2016    Past Surgical History:  Procedure Laterality Date   ESSURE TUBAL LIGATION      The following portions of the patient's history were reviewed and updated as appropriate: allergies, current medications, past family history, past medical history, past social history, past surgical history and problem list.   Health Maintenance:  Normal pap and positive HRHPV (negative 16, 18/45) on 04/04/2021.  Normal mammogram on 06/23/2020, desires another one this year.   Review of Systems:  Pertinent items noted in HPI and remainder of comprehensive ROS otherwise negative.  Physical Exam:  BP 110/72   Pulse (!) 51   Wt 184 lb 6.4  oz (83.6 kg)   LMP 10/17/2021   BMI 31.65 kg/m  CONSTITUTIONAL: Well-developed, well-nourished female in no acute distress.  HEENT:  Normocephalic, atraumatic. External right and left ear normal. No scleral icterus.  NECK: Normal range of motion, supple, no masses noted on observation SKIN: No rash noted. Not diaphoretic. No erythema. No pallor. MUSCULOSKELETAL: Normal range of motion. No edema noted. NEUROLOGIC: Alert and oriented to person, place, and time. Normal muscle tone coordination. No cranial nerve deficit noted. PSYCHIATRIC: Normal mood and affect. Normal behavior. Normal judgment and thought content. CARDIOVASCULAR: Normal heart rate noted RESPIRATORY: Effort and breath sounds normal, no problems with respiration noted ABDOMEN: No tenderness on palpation. No masses noted. No other overt distention noted.   PELVIC: Deferred    Assessment and Plan:     1. Lower extremity pain, left 2. Shortness of breath 3. Pain of left upper extremity Unsure of etiology of these symptoms. No current SOB today, no CP. She was told she needs evaluation by PCP (referred) or Urgent Care.  Told to take NSAIDs and already prescribed Zanaflex as needed for extremity pain.   4. Does not have primary care provider Referred to [facility]. - Ambulatory referral to [facility]  5. Cervical high risk HPV (human papillomavirus) test positive Will need repeat co-testing in 03/2022, patient informed.   6. Breast cancer screening by mammogram Mammogram scheduled - MM 3D SCREEN BREAST BILATERAL; Future  7. Left pelvic pain in female No pain today but patient desired ultrasound.  This was ordered, will follow up results and manage accordingly.  Ibuprofen prescribed as needed for pain. Pain precautions reviewed.  - US PELVIC COMPLETE WITH TRANSVAGINAL; Future - ibuprofen (ADVIL) 800 MG tablet; Take 1 tablet (800 mg total) by mouth 3 (three) times daily with meals as needed for moderate  pain, mild pain or headache.  Dispense: 60 tablet; Refill: 2  Routine preventative health maintenance measures emphasized. Please refer to After Visit Summary for other counseling recommendations.   Return for any gynecologic concerns.    I spent 30 minutes dedicated to the care of this patient including pre-visit review of records, face to face time with the patient discussing her conditions and treatments and post visit orders.    Jaynie Collins, MD, FACOG Obstetrician & Gynecologist, [facility] for [employer], [facility]

## 2021-10-31 ENCOUNTER — Ambulatory Visit: Admission: RE | Admit: 2021-10-31 | Payer: Medicaid Other | Source: Ambulatory Visit

## 2021-10-31 ENCOUNTER — Encounter: Payer: Medicaid Other | Admitting: Family Medicine

## 2021-12-01 IMAGING — MG MM DIGITAL SCREENING BILAT W/ TOMO AND CAD
8 series · 8 of 24 positions shown · non-contrast
Comparison: Previous exam(s).

CLINICAL DATA: Screening.

EXAM:
DIGITAL SCREENING BILATERAL MAMMOGRAM WITH TOMO AND CAD

[R MLO synth-2D]
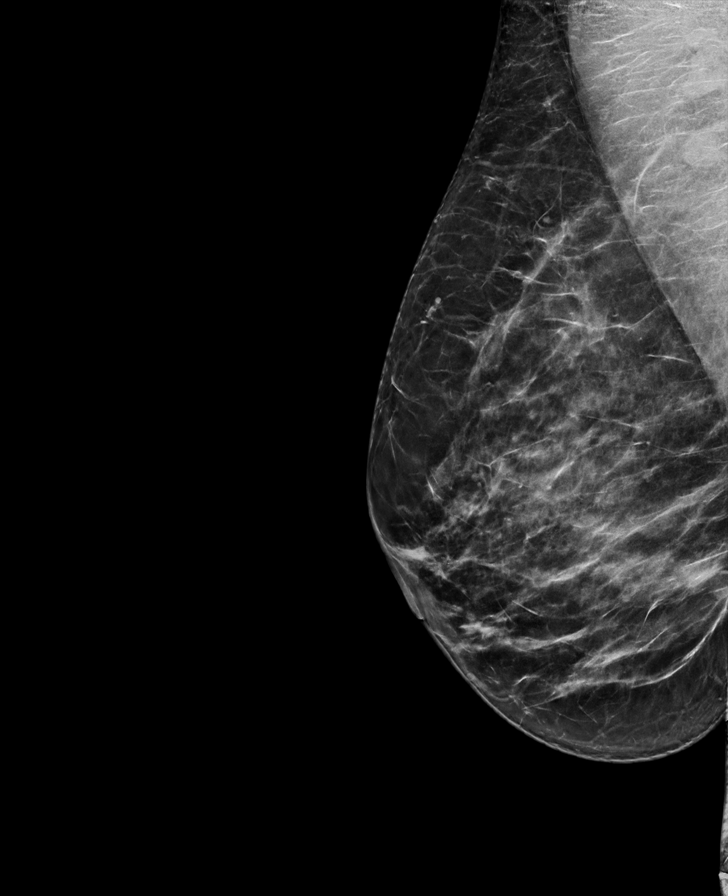

[R CC synth-2D]
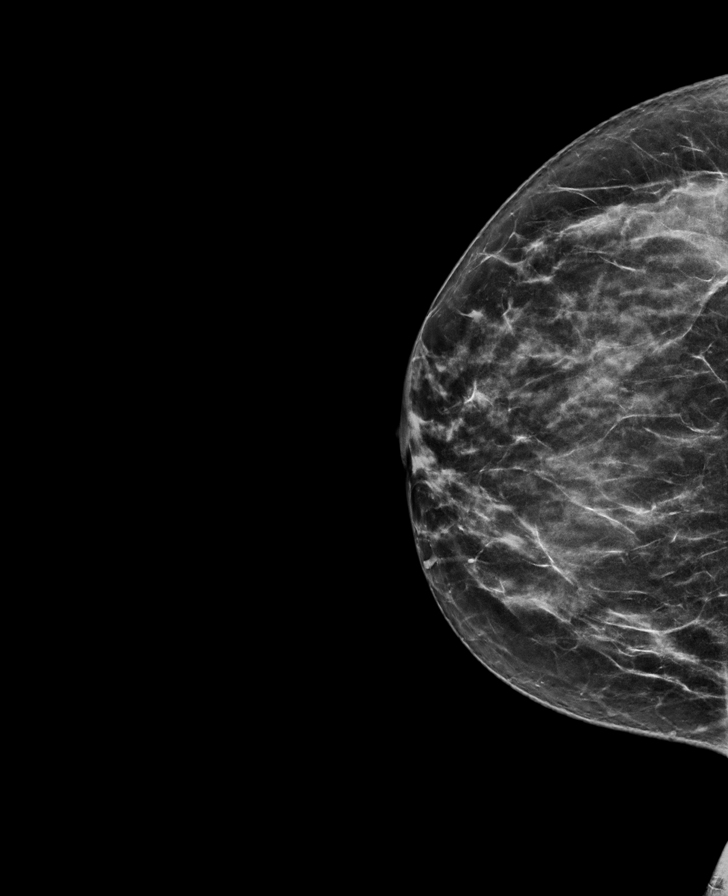

[L CC synth-2D]
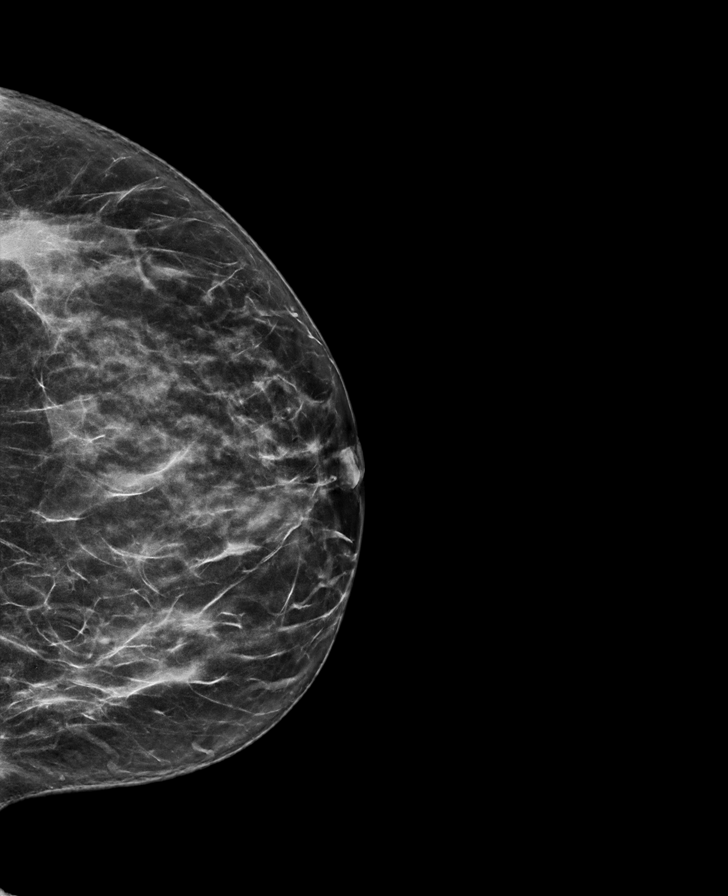

[L MLO synth-2D]
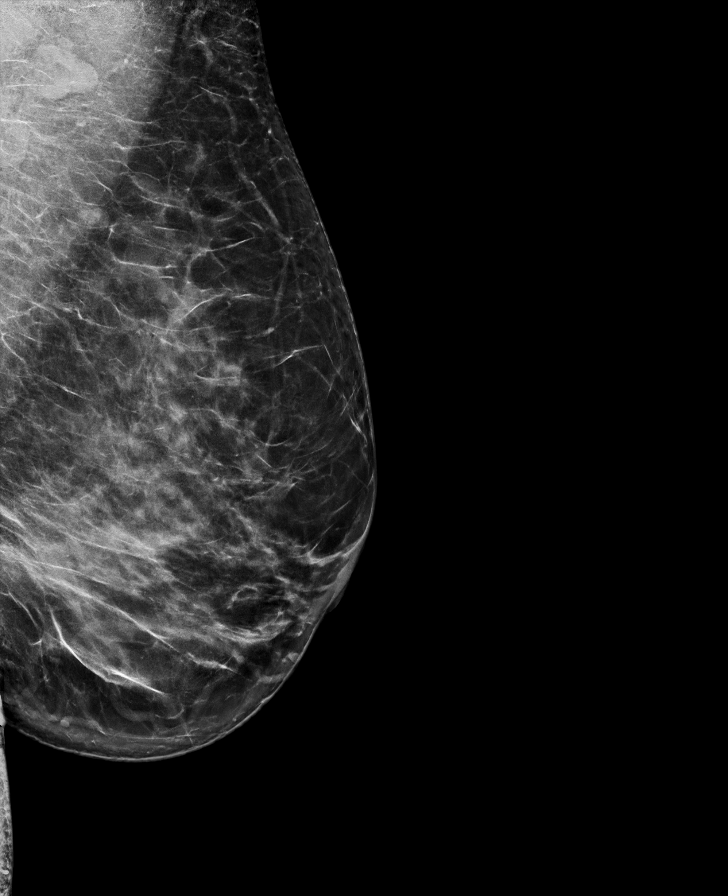

[R MLO tomo · tomo slice 37/73.0]
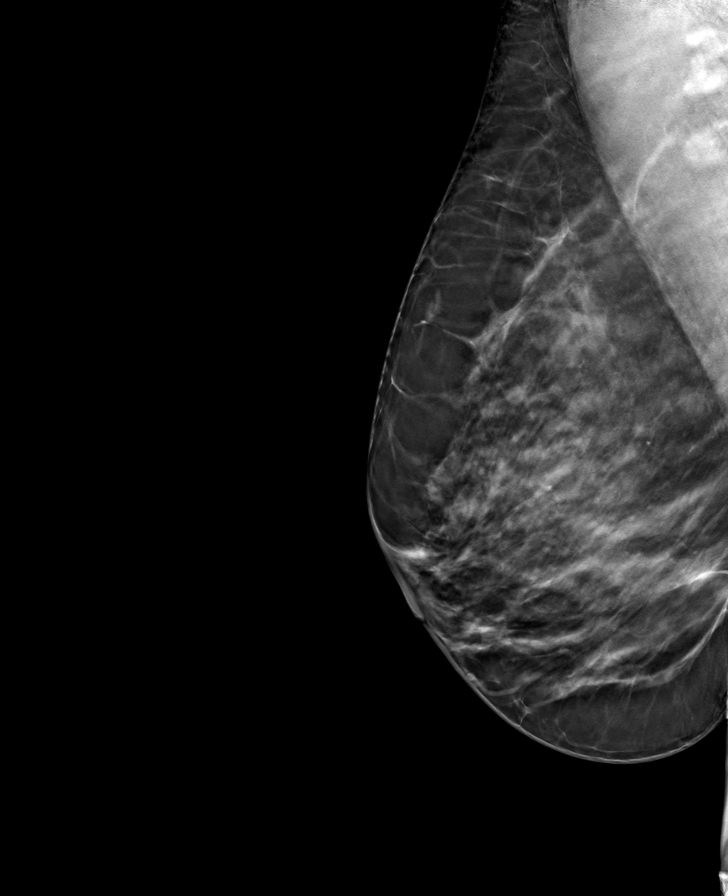

[L MLO tomo · tomo slice 41/81.0]
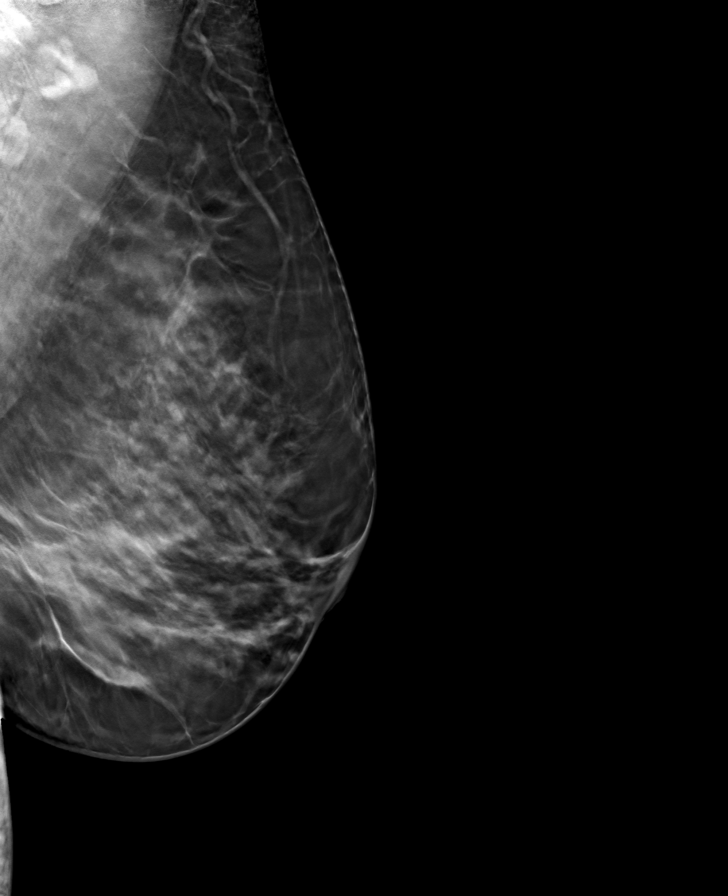

[R CC tomo · tomo slice 34/67.0]
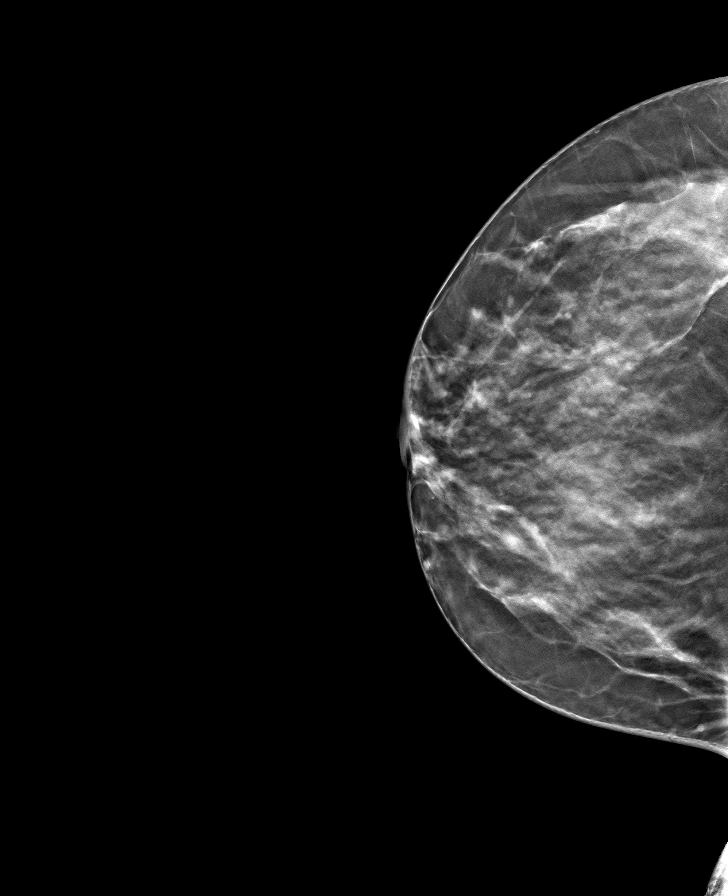

[L CC tomo · tomo slice 35/70.0]
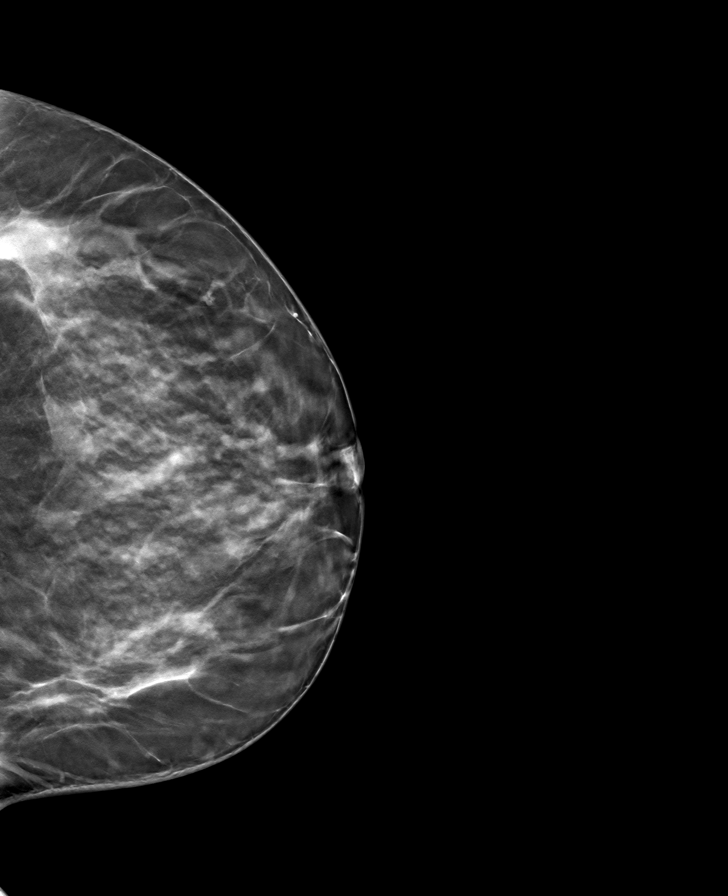

[8 of 24 positions shown; findings below may reference images not displayed]

ACR Breast Density Category c: The breast tissue is heterogeneously
dense, which may obscure small masses.
FINDINGS: There are no findings suspicious for malignancy. The images were
evaluated with computer-aided detection.
IMPRESSION: No mammographic evidence of malignancy. A result letter of this
screening mammogram will be mailed directly to the patient.

RECOMMENDATION:
Screening mammogram in one year. (Code:JF-W-WVL)

BI-RADS CATEGORY  1: Negative.

## 2021-12-17 ENCOUNTER — Other Ambulatory Visit: Payer: Self-pay | Admitting: Cardiology

## 2022-01-07 DIAGNOSIS — R079 Chest pain, unspecified: Secondary | ICD-10-CM | POA: Diagnosis not present

## 2022-01-07 DIAGNOSIS — R0789 Other chest pain: Secondary | ICD-10-CM | POA: Diagnosis not present

## 2022-01-08 ENCOUNTER — Encounter: Payer: Self-pay | Admitting: Cardiology

## 2022-01-08 ENCOUNTER — Telehealth: Payer: Self-pay

## 2022-01-08 ENCOUNTER — Encounter: Payer: Self-pay | Admitting: Emergency Medicine

## 2022-01-08 ENCOUNTER — Other Ambulatory Visit: Payer: Self-pay

## 2022-01-08 ENCOUNTER — Emergency Department: Payer: Medicaid Other

## 2022-01-08 ENCOUNTER — Emergency Department
Admission: EM | Admit: 2022-01-08 | Discharge: 2022-01-08 | Disposition: A | Discharge status: Home / Self Care | Payer: Medicaid Other | Attending: Emergency Medicine | Admitting: Emergency Medicine

## 2022-01-08 DIAGNOSIS — R079 Chest pain, unspecified: Secondary | ICD-10-CM | POA: Diagnosis not present

## 2022-01-08 DIAGNOSIS — F419 Anxiety disorder, unspecified: Secondary | ICD-10-CM | POA: Diagnosis not present

## 2022-01-08 DIAGNOSIS — F439 Reaction to severe stress, unspecified: Secondary | ICD-10-CM | POA: Insufficient documentation

## 2022-01-08 DIAGNOSIS — R0789 Other chest pain: Secondary | ICD-10-CM | POA: Diagnosis not present

## 2022-01-08 DIAGNOSIS — E039 Hypothyroidism, unspecified: Secondary | ICD-10-CM | POA: Diagnosis not present

## 2022-01-08 DIAGNOSIS — Z79899 Other long term (current) drug therapy: Secondary | ICD-10-CM | POA: Diagnosis not present

## 2022-01-08 LAB — CBC WITH DIFFERENTIAL/PLATELET
Abs Immature Granulocytes: 0.03 10*3/uL (ref 0.00–0.07)
Basophils Absolute: 0 10*3/uL (ref 0.0–0.1)
Basophils Relative: 1 %
Eosinophils Absolute: 0.1 10*3/uL (ref 0.0–0.5)
Eosinophils Relative: 1 %
HCT: 40.1 % (ref 36.0–46.0)
Hemoglobin: 12.3 g/dL (ref 12.0–15.0)
Immature Granulocytes: 0 %
Lymphocytes Relative: 38 %
Lymphs Abs: 2.9 10*3/uL (ref 0.7–4.0)
MCH: 22.9 pg — ABNORMAL LOW (ref 26.0–34.0)
MCHC: 30.7 g/dL (ref 30.0–36.0)
MCV: 74.5 fL — ABNORMAL LOW (ref 80.0–100.0)
Monocytes Absolute: 0.5 10*3/uL (ref 0.1–1.0)
Monocytes Relative: 6 %
Neutro Abs: 4.2 10*3/uL (ref 1.7–7.7)
Neutrophils Relative %: 54 %
Platelets: 169 10*3/uL (ref 150–400)
RBC: 5.38 MIL/uL — ABNORMAL HIGH (ref 3.87–5.11)
RDW: 14.6 % (ref 11.5–15.5)
WBC: 7.7 10*3/uL (ref 4.0–10.5)
nRBC: 0 % (ref 0.0–0.2)

## 2022-01-08 LAB — BASIC METABOLIC PANEL
Anion gap: 6 (ref 5–15)
BUN: 9 mg/dL (ref 6–20)
CO2: 25 mmol/L (ref 22–32)
Calcium: 8.6 mg/dL — ABNORMAL LOW (ref 8.9–10.3)
Chloride: 108 mmol/L (ref 98–111)
Creatinine, Ser: 0.79 mg/dL (ref 0.44–1.00)
GFR, Estimated: >60 mL/min (ref >60)
Glucose, Bld: 83 mg/dL (ref 70–99)
Potassium: 3.7 mmol/L (ref 3.5–5.1)
Sodium: 139 mmol/L (ref 135–145)

## 2022-01-08 LAB — TROPONIN I (HIGH SENSITIVITY): Troponin I (High Sensitivity): 2 ng/L (ref <18)

## 2022-01-08 LAB — TSH: TSH: 2.951 u[IU]/mL (ref 0.350–4.500)

## 2022-01-08 LAB — POC URINE PREG, ED: Preg Test, Ur: NEGATIVE

## 2022-01-08 MED ORDER — HYDROXYZINE HCL 25 MG PO TABS: 25.0000 mg | ORAL_TABLET | Freq: Three times a day (TID) | ORAL | 0 refills | Status: AC | PRN

## 2022-01-08 NOTE — Discharge Instructions (Addendum)
Please call your primary care doctor to discuss your anxiety further.  I have started you on medication to help with anxiety as needed but is important that you get on something long-term to help prevent the spikes in anxiety.  If you develop worsening symptoms or thoughts of wanting to harm yourself please return to the ER immediately for repeat evaluation  For your chest discomfort your troponin was negative for heart attack however if you develop recurrent symptoms that are different or develop any swelling in the leg or pain with breathing that you to return to the ER for repeat work-up.  Otherwise you can call your cardiologist to make a follow-up appointment.

## 2022-01-08 NOTE — ED Provider Notes (Addendum)
[facility] Provider Note    Event Date/Time   First MD Initiated Contact with Patient 01/08/22 1829     (approximate)   History   Chest Pain   HPI  Katelyn Soto is a 41 y.o. female with prior history of anxiety, depression previously on Zoloft but did not work who came in with chest tightness.  Patient reports yesterday she got a phone call with some information that made her very stressed out.  She is not willing to share with me what the phone call was about but she denies it being related to her health.  She reports that had to do with her boyfriend.  She states that after that phone call she then developed some chest discomfort with a little bit of shortness of breath and feeling fairly anxious and upset.  She reports that the symptoms have been going on intermittently since yesterday last for a few minutes and then will go away.  She does report having chest pain previously from anxiety and stress but she wanted to make sure that there was nothing else going on.  She currently denies any chest pain or shortness of breath denies any risk factors for PE.  She is not currently on any contraception.  She denies any abdominal pain.  She reports previously being on Zoloft but not tolerating it so has not been on any other medications since then.  She denies any SI.  Denies any history of stents or heart attacks.  I reviewed patient's cardiology note from [facility] she is on a beta-blocker for palpitations.  She is also on medication for hypothyroidism.     Physical Exam   Triage Vital Signs: ED Triage Vitals  Enc Vitals Group     BP 01/08/22 1545 123/81     Pulse Rate 01/08/22 1545 75     Resp 01/08/22 1545 16     Temp 01/08/22 1545 98.3 F (36.8 C)     Temp Source 01/08/22 1545 Oral     SpO2 01/08/22 1545 100 %     Weight 01/08/22 1556 185 lb (83.9 kg)     Height 01/08/22 1556 5\' 4"  (1.626 m)     Head Circumference --      Peak Flow --      Pain Score  01/08/22 1556 8     Pain Loc --      Pain Edu? --      Excl. in GC? --     Most recent vital signs: Vitals:   01/08/22 1545  BP: 123/81  Pulse: 75  Resp: 16  Temp: 98.3 F (36.8 C)  SpO2: 100%     General: Awake, no distress.  CV:  Good peripheral perfusion.  Resp:  Normal effort. Clear lungs no wheezing  Abd:  No distention.  Other:  No swelling the legs.  No calf tenderness.  Good distal pulses.  Abdomen is soft and nontender.  No rash noted on the chest wall.  No tenderness with palpation along the chest wall   ED Results / Procedures / Treatments   Labs (all labs ordered are listed, but only abnormal results are displayed) Labs Reviewed  CBC WITH DIFFERENTIAL/PLATELET - Abnormal; Notable for the following components:      Result Value   RBC 5.38 (*)    MCV 74.5 (*)    MCH 22.9 (*)    All other components within normal limits  BASIC METABOLIC PANEL - Abnormal; Notable for  the following components:   Calcium 8.6 (*)    All other components within normal limits  TSH  TROPONIN I (HIGH SENSITIVITY)  TROPONIN I (HIGH SENSITIVITY)     EKG  My interpretation of EKG:  Normal sinus rate of 72 without any ST elevation, T wave inversion lead V III and lead III, normal intervals  RADIOLOGY I have reviewed the xray personally and and interpreted no evidence of any pneumonia   PROCEDURES:  Critical Care performed: No  Procedures   MEDICATIONS ORDERED IN ED: Medications - No data to display   IMPRESSION / MDM / ASSESSMENT AND PLAN / ED COURSE  I reviewed the triage vital signs and the nursing notes.   Patient's presentation is most consistent with acute presentation with potential threat to life or bodily function.   Differential includes ACS, pneumonia, pneumothorax, anxiety, stress.  EKG did show some nonspecific T wave inversions so troponin was ordered to evaluate for ACS.  Patient is PERC negative unlikely pulmonary embolism and she reports complete  resolution of symptoms.  Troponin is negative.  CBC shows stable hemoglobin.  Thyroid TSH is in the normal range.  BMP is normal  Discussed with patient the need for repeat troponin but given the pain did start yesterday patient has opted to decline which I think is reasonable given this is been off and on since yesterday and similar quality today.  Patient's heart score is 2 and she has a cardiologist that she can follow-up with outpatient.  We discussed her anxiety and her depression at length and patient does report struggling with this for years but not currently on any medication for it.  She is going to talk further with her primary care doctor to try other options that would help better with long-term coverage of her anxiety and we discussed using some hydroxyzine for as needed attacks until she can get this follow-up.  She expressed understanding and felt comfortable with this plan.  She denies being a danger to herself and feels comfortable with discharge home.   7:25 PM I did discuss with patient that we did not do further work-up for blood clots given she was PERC negative but if she did have shortness of breath pain with breathing swelling the leg or any other concerns that she should return to the ER for repeat evaluation or if the chest pain is changing quality or character that she would need to be reevaluated.  However at this time on repeat evaluation she remains without any symptoms.  She has been in the ER for over 5 hours with stable vital signs.  I discussed the provisional nature of ED diagnosis, the treatment so far, the ongoing plan of care, follow up appointments and return precautions with the patient and any family or support people present. They expressed understanding and agreed with the plan, discharged home.        FINAL CLINICAL IMPRESSION(S) / ED DIAGNOSES   Final diagnoses:  Atypical chest pain  Anxiety     Rx / DC Orders   ED Discharge Orders           Ordered    hydrOXYzine (ATARAX) 25 MG tablet  3 times daily PRN        01/08/22 1924             Note:  This document was prepared using Dragon voice recognition software and may include unintentional dictation errors.   Concha Se, MD 01/08/22 1925  Concha Se, MD 01/08/22 1926    Concha Se, MD 01/08/22 [phone]

## 2022-01-08 NOTE — ED Notes (Signed)
Pt refused DC vitals. Pt DC papers and medication were reviewed with pt and SO. Pt gives verbal consent to DC

## 2022-01-08 NOTE — ED Provider Triage Note (Signed)
  Emergency Medicine Provider Triage Evaluation Note  Katelyn Soto , a 41 y.o.female,  was evaluated in triage.  Pt complains of left-sided chest pain that started yesterday and has been on and off.  Endorses some shortness of breath as well.  No prior cardiac history.    Review of Systems  Positive: Chest pain Negative: Denies fever, abdominal pain, vomiting  Physical Exam   Vitals:   01/08/22 1545  BP: 123/81  Pulse: 75  Resp: 16  Temp: 98.3 F (36.8 C)  SpO2: 100%   Gen:   Awake, no distress   Resp:  Normal effort  MSK:   Moves extremities without difficulty  Other:    Medical Decision Making  Given the patient's initial medical screening exam, the following diagnostic evaluation has been ordered. The patient will be placed in the appropriate treatment space, once one is available, to complete the evaluation and treatment. I have discussed the plan of care with the patient and I have advised the patient that an ED physician or mid-level practitioner will reevaluate their condition after the test results have been received, as the results may give them additional insight into the type of treatment they may need.    Diagnostics: Labs, EKG, CXR  Treatments: none immediately   Varney Daily, [place] 01/08/22 1550

## 2022-01-08 NOTE — Telephone Encounter (Signed)
Called patient to get more information regarding her chest pain that she has been having. Patient states that she has been going through a stressful time in her life and she recently has started having chest pain in her left upper chest. She started having chest pain around 2 pm yesterday and also at 4:30 pm. Her friend who is a paramedic did and EKG and told her that it showed that nothing was wrong. He also recommended that she go to the ED to have blood work done. At that time she took an aspirin 81 mg and the pain went away. She started having chest pain again at 11:30 pm and took a metoprolol and went to sleep. She has also had chest pain today. Based on the symptoms she has relayed to me I am recommending that she go to the ED to be evaluated because of her chest pain. Patient agreed.

## 2022-01-08 NOTE — ED Triage Notes (Signed)
Pt via POV from home. Pt c/o L sided chest pain started yesterday after she heard some stressful news with SOB. States that it has gotten better since then but is still there. Denies hx of anxiety. Pt is A&Ox4 and NAD

## 2022-01-09 ENCOUNTER — Telehealth: Payer: Self-pay

## 2022-01-09 NOTE — Telephone Encounter (Signed)
Transition Care Management Follow-up Telephone Call Date of discharge and from where: 01/08/2022 from [facility] How have you been since you were released from the hospital? Patient stated that she is feeling better. Patient did not have any questions or concerns at this time.  Any questions or concerns? No  Items Reviewed: Did the pt receive and understand the discharge instructions provided? Yes  Medications obtained and verified? Yes  Other? No  Any new allergies since your discharge? No  Dietary orders reviewed? No Do you have support at home? Yes   Functional Questionnaire: (I = Independent and D = Dependent) ADLs: I  Bathing/Dressing- I  Meal Prep- I  Eating- I  Maintaining continence- I  Transferring/Ambulation- I  Managing Meds- I   Follow up appointments reviewed:  PCP Hospital f/u appt confirmed? Yes  Scheduled to see Establish with [facility] on 02/19/2022 @ 1:40pm. Specialist Hospital f/u appt confirmed? Yes  Scheduled to see [facility] on 02/04/2022 @ 3:00pm. Are transportation arrangements needed? No  If their condition worsens, is the pt aware to call PCP or go to the Emergency Dept.? Yes Was the patient provided with contact information for the PCP's office or ED? Yes Was to pt encouraged to call back with questions or concerns? Yes

## 2022-02-04 ENCOUNTER — Ambulatory Visit: Payer: Medicaid Other | Admitting: Internal Medicine

## 2022-02-04 ENCOUNTER — Encounter: Payer: Self-pay | Admitting: Internal Medicine

## 2022-02-04 VITALS — BP 120/78 | HR 65 | Ht 64.0 in | Wt 188.0 lb

## 2022-02-04 DIAGNOSIS — E0590 Thyrotoxicosis, unspecified without thyrotoxic crisis or storm: Secondary | ICD-10-CM | POA: Diagnosis not present

## 2022-02-04 DIAGNOSIS — E0500 Thyrotoxicosis with diffuse goiter without thyrotoxic crisis or storm: Secondary | ICD-10-CM | POA: Diagnosis not present

## 2022-02-04 MED ORDER — METHIMAZOLE 10 MG PO TABS: 10.0000 mg | ORAL_TABLET | Freq: Every day | ORAL | 3 refills | Status: DC

## 2022-02-04 NOTE — Progress Notes (Signed)
Name: Katelyn Soto  MRN/ DOB: [id number], 12/12/1980    Age/ Sex: 41 y.o., female     PCP: Pcp, No   Reason for Endocrinology Evaluation: Hyperthyroidism     Initial Endocrinology Clinic Visit: 03/21/2020    PATIENT IDENTIFIER: Katelyn Soto is a 41 y.o., female with a past medical history of anemia and hyperthyroidism.  She has followed with [facility] since 03/21/2020 for consultative assistance with management of her hyperthyroidism  HISTORICAL SUMMARY:    She has been diagnosed with hyperthyroidism in 01/2020 with a suppressed TSH < 0.005 uIU/mL with an elevated T4 and FT3 at 7.77 ng/dL and 66.4 pg/mL respectively.    She had COVID infection 2 weeks prior to this. She was having fatigue, weight loss and palpitations, was having constipation and dysphagia.       Pt was started on Methimazole 03/14/2020 and Metoprolol.  TRAB elevated at 29.20 IU/L   No Fh of thyroid disease  SUBJECTIVE:    Today (02/04/2022):  Katelyn Soto is here for hyperthyroidism.    Weight has been fluctuating  Denies constipation or diarrhea  Denies local neck swelling Denies palpitations  Denies burning  of the eyes, sees ophthalmology    Methimazole 10 mg, daily      HOME ENDOCRINE MEDICATIONS: Methimazole 10 mg, 1 tab daily  Toprol XL 25 mg daily      HISTORY:  Past Medical History:  Past Medical History:  Diagnosis Date   Anemia    Cardiac murmur 02/10/2020   Chest tightness 11/14/2017   Cigarette smoker 11/14/2017   Graves disease 07/04/2020   History of COVID-19 02/10/2020   Hyperthyroidism 03/10/2020   Iron deficiency anemia 01/14/2016   Obesity (BMI 30.0-34.9) 11/15/2020   Palpitations 02/10/2020   Shortness of breath 02/10/2020   Tachycardia 02/10/2020   Vitamin D deficiency 01/14/2016   Past Surgical History:  Past Surgical History:  Procedure Laterality Date   ESSURE TUBAL LIGATION     Social History:  reports that she has never smoked. She has never  used smokeless tobacco. She reports current alcohol use. She reports that she does not use drugs. Family History:  Family History  Problem Relation Age of Onset   Hypertension Mother    Diabetes Mother    Heart disease Mother    Heart failure Mother    Cancer Father        throat     HOME MEDICATIONS: Allergies as of 02/04/2022   No Known Allergies      Medication List        Accurate as of February 04, 2022  3:19 PM. If you have any questions, ask your nurse or doctor.          folic acid 1 MG tablet Commonly known as: FOLVITE Take 1 mg by mouth daily.   ibuprofen 800 MG tablet Commonly known as: ADVIL Take 1 tablet (800 mg total) by mouth 3 (three) times daily with meals as needed for moderate pain, mild pain or headache.   methimazole 10 MG tablet Commonly known as: TAPAZOLE Take 1 tablet (10 mg total) by mouth daily.   metoprolol succinate 25 MG 24 hr tablet Commonly known as: TOPROL-XL TAKE 1 TABLET BY MOUTH EVERY DAY   tiZANidine 2 MG tablet Commonly known as: ZANAFLEX Take 1 tablet (2 mg total) by mouth every 8 (eight) hours as needed for muscle spasms.          OBJECTIVE:   PHYSICAL  EXAM: VS: BP 120/78 (BP Location: Left Arm, Patient Position: Sitting, Cuff Size: Large)   Pulse 65   Ht 5\' 4"  (1.626 m)   Wt 188 lb (85.3 kg)   SpO2 99%   BMI 32.27 kg/m    EXAM: General: Pt appears well and is in NAD  Neck: General: Supple without adenopathy. Thyroid: Thyroid gland is prominent.   No goiter or nodules appreciated.   Lungs: Clear with good BS bilat with no rales, rhonchi, or wheezes  Heart: Auscultation: RRR.  Abdomen: Normoactive bowel sounds, soft, nontender, without masses or organomegaly palpable  Extremities:  BL LE: No pretibial edema normal ROM and strength.  Mental Status: Judgment, insight: Intact Orientation: Oriented to time, place, and person Mood and affect: No depression, anxiety, or agitation     DATA REVIEWED:   Latest Reference Range & Units Most Recent  TSH 0.350 - 4.500 uIU/mL 2.951 01/08/22 16:00     Latest Reference Range & Units 01/08/22 16:00  WBC 4.0 - 10.5 K/uL 7.7  RBC 3.87 - 5.11 MIL/uL 5.38 (H)  Hemoglobin 12.0 - 15.0 g/dL 01/10/22  HCT 41.5 - 83.0 % 40.1  MCV 80.0 - 100.0 fL 74.5 (L)  MCH 26.0 - 34.0 pg 22.9 (L)  MCHC 30.0 - 36.0 g/dL 94.0  RDW 76.8 - 08.8 % 14.6  Platelets 150 - 400 K/uL 169  nRBC 0.0 - 0.2 % 0.0  Neutrophils % 54  Lymphocytes % 38  Monocytes Relative % 6  Eosinophil % 1  Basophil % 1  Immature Granulocytes % 0  NEUT# 1.7 - 7.7 K/uL 4.2  Lymphocyte # 0.7 - 4.0 K/uL 2.9  Monocyte # 0.1 - 1.0 K/uL 0.5  Eosinophils Absolute 0.0 - 0.5 K/uL 0.1  Basophils Absolute 0.0 - 0.1 K/uL 0.0  Abs Immature Granulocytes 0.00 - 0.07 K/uL 0.03    Latest Reference Range & Units 01/08/22 16:00  Sodium 135 - 145 mmol/L 139  Potassium 3.5 - 5.1 mmol/L 3.7  Chloride 98 - 111 mmol/L 108  CO2 22 - 32 mmol/L 25  Glucose 70 - 99 mg/dL 83  BUN 6 - 20 mg/dL 9  Creatinine 01/10/22 - 3.15 mg/dL 9.45  Calcium 8.9 - 8.59 mg/dL 8.6 (L)  Anion gap 5 - 15  6  GFR, Estimated >60 mL/min >60  w       TRAB Latest Ref Range: <=2.00 IU/L 29.20 (H)    Thyroid ultrasound 02/24/2020 Nonspecific thyroid heterogeneity compatible with medical thyroid disease. No other significant finding or nodule by ultrasound.  ASSESSMENT / PLAN / RECOMMENDATIONS:   Hyperthyroidism secondary to Graves' Disease  - Pt is clinically  euthyroid  - No local neck symptoms  -We had  discussed the various treatment options for hyperthyroidism and Graves disease including ablation therapy with radioactive iodine versus antithyroid drug treatment versus surgical therapy.    -I carefully explained to the patient that one of the consequences of I-131 ablation treatment would likely be permanent hypothyroidism which would require long-term replacement therapy with LT4. - She is comfortable with methimazole at this  time  - TFT's normal , no change   Medications   STOP Metoprolol  Continue  methimazole 10 mg, 1 tablet daily   2. Graves' disease:   - No extrathyroidal manifestations of Graves' disease - Up to date on eye exam 2022    F/U in 6 months     Signed electronically by: 2023, MD  [facility]  [facility]  [address], [address] [place], [place] [address] Phone: [phone] FAX: [phone]      CC: Pcp, No No address on file Phone: None  Fax: None   Return to Endocrinology clinic as below: Future Appointments  Date Time Provider Department Center  02/19/2022  1:40 PM Ostwalt, Edmon Crape, PA-C [facility]

## 2022-02-04 NOTE — Patient Instructions (Signed)
STOP Metoprolol  Continue Methimazole 10mg  , 1 tablet daily

## 2022-02-19 ENCOUNTER — Encounter: Payer: Self-pay | Admitting: Physician Assistant

## 2022-02-19 ENCOUNTER — Ambulatory Visit: Payer: Medicaid Other | Admitting: Physician Assistant

## 2022-02-19 VITALS — BP 104/70 | HR 95 | Temp 98.9°F | Ht 64.0 in | Wt 186.0 lb

## 2022-02-19 DIAGNOSIS — E669 Obesity, unspecified: Secondary | ICD-10-CM | POA: Diagnosis not present

## 2022-02-19 DIAGNOSIS — Z7689 Persons encountering health services in other specified circumstances: Secondary | ICD-10-CM | POA: Insufficient documentation

## 2022-02-19 DIAGNOSIS — F419 Anxiety disorder, unspecified: Secondary | ICD-10-CM | POA: Diagnosis not present

## 2022-02-19 DIAGNOSIS — F17210 Nicotine dependence, cigarettes, uncomplicated: Secondary | ICD-10-CM | POA: Diagnosis not present

## 2022-02-19 DIAGNOSIS — Z1159 Encounter for screening for other viral diseases: Secondary | ICD-10-CM | POA: Diagnosis not present

## 2022-02-19 DIAGNOSIS — E0590 Thyrotoxicosis, unspecified without thyrotoxic crisis or storm: Secondary | ICD-10-CM | POA: Diagnosis not present

## 2022-02-19 DIAGNOSIS — Z8616 Personal history of COVID-19: Secondary | ICD-10-CM | POA: Diagnosis not present

## 2022-02-19 DIAGNOSIS — R0602 Shortness of breath: Secondary | ICD-10-CM

## 2022-02-19 DIAGNOSIS — Z114 Encounter for screening for human immunodeficiency virus [HIV]: Secondary | ICD-10-CM | POA: Diagnosis not present

## 2022-02-19 DIAGNOSIS — E0500 Thyrotoxicosis with diffuse goiter without thyrotoxic crisis or storm: Secondary | ICD-10-CM

## 2022-02-19 DIAGNOSIS — R011 Cardiac murmur, unspecified: Secondary | ICD-10-CM

## 2022-02-19 DIAGNOSIS — D508 Other iron deficiency anemias: Secondary | ICD-10-CM

## 2022-02-19 DIAGNOSIS — E559 Vitamin D deficiency, unspecified: Secondary | ICD-10-CM

## 2022-02-19 NOTE — Progress Notes (Signed)
I,Katelyn Soto,acting as a Education administrator for [employer], PA-C.,have documented all relevant documentation on the behalf of Katelyn Speak, PA-C,as directed by  [employer], PA-C while in the presence of [employer], PA-C.    New patient visit   Patient: Katelyn Soto   DOB: 05-09-1981   41 y.o. Female  MRN: [id number] Visit Date: 02/19/2022  Today's healthcare provider: [name], PA-C   Chief Complaint  Patient presents with   [name] is a 41 y.o. female who presents today as a new patient to establish care.   HPI   Hyperthyroidism/Graves dx p covid -19 Last visit 02/04/22 Q 6 mo  Current day Cigarette smoker  Cardiac  murmur Last visit With cardiology was in May of this year Q 6 mo.  Anxiety/Depression Was diagnosed by ObGyn and started to Zoloft  Currently she is not taking this medication    Past Medical History:  Diagnosis Date   Anemia    Cardiac murmur 02/10/2020   Chest tightness 11/14/2017   Cigarette smoker 11/14/2017   Graves disease 07/04/2020   History of COVID-19 02/10/2020   Hyperthyroidism 03/10/2020   Iron deficiency anemia 01/14/2016   Obesity (BMI 30.0-34.9) 11/15/2020   Palpitations 02/10/2020   Shortness of breath 02/10/2020   Tachycardia 02/10/2020   Vitamin D deficiency 01/14/2016   Past Surgical History:  Procedure Laterality Date   ESSURE TUBAL LIGATION     Family Status  Relation Name Status   Mother  Deceased   Father  Deceased   Family History  Problem Relation Age of Onset   Hypertension Mother    Diabetes Mother    Heart disease Mother    Heart failure Mother    Cancer Father        throat   Social History   Socioeconomic History   Marital status: Legally Separated    Spouse name: Not on file   Number of children: Not on file   Years of education: Not on file   Highest education level: Not on file  Occupational History   Not on file  Tobacco Use   Smoking status: Every  Day    Packs/day: 0.50    Types: Cigarettes   Smokeless tobacco: Never  Vaping Use   Vaping Use: Never used  Substance and Sexual Activity   Alcohol use: Yes    Comment: 2 glassess through the week and again on weekends.   Drug use: No   Sexual activity: Not on file  Other Topics Concern   Not on file  Social History Narrative   Not on file   Social Determinants of Health   Financial Resource Strain: Not on file  Food Insecurity: Not on file  Transportation Needs: Not on file  Physical Activity: Not on file  Stress: Not on file  Social Connections: Not on file   Outpatient Medications Prior to Visit  Medication Sig   folic acid (FOLVITE) 1 MG tablet Take 1 mg by mouth daily.   methimazole (TAPAZOLE) 10 MG tablet Take 1 tablet (10 mg total) by mouth daily.   tiZANidine (ZANAFLEX) 2 MG tablet Take 1 tablet (2 mg total) by mouth every 8 (eight) hours as needed for muscle spasms.   [DISCONTINUED] ibuprofen (ADVIL) 800 MG tablet Take 1 tablet (800 mg total) by mouth 3 (three) times daily with meals as needed for moderate pain, mild pain or headache. (Patient not taking: Reported on  02/19/2022)   No facility-administered medications prior to visit.   No Known Allergies  Immunization History  Administered Date(s) Administered   Moderna Sars-Covid-2 Vaccination 03/27/2020, 04/17/2020    Health Maintenance  Topic Date Due   HIV Screening  Never done   Hepatitis C Screening  Never done   TETANUS/TDAP  Never done   HEMOGLOBIN A1C  05/18/2020   COVID-19 Vaccine (2 - Moderna series) 06/12/2020   INFLUENZA VACCINE  Never done   PAP-Cervical Cytology Screening  04/04/2022   PAP SMEAR-Modifier  04/04/2022   HPV VACCINES  Aged Out    Patient Care Team: Pcp, No as PCP - General  Review of Systems  Constitutional:  Negative for chills, fatigue and fever.  HENT:  Negative for congestion, ear pain, rhinorrhea, sneezing and sore throat.   Eyes: Negative.  Negative for pain and  redness.  Respiratory:  Negative for cough, shortness of breath and wheezing.   Cardiovascular:  Negative for chest pain and leg swelling.  Gastrointestinal:  Negative for abdominal pain, blood in stool, constipation, diarrhea and nausea.  Endocrine: Negative for polydipsia and polyphagia.  Genitourinary: Negative.  Negative for dysuria, flank pain, hematuria, pelvic pain, vaginal bleeding and vaginal discharge.  Musculoskeletal:  Negative for arthralgias, back pain, gait problem and joint swelling.  Skin:  Negative for rash.  Neurological: Negative.  Negative for dizziness, tremors, seizures, weakness, light-headedness, numbness and headaches.  Hematological:  Negative for adenopathy.  Psychiatric/Behavioral: Negative.  Negative for behavioral problems, confusion and dysphoric mood. The patient is not nervous/anxious and is not hyperactive.        Objective    BP 104/70 (BP Location: Left Arm, Patient Position: Sitting, Cuff Size: Large)   Pulse 95   Temp 98.9 F (37.2 C) (Oral)   Ht 5\' 4"  (1.626 m)   Wt 186 lb (84.4 kg)   SpO2 100%   BMI 31.93 kg/m    Physical Exam Vitals reviewed.  Constitutional:      General: She is not in acute distress.    Appearance: Normal appearance. She is well-developed. She is not diaphoretic.  HENT:     Head: Normocephalic and atraumatic.     Nose: Nose normal.  Eyes:     General: No scleral icterus.       Right eye: No discharge.        Left eye: No discharge.     Extraocular Movements: Extraocular movements intact.     Conjunctiva/sclera: Conjunctivae normal.     Pupils: Pupils are equal, round, and reactive to light.  Neck:     Thyroid: No thyromegaly.  Cardiovascular:     Rate and Rhythm: Normal rate and regular rhythm.     Pulses: Normal pulses.     Heart sounds: Normal heart sounds. No murmur heard. Pulmonary:     Effort: Pulmonary effort is normal. No respiratory distress.     Breath sounds: Normal breath sounds. No wheezing,  rhonchi or rales.  Abdominal:     General: Abdomen is flat. Bowel sounds are normal.     Palpations: Abdomen is soft.  Musculoskeletal:        General: Normal range of motion.     Cervical back: Normal range of motion and neck supple.     Right lower leg: No edema.     Left lower leg: No edema.  Lymphadenopathy:     Cervical: No cervical adenopathy.  Skin:    General: Skin is warm and dry.  Findings: No rash.  Neurological:     General: No focal deficit present.     Mental Status: She is alert and oriented to person, place, and time. Mental status is at baseline.     Cranial Nerves: No cranial nerve deficit.     Motor: No weakness.     Gait: Gait normal.     Deep Tendon Reflexes: Reflexes normal.  Psychiatric:        Behavior: Behavior normal.        Thought Content: Thought content normal.        Judgment: Judgment normal.      Depression Screen    02/09/2020    9:05 AM  PHQ 2/9 Scores  PHQ - 2 Score 6  PHQ- 9 Score 21   No results found for any visits on 02/19/22.  Assessment & Plan     1. Hyperthyroidism Chronic and stable Continue current regimen Managed by Endocrinology Tsh and T4 ordered - CBC with Differential/Platelet - Comprehensive metabolic panel  2. Graves disease Chronic and stable Managed by Endocrinology - CBC with Differential/Platelet - Comprehensive metabolic panel  3. History of COVID-19  4. Vitamin D deficiency  - VITAMIN D 25 Hydroxy (Vit-D Deficiency, Fractures)  5. Other iron deficiency anemia  - CBC with Differential/Platelet  6. Cigarette smoker Advised smoking cessation Will address at the next visit  Cardiac murmur  - CBC with Differential/Platelet - Comprehensive metabolic panel - Lipid panel - VITAMIN D 25 Hydroxy (Vit-D Deficiency, Fractures)  Anxiety  - CBC with Differential/Platelet - Comprehensive metabolic panel - Lipid panel - VITAMIN D 25 Hydroxy (Vit-D Deficiency, Fractures) - Ambulatory referral  to Psychiatry - Ambulatory referral to Psychology  Obesity (BMI 30-39.9)  - CBC with Differential/Platelet - Comprehensive metabolic panel - Lipid panel - VITAMIN D 25 Hydroxy (Vit-D Deficiency, Fractures) - Hemoglobin A1c  Need for hepatitis C screening test  - Hepatitis C antibody  Encounter for screening for HIV  - HIV Antibody (routine testing w rflx)  CPE in mo Planned to proceed with PAP smear through ObGyn   The patient was advised to call back or seek an in-person evaluation if the symptoms worsen or if the condition fails to improve as anticipated.  I discussed the assessment and treatment plan with the patient. The patient was provided an opportunity to ask questions and all were answered. The patient agreed with the plan and demonstrated an understanding of the instructions.  The entirety of the information documented in the History of Present Illness, Review of Systems and Physical Exam were personally obtained by me. Portions of this information were initially documented by the CMA and reviewed by me for thoroughness and accuracy.  Portions of this note were created using dictation software and may contain typographical errors.       Total encounter time more than 45 minutes  Greater than 50% was spent in counseling and coordination of care with the patient   Cherlynn Polo  [facility] [phone] (phone) [phone] (fax)  [facility]

## 2022-02-20 LAB — COMPREHENSIVE METABOLIC PANEL
ALT: 16 IU/L (ref 0–32)
AST: 15 IU/L (ref 0–40)
Albumin/Globulin Ratio: 1.7 (ref 1.2–2.2)
Albumin: 4.5 g/dL (ref 3.9–4.9)
Alkaline Phosphatase: 55 IU/L (ref 44–121)
BUN/Creatinine Ratio: 8 — ABNORMAL LOW (ref 9–23)
BUN: 8 mg/dL (ref 6–24)
Bilirubin Total: 0.2 mg/dL (ref 0.0–1.2)
CO2: 25 mmol/L (ref 20–29)
Calcium: 9.3 mg/dL (ref 8.7–10.2)
Chloride: 104 mmol/L (ref 96–106)
Creatinine, Ser: 0.97 mg/dL (ref 0.57–1.00)
Globulin, Total: 2.7 g/dL (ref 1.5–4.5)
Glucose: 74 mg/dL (ref 70–99)
Potassium: 3.9 mmol/L (ref 3.5–5.2)
Sodium: 140 mmol/L (ref 134–144)
Total Protein: 7.2 g/dL (ref 6.0–8.5)
eGFR: 74 mL/min/{1.73_m2} (ref >59)

## 2022-02-20 LAB — CBC WITH DIFFERENTIAL/PLATELET
Basophils Absolute: 0 10*3/uL (ref 0.0–0.2)
Basos: 0 %
EOS (ABSOLUTE): 0.1 10*3/uL (ref 0.0–0.4)
Eos: 2 %
Hematocrit: 41.5 % (ref 34.0–46.6)
Hemoglobin: 12.9 g/dL (ref 11.1–15.9)
Immature Grans (Abs): 0 10*3/uL (ref 0.0–0.1)
Immature Granulocytes: 0 %
Lymphocytes Absolute: 2.6 10*3/uL (ref 0.7–3.1)
Lymphs: 37 %
MCH: 23 pg — ABNORMAL LOW (ref 26.6–33.0)
MCHC: 31.1 g/dL — ABNORMAL LOW (ref 31.5–35.7)
MCV: 74 fL — ABNORMAL LOW (ref 79–97)
Monocytes Absolute: 0.4 10*3/uL (ref 0.1–0.9)
Monocytes: 6 %
Neutrophils Absolute: 3.9 10*3/uL (ref 1.4–7.0)
Neutrophils: 55 %
Platelets: 200 10*3/uL (ref 150–450)
RBC: 5.61 x10E6/uL — ABNORMAL HIGH (ref 3.77–5.28)
RDW: 14.3 % (ref 11.7–15.4)
WBC: 7.1 10*3/uL (ref 3.4–10.8)

## 2022-02-20 LAB — LIPID PANEL
Chol/HDL Ratio: 3.6 ratio (ref 0.0–4.4)
Cholesterol, Total: 166 mg/dL (ref 100–199)
HDL: 46 mg/dL (ref >39)
LDL Chol Calc (NIH): 97 mg/dL (ref 0–99)
Triglycerides: 127 mg/dL (ref 0–149)
VLDL Cholesterol Cal: 23 mg/dL (ref 5–40)

## 2022-02-20 LAB — VITAMIN D 25 HYDROXY (VIT D DEFICIENCY, FRACTURES): Vit D, 25-Hydroxy: 23.3 ng/mL — ABNORMAL LOW (ref 30.0–100.0)

## 2022-02-20 LAB — HEMOGLOBIN A1C
Est. average glucose Bld gHb Est-mCnc: 108 mg/dL
Hgb A1c MFr Bld: 5.4 % (ref 4.8–5.6)

## 2022-04-02 ENCOUNTER — Telehealth: Payer: Self-pay

## 2022-04-02 NOTE — Telephone Encounter (Signed)
Copied from [place] [id number]. Topic: General - Inquiry >> Apr 02, 2022 12:45 PM Marcellus Scott wrote: Reason for CRM: Claiborne Billings from [facility] stated she received a referral, but they don't accept Medicaid or Medicare.  Please advise.

## 2022-05-03 ENCOUNTER — Emergency Department (HOSPITAL_COMMUNITY)
Admission: EM | Admit: 2022-05-03 | Discharge: 2022-05-04 | Discharge status: Against Medical Advice | Payer: Medicaid Other | Attending: Emergency Medicine | Admitting: Emergency Medicine

## 2022-05-03 ENCOUNTER — Emergency Department (HOSPITAL_COMMUNITY): Payer: Medicaid Other

## 2022-05-03 ENCOUNTER — Other Ambulatory Visit: Payer: Self-pay

## 2022-05-03 ENCOUNTER — Encounter (HOSPITAL_COMMUNITY): Payer: Self-pay

## 2022-05-03 DIAGNOSIS — Z5321 Procedure and treatment not carried out due to patient leaving prior to being seen by health care provider: Secondary | ICD-10-CM | POA: Insufficient documentation

## 2022-05-03 DIAGNOSIS — R0789 Other chest pain: Secondary | ICD-10-CM | POA: Insufficient documentation

## 2022-05-03 DIAGNOSIS — R079 Chest pain, unspecified: Secondary | ICD-10-CM | POA: Diagnosis not present

## 2022-05-03 DIAGNOSIS — R0602 Shortness of breath: Secondary | ICD-10-CM | POA: Diagnosis not present

## 2022-05-03 LAB — BASIC METABOLIC PANEL
Anion gap: 8 (ref 5–15)
BUN: 10 mg/dL (ref 6–20)
CO2: 25 mmol/L (ref 22–32)
Calcium: 9.1 mg/dL (ref 8.9–10.3)
Chloride: 106 mmol/L (ref 98–111)
Creatinine, Ser: 0.84 mg/dL (ref 0.44–1.00)
GFR, Estimated: >60 mL/min (ref >60)
Glucose, Bld: 82 mg/dL (ref 70–99)
Potassium: 4.2 mmol/L (ref 3.5–5.1)
Sodium: 139 mmol/L (ref 135–145)

## 2022-05-03 LAB — CBC
HCT: 42.1 % (ref 36.0–46.0)
Hemoglobin: 13 g/dL (ref 12.0–15.0)
MCH: 23.4 pg — ABNORMAL LOW (ref 26.0–34.0)
MCHC: 30.9 g/dL (ref 30.0–36.0)
MCV: 75.7 fL — ABNORMAL LOW (ref 80.0–100.0)
Platelets: 193 10*3/uL (ref 150–400)
RBC: 5.56 MIL/uL — ABNORMAL HIGH (ref 3.87–5.11)
RDW: 15 % (ref 11.5–15.5)
WBC: 7.2 10*3/uL (ref 4.0–10.5)
nRBC: 0 % (ref 0.0–0.2)

## 2022-05-03 LAB — TROPONIN I (HIGH SENSITIVITY)
Troponin I (High Sensitivity): 3 ng/L (ref <18)
Troponin I (High Sensitivity): 3 ng/L (ref <18)

## 2022-05-03 LAB — PREGNANCY, URINE: Preg Test, Ur: NEGATIVE

## 2022-05-03 NOTE — ED Triage Notes (Signed)
Patient c/o left chest pain that radiates into the left shoulder and having SOB.

## 2022-05-03 NOTE — ED Provider Triage Note (Signed)
Emergency Medicine Provider Triage Evaluation Note  Katelyn Soto , a 41 y.o. female  was evaluated in triage.  Pt complains of chest discomfort and shortness of breath that has been going on since about 3pm today.  Denies nausea, vomiting, syncope/near syncope, weakness, numbness.  Pain is now radiating into her left shoulder.  Patient states she has had pain like this before and has history of anxiety.   Review of Systems  Positive: See above Negative: See above  Physical Exam  BP 125/75 (BP Location: Right Arm)   Pulse 79   Temp 98.9 F (37.2 C) (Oral)   Resp 19   Ht 5\' 4"  (1.626 m)   Wt 83.9 kg   LMP 04/19/2022 (Approximate)   SpO2 100%   BMI 31.76 kg/m  Gen:   Awake, no distress   Resp:  Normal effort  MSK:   Moves extremities without difficulty  Other:  Lungs clear to auscultation bilaterally, heart rate/rhythm normal with normal heart sounds  Medical Decision Making  Medically screening exam initiated at 6:30 PM.  Appropriate orders placed.  Leslyn S Wohler was informed that the remainder of the evaluation will be completed by another provider, this initial triage assessment does not replace that evaluation, and the importance of remaining in the ED until their evaluation is complete.     04/21/2022 R, Melton Alar 05/03/22 [phone]

## 2022-05-22 ENCOUNTER — Ambulatory Visit: Payer: Medicaid Other | Admitting: Physician Assistant

## 2022-05-24 NOTE — Progress Notes (Deleted)
      Established patient visit   Patient: Katelyn Soto   DOB: 1980-11-14   41 y.o. Female  MRN: [id number] Visit Date: 05/29/2022  Today's healthcare provider: Debera Lat, PA-C   No chief complaint on file.  Subjective    HPI  ***  Medications: Outpatient Medications Prior to Visit  Medication Sig   folic acid (FOLVITE) 1 MG tablet Take 1 mg by mouth daily.   methimazole (TAPAZOLE) 10 MG tablet Take 1 tablet (10 mg total) by mouth daily.   tiZANidine (ZANAFLEX) 2 MG tablet Take 1 tablet (2 mg total) by mouth every 8 (eight) hours as needed for muscle spasms.   No facility-administered medications prior to visit.    Review of Systems  {Labs  Heme  Chem  Endocrine  Serology  Results Review (optional):23779}   Objective    LMP 04/19/2022 (Approximate)  {Show previous vital signs (optional):23777}  Physical Exam  ***  No results found for any visits on 05/29/22.  Assessment & Plan     ***

## 2022-05-28 ENCOUNTER — Telehealth: Payer: Self-pay

## 2022-05-29 ENCOUNTER — Ambulatory Visit: Payer: Medicaid Other | Admitting: Physician Assistant

## 2022-06-06 ENCOUNTER — Other Ambulatory Visit: Payer: Self-pay

## 2022-06-06 ENCOUNTER — Telehealth: Payer: Self-pay

## 2022-06-06 DIAGNOSIS — E0590 Thyrotoxicosis, unspecified without thyrotoxic crisis or storm: Secondary | ICD-10-CM

## 2022-06-06 DIAGNOSIS — E0500 Thyrotoxicosis with diffuse goiter without thyrotoxic crisis or storm: Secondary | ICD-10-CM

## 2022-06-06 NOTE — Telephone Encounter (Signed)
Patient states that she has some swelling in her neck and would like to get labs done before her March appointment. If labs can be order at [facility]

## 2022-06-06 NOTE — Telephone Encounter (Signed)
Left detailed vm letting patient know labs have been ordered and release to [employer]

## 2022-06-07 DIAGNOSIS — E0590 Thyrotoxicosis, unspecified without thyrotoxic crisis or storm: Secondary | ICD-10-CM | POA: Diagnosis not present

## 2022-06-08 LAB — TSH: TSH: 6.84 u[IU]/mL — ABNORMAL HIGH (ref 0.450–4.500)

## 2022-06-08 LAB — T4, FREE: Free T4: 1.04 ng/dL (ref 0.82–1.77)

## 2022-06-10 ENCOUNTER — Other Ambulatory Visit: Payer: Self-pay | Admitting: Internal Medicine

## 2022-06-10 ENCOUNTER — Encounter: Payer: Self-pay | Admitting: Internal Medicine

## 2022-06-17 ENCOUNTER — Telehealth: Payer: Self-pay

## 2022-06-17 NOTE — Telephone Encounter (Signed)
Left message for pt to call the office back about a sooner appt.

## 2022-07-15 ENCOUNTER — Encounter: Payer: Self-pay | Admitting: Family

## 2022-07-15 ENCOUNTER — Ambulatory Visit: Payer: Managed Care, Other (non HMO) | Admitting: Family

## 2022-07-15 VITALS — BP 124/82 | HR 92 | Temp 97.6°F | Ht 64.0 in | Wt 183.2 lb

## 2022-07-15 DIAGNOSIS — Z6831 Body mass index (BMI) 31.0-31.9, adult: Secondary | ICD-10-CM

## 2022-07-15 DIAGNOSIS — F419 Anxiety disorder, unspecified: Secondary | ICD-10-CM

## 2022-07-15 DIAGNOSIS — F32A Depression, unspecified: Secondary | ICD-10-CM | POA: Insufficient documentation

## 2022-07-15 DIAGNOSIS — R011 Cardiac murmur, unspecified: Secondary | ICD-10-CM | POA: Diagnosis not present

## 2022-07-15 DIAGNOSIS — E66811 Obesity, class 1: Secondary | ICD-10-CM

## 2022-07-15 DIAGNOSIS — E538 Deficiency of other specified B group vitamins: Secondary | ICD-10-CM

## 2022-07-15 DIAGNOSIS — E559 Vitamin D deficiency, unspecified: Secondary | ICD-10-CM | POA: Diagnosis not present

## 2022-07-15 DIAGNOSIS — F411 Generalized anxiety disorder: Secondary | ICD-10-CM

## 2022-07-15 DIAGNOSIS — D508 Other iron deficiency anemias: Secondary | ICD-10-CM

## 2022-07-15 DIAGNOSIS — E0590 Thyrotoxicosis, unspecified without thyrotoxic crisis or storm: Secondary | ICD-10-CM

## 2022-07-15 DIAGNOSIS — F17210 Nicotine dependence, cigarettes, uncomplicated: Secondary | ICD-10-CM | POA: Diagnosis not present

## 2022-07-15 DIAGNOSIS — E6609 Other obesity due to excess calories: Secondary | ICD-10-CM | POA: Diagnosis not present

## 2022-07-15 HISTORY — DX: Anxiety disorder, unspecified: F41.9

## 2022-07-15 HISTORY — DX: Obesity, class 1: E66.811

## 2022-07-15 HISTORY — DX: Other obesity due to excess calories: E66.09

## 2022-07-15 HISTORY — DX: Deficiency of other specified B group vitamins: E53.8

## 2022-07-15 HISTORY — DX: Depression, unspecified: F32.A

## 2022-07-15 LAB — B12 AND FOLATE PANEL
Folate: >23.9 ng/mL (ref >5.9)
Vitamin B-12: 441 pg/mL (ref 211–911)

## 2022-07-15 LAB — VITAMIN D 25 HYDROXY (VIT D DEFICIENCY, FRACTURES): VITD: 16.9 ng/mL — ABNORMAL LOW (ref 30.00–100.00)

## 2022-07-15 MED ORDER — ESCITALOPRAM OXALATE 10 MG PO TABS: 10.0000 mg | ORAL_TABLET | Freq: Every day | ORAL | 2 refills | Status: DC

## 2022-07-15 MED ORDER — FOLIC ACID 1 MG PO TABS: 1.0000 mg | ORAL_TABLET | Freq: Every day | ORAL | Status: DC

## 2022-07-15 MED ORDER — METHIMAZOLE 10 MG PO TABS: 10.0000 mg | ORAL_TABLET | Freq: Every day | ORAL | 3 refills | Status: DC

## 2022-07-15 MED ORDER — HYDROXYZINE PAMOATE 50 MG PO CAPS: ORAL_CAPSULE | ORAL | 0 refills | Status: DC

## 2022-07-15 NOTE — Patient Instructions (Addendum)
  Recommend vitamin D3 2000 Iu once daily.   Welcome to our clinic, I am happy to have you as my new patient. I am excited to continue on this healthcare journey with you.  ------------------------------------  Start 10 mg for anxiety and depression. Take 1/2 tablet by mouth once daily for about one week, then increase to 1 full tablet thereafter.   Taking the medicine as directed and not missing any doses is one of the best things you can do to treat your anxiety/depression.  Here are some things to keep in mind:  Side effects (stomach upset, some increased anxiety) may happen before you notice a benefit.  These side effects typically go away over time. Changes to your dose of medicine or a change in medication all together is sometimes necessary Many people will notice an improvement within two weeks but the full effect of the medication can take up to 4-6 weeks Stopping the medication when you start feeling better often results in a return of symptoms. Most people need to be on medication at least 6-12 months If you start having thoughts of hurting yourself or others after starting this medicine, please call me immediately.     A referral was placed today for therapy. Please let us know if you have not heard back within 2 weeks about the referral.  ------------------------------------  Stop by the lab prior to leaving today. I will notify you of your results once received.   Please keep in mind Any my chart messages you send have up to a three business day turnaround for a response.  Phone calls may take up to a one full business day turnaround for a  response.   If you need a medication refill I recommend you request it through the pharmacy as this is easiest for [place] rather than sending a message and or phone call.   Due to recent changes in healthcare laws, you may see results of your imaging and/or laboratory studies on MyChart before I have had a chance to review them.  I  understand that in some cases there may be results that are confusing or concerning to you. Please understand that not all results are received at the same time and often I may need to interpret multiple results in order to provide you with the best plan of care or course of treatment. Therefore, I ask that you please give me 2 business days to thoroughly review all your results before contacting my office for clarification. Should we see a critical lab result, you will be contacted sooner.   It was a pleasure seeing you today! Please do not hesitate to reach out with any questions and or concerns.  Regards,   Eugenia Pancoast FNP-C

## 2022-07-15 NOTE — Assessment & Plan Note (Signed)
Cont f/u with endo

## 2022-07-15 NOTE — Assessment & Plan Note (Signed)
Ordered vitamin d pending results.  Recommend daily vitamin D3 2000 IU

## 2022-07-15 NOTE — Assessment & Plan Note (Signed)
Stable cont f/u as scheduled with cardiology

## 2022-07-15 NOTE — Assessment & Plan Note (Signed)
Smoking cessation instruction/counseling given:  counseled patient on the dangers of tobacco use, advised patient to stop smoking, and reviewed strategies to maximize success 

## 2022-07-15 NOTE — Assessment & Plan Note (Signed)
Order b12 folate pending results

## 2022-07-15 NOTE — Progress Notes (Signed)
New Patient Office Visit  Subjective:  Patient ID: Katelyn Soto, female    DOB: March 11, 1981  Age: 42 y.o. MRN: [id number]  CC:  Chief Complaint  Patient presents with   Establish Care    HPI Katelyn Soto is here to establish care as a new patient.  Oriented to practice routines and expectations.  Prior provider was: PA [employer], wanted to switch wasn't a good fit.   Pt is with acute concerns. Worsening anxiety. Also used to get neck muscle spasms in the past, would be on prednisone with some relief of symptoms.  Uses tizanidine when this occurs which helps.   Positive Hpv 11/22, needed repeat one year, has appt tomorrow with new GYN for this.   chronic concerns:  Hyperthyroidism: seeing endocrinologist seeing shamleffer , currently on methimazole 10 mg once daily which was recently decreased to half daily.   Tachycardia/ heart murmur: sees cardiologist annually next visit to be in April 2024. Was on metoprolol however now controlled without medication as thyroid is stabilizing.   Tobacco abuse: ongoing stress and worry due to current life stressors. Has tried to quit and can go weeks without smoking, however when things happen they get overwhelming and smokes with stress to manage her anxiety. Used to drink in the past, would drink in the past however no longer.   States taking daily folic acid as she was told by her previous Gyn to take folic acid. She is unsure why she was started on this.   Anxiety, depression: worsening with stressors.  Would like to look into therapy as a lot of stressors at once.   ROS: Negative unless specifically indicated above in HPI.   Current Outpatient Medications:    escitalopram (LEXAPRO) 10 MG tablet, Take 1 tablet (10 mg total) by mouth daily., Disp: 30 tablet, Rfl: 2   hydrOXYzine (VISTARIL) 50 MG capsule, Take one po bid prn anxiety, Disp: 30 capsule, Rfl: 0   tiZANidine (ZANAFLEX) 2 MG tablet, Take 1 tablet (2 mg total) by mouth every  8 (eight) hours as needed for muscle spasms., Disp: 21 tablet, Rfl: 0   folic acid (FOLVITE) 1 MG tablet, Take 1 tablet (1 mg total) by mouth daily., Disp: , Rfl:    methimazole (TAPAZOLE) 10 MG tablet, Take 1 tablet (10 mg total) by mouth daily., Disp: 90 tablet, Rfl: 3 Past Medical History:  Diagnosis Date   Anemia    Cardiac murmur 02/10/2020   Chest tightness 11/14/2017   Cigarette smoker 11/14/2017   Graves disease 07/04/2020   History of COVID-19 02/10/2020   Hyperthyroidism 03/10/2020   Iron deficiency anemia 01/14/2016   Obesity (BMI 30.0-34.9) 11/15/2020   Palpitations 02/10/2020   Shortness of breath 02/10/2020   Tachycardia 02/10/2020   Vitamin D deficiency 01/14/2016   Past Surgical History:  Procedure Laterality Date   ESSURE TUBAL LIGATION      Objective:   Today's Vitals: BP 124/82   Pulse 92   Temp 97.6 F (36.4 C) (Temporal)   Ht 5' 4"$  (1.626 m)   Wt 183 lb 3.2 oz (83.1 kg)   LMP 06/10/2022   SpO2 99%   BMI 31.45 kg/m   Physical Exam Constitutional:      General: She is not in acute distress.    Appearance: Normal appearance. She is normal weight. She is not ill-appearing, toxic-appearing or diaphoretic.  HENT:     Head: Normocephalic.     Right Ear: Tympanic membrane normal.  Left Ear: Tympanic membrane normal.     Nose: Nose normal.     Mouth/Throat:     Mouth: Mucous membranes are dry.     Pharynx: No oropharyngeal exudate or posterior oropharyngeal erythema.  Eyes:     Extraocular Movements: Extraocular movements intact.     Pupils: Pupils are equal, round, and reactive to light.  Cardiovascular:     Rate and Rhythm: Normal rate and regular rhythm.     Pulses: Normal pulses.     Heart sounds: Normal heart sounds.  Pulmonary:     Effort: Pulmonary effort is normal.     Breath sounds: Normal breath sounds.  Musculoskeletal:     Cervical back: Normal range of motion.     Right lower leg: No edema.     Left lower leg: No edema.  Neurological:      General: No focal deficit present.     Mental Status: She is alert and oriented to person, place, and time. Mental status is at baseline.  Psychiatric:        Mood and Affect: Mood normal.        Behavior: Behavior normal.        Thought Content: Thought content normal.        Judgment: Judgment normal.     Assessment & Plan:  Class 1 obesity due to excess calories without serious comorbidity with body mass index (BMI) of 31.0 to 31.9 in adult  Cigarette smoker Assessment & Plan: Smoking cessation instruction/counseling given:  counseled patient on the dangers of tobacco use, advised patient to stop smoking, and reviewed strategies to maximize success    Vitamin D deficiency Assessment & Plan: Ordered vitamin d pending results.  Recommend daily vitamin D3 2000 IU   Orders: -     VITAMIN D 25 Hydroxy (Vit-D Deficiency, Fractures)  Folate deficiency Assessment & Plan: Order b12 folate pending results  Orders: -     B12 and Folate Panel  Anxiety and depression -     Ambulatory referral to Psychology -     hydrOXYzine Pamoate; Take one po bid prn anxiety  Dispense: 30 capsule; Refill: 0 -     Escitalopram Oxalate; Take 1 tablet (10 mg total) by mouth daily.  Dispense: 30 tablet; Refill: 2  Anxiety state -     hydrOXYzine Pamoate; Take one po bid prn anxiety  Dispense: 30 capsule; Refill: 0 -     Escitalopram Oxalate; Take 1 tablet (10 mg total) by mouth daily.  Dispense: 30 tablet; Refill: 2  Anxiety Assessment & Plan: Trial start lexapro   I instructed pt to start 1/2 tablet once daily for 1 week and then increase to a full tablet once daily on week two as tolerated.  We discussed common side effects such as nausea, drowsiness and weight gain.  Also discussed rare but serious side effect of suicidal ideation.  She is instructed to discontinue medication and go directly to ED if this occurs.  Pt verbalizes understanding.  Plan is to follow up in 30 days to evaluate  progress.   Rx hydroxyxine 50 mg prn anxiety state  Referral placed for therapy     Other iron deficiency anemia Assessment & Plan: Stable per last labs   Cardiac murmur Assessment & Plan: Stable cont f/u as scheduled with cardiology    Hyperthyroidism Assessment & Plan: Cont f/u with endo    Other orders -     methIMAzole; Take 1 tablet (10 mg total) by  mouth daily.  Dispense: 90 tablet; Refill: 3 -     Folic Acid; Take 1 tablet (1 mg total) by mouth daily.    Follow-up: Return in about 6 weeks (around 08/26/2022) for f/u anxiety.   Eugenia Pancoast, FNP

## 2022-07-15 NOTE — Assessment & Plan Note (Signed)
Stable per last labs

## 2022-07-15 NOTE — Assessment & Plan Note (Signed)
Trial start lexapro   I instructed pt to start 1/2 tablet once daily for 1 week and then increase to a full tablet once daily on week two as tolerated.  We discussed common side effects such as nausea, drowsiness and weight gain.  Also discussed rare but serious side effect of suicidal ideation.  She is instructed to discontinue medication and go directly to ED if this occurs.  Pt verbalizes understanding.  Plan is to follow up in 30 days to evaluate progress.   Rx hydroxyxine 50 mg prn anxiety state  Referral placed for therapy

## 2022-07-16 ENCOUNTER — Other Ambulatory Visit: Payer: Self-pay | Admitting: Family

## 2022-07-16 ENCOUNTER — Ambulatory Visit: Payer: Managed Care, Other (non HMO) | Admitting: Obstetrics & Gynecology

## 2022-07-16 ENCOUNTER — Encounter: Payer: Self-pay | Admitting: Obstetrics & Gynecology

## 2022-07-16 ENCOUNTER — Other Ambulatory Visit (HOSPITAL_COMMUNITY)
Admission: RE | Admit: 2022-07-16 | Discharge: 2022-07-16 | Disposition: A | Discharge status: Home / Self Care | Payer: Managed Care, Other (non HMO) | Source: Ambulatory Visit | Attending: Obstetrics & Gynecology | Admitting: Obstetrics & Gynecology

## 2022-07-16 VITALS — BP 124/78 | HR 89 | Wt 182.0 lb

## 2022-07-16 DIAGNOSIS — R87810 Cervical high risk human papillomavirus (HPV) DNA test positive: Secondary | ICD-10-CM | POA: Diagnosis not present

## 2022-07-16 DIAGNOSIS — E559 Vitamin D deficiency, unspecified: Secondary | ICD-10-CM

## 2022-07-16 MED ORDER — VITAMIN D (ERGOCALCIFEROL) 1.25 MG (50000 UNIT) PO CAPS: 50000.0000 [IU] | ORAL_CAPSULE | ORAL | 0 refills | Status: AC

## 2022-07-16 NOTE — Patient Instructions (Signed)
If pap is normal, you will need repeat next year.  If abnormal, will need further evaluation.

## 2022-07-16 NOTE — Progress Notes (Signed)
   GYNECOLOGY OFFICE VISIT NOTE  History:   Katelyn Soto is a 42 y.o. F here today for follow up pap smear; had pap with positive HRHPV in 03/2021. She denies any abnormal vaginal discharge, bleeding, pelvic pain or other concerns.    Past Medical History:  Diagnosis Date   Anemia    Cardiac murmur 02/10/2020   Chest tightness 11/14/2017   Cigarette smoker 11/14/2017   Graves disease 07/04/2020   History of COVID-19 02/10/2020   Hyperthyroidism 03/10/2020   Iron deficiency anemia 01/14/2016   Obesity (BMI 30.0-34.9) 11/15/2020   Palpitations 02/10/2020   Shortness of breath 02/10/2020   Tachycardia 02/10/2020   Vitamin D deficiency 01/14/2016    Past Surgical History:  Procedure Laterality Date   ESSURE TUBAL LIGATION      The following portions of the patient's history were reviewed and updated as appropriate: allergies, current medications, past family history, past medical history, past social history, past surgical history and problem list.   Health Maintenance:  Normal pap but positive HRHPV (negative 16, 18/45) on 04/04/2021.  Normal mammogram on 06/23/2020.   Review of Systems:  Pertinent items noted in HPI and remainder of comprehensive ROS otherwise negative.  Physical Exam:  BP 124/78   Pulse 89   Wt 182 lb (82.6 kg)   LMP 07/15/2022   BMI 31.24 kg/m  CONSTITUTIONAL: Well-developed, well-nourished female in no acute distress.  MUSCULOSKELETAL: Normal range of motion. No edema noted. NEUROLOGIC: Alert and oriented to person, place, and time. Normal muscle tone coordination. No cranial nerve deficit noted. PSYCHIATRIC: Normal mood and affect. Normal behavior. Normal judgment and thought content. CARDIOVASCULAR: Normal heart rate noted RESPIRATORY: Effort and breath sounds normal, no problems with respiration noted ABDOMEN: No masses noted. No other overt distention noted.   PELVIC: Normal appearing external genitalia; normal urethral meatus; normal appearing vaginal mucosa  and cervix. Patient is currently menstruating, menstrual blood cleared with three fox swabs.  Pap smear done.  No abnormal discharge noted.  Normal uterine size, no other palpable masses, no uterine or adnexal tenderness. Performed in the presence of a chaperone    Assessment and Plan:    1. Cervical high risk HPV (human papillomavirus) test positive - Cytology - PAP done. If normal, repeat in one year.  If abnormal, will need further evaluation. Routine preventative health maintenance measures emphasized, advised to schedule mammogram soon. Please refer to After Visit Summary for other counseling recommendations.   Return for any gynecologic concerns.    I spent 20 minutes dedicated to the care of this patient including pre-visit review of records, face to face time with the patient discussing her conditions and treatments and post visit orders.    Verita Schneiders, MD, [place] [employer], [place]

## 2022-07-19 LAB — CYTOLOGY - PAP
Comment: NEGATIVE
Diagnosis: NEGATIVE
Diagnosis: REACTIVE
High risk HPV: NEGATIVE

## 2022-07-22 ENCOUNTER — Telehealth: Payer: Self-pay

## 2022-07-22 NOTE — Telephone Encounter (Signed)
Notified pt of her upcoming mammo and ultrasound appointment. Pt understood and was provided the number to reschedule if needed.

## 2022-07-24 ENCOUNTER — Other Ambulatory Visit: Payer: Self-pay | Admitting: Obstetrics & Gynecology

## 2022-07-24 ENCOUNTER — Ambulatory Visit
Admission: RE | Admit: 2022-07-24 | Discharge: 2022-07-24 | Disposition: A | Discharge status: Home / Self Care | Payer: Managed Care, Other (non HMO) | Source: Ambulatory Visit | Attending: Obstetrics & Gynecology | Admitting: Obstetrics & Gynecology

## 2022-07-24 DIAGNOSIS — R102 Pelvic and perineal pain: Secondary | ICD-10-CM | POA: Insufficient documentation

## 2022-07-24 DIAGNOSIS — M79605 Pain in left leg: Secondary | ICD-10-CM

## 2022-07-24 DIAGNOSIS — Z1231 Encounter for screening mammogram for malignant neoplasm of breast: Secondary | ICD-10-CM

## 2022-07-24 DIAGNOSIS — M79602 Pain in left arm: Secondary | ICD-10-CM

## 2022-07-24 DIAGNOSIS — R0602 Shortness of breath: Secondary | ICD-10-CM

## 2022-07-24 DIAGNOSIS — R87810 Cervical high risk human papillomavirus (HPV) DNA test positive: Secondary | ICD-10-CM

## 2022-07-24 DIAGNOSIS — Z758 Other problems related to medical facilities and other health care: Secondary | ICD-10-CM

## 2022-08-06 ENCOUNTER — Encounter: Payer: Self-pay | Admitting: Internal Medicine

## 2022-08-06 ENCOUNTER — Ambulatory Visit (INDEPENDENT_AMBULATORY_CARE_PROVIDER_SITE_OTHER): Payer: Managed Care, Other (non HMO) | Admitting: Internal Medicine

## 2022-08-06 VITALS — BP 120/80 | HR 100 | Ht 64.0 in | Wt 183.8 lb

## 2022-08-06 DIAGNOSIS — E0590 Thyrotoxicosis, unspecified without thyrotoxic crisis or storm: Secondary | ICD-10-CM | POA: Diagnosis not present

## 2022-08-06 DIAGNOSIS — E0500 Thyrotoxicosis with diffuse goiter without thyrotoxic crisis or storm: Secondary | ICD-10-CM

## 2022-08-06 LAB — TSH: TSH: 0.02 u[IU]/mL — ABNORMAL LOW (ref 0.35–5.50)

## 2022-08-06 LAB — T4, FREE: Free T4: 1.27 ng/dL (ref 0.60–1.60)

## 2022-08-06 NOTE — Progress Notes (Unsigned)
Name: Katelyn Soto  MRN/ DOB: [id number], [birth year]    Age/ Sex: 42 y.o., female     PCP: Eugenia Pancoast, FNP   Reason for Endocrinology Evaluation: Hyperthyroidism     Initial Endocrinology Clinic Visit: 03/21/2020    PATIENT IDENTIFIER: Katelyn Soto is a 42 y.o., female with a past medical history of anemia and hyperthyroidism.  She has followed with [facility] since 03/21/2020 for consultative assistance with management of her hyperthyroidism  HISTORICAL SUMMARY:    She has been diagnosed with hyperthyroidism in 01/2020 with a suppressed TSH < 0.005 uIU/mL with an elevated T4 and FT3 at 7.77 ng/dL and 22.8 pg/mL respectively.    She had COVID infection 2 weeks prior to this. She was having fatigue, weight loss and palpitations, was having constipation and dysphagia.       Pt was started on Methimazole 03/14/2020 and Metoprolol.  TRAB elevated at 29.20 IU/L   No Fh of thyroid disease  SUBJECTIVE:    Today (08/06/2022):  Katelyn Soto is here for hyperthyroidism.    Weight has been stable  She has noted decrease in neck size  Has mild constipation ~ 2-3 weeks that has resolved but no  diarrhea  Denies palpitations  Denies tremors  Denies burning  or itching of the eyes, sees ophthalmology    HOME ENDOCRINE MEDICATIONS: Methimazole 10 mg, half a  tab daily       HISTORY:  Past Medical History:  Past Medical History:  Diagnosis Date   Anemia    Cardiac murmur 02/10/2020   Chest tightness 11/14/2017   Cigarette smoker 11/14/2017   Graves disease 07/04/2020   History of COVID-19 02/10/2020   Hyperthyroidism 03/10/2020   Iron deficiency anemia 01/14/2016   Obesity (BMI 30.0-34.9) 11/15/2020   Palpitations 02/10/2020   Shortness of breath 02/10/2020   Tachycardia 02/10/2020   Vitamin D deficiency 01/14/2016   Past Surgical History:  Past Surgical History:  Procedure Laterality Date   ESSURE TUBAL LIGATION     Social History:  reports that she has  quit smoking. Her smoking use included cigarettes. She has a 6.50 pack-year smoking history. She has never used smokeless tobacco. She reports current alcohol use. She reports that she does not use drugs. Family History:  Family History  Problem Relation Age of Onset   Hypertension Mother    Diabetes Mother    Heart disease Mother    Heart failure Mother    Cancer Father        throat     HOME MEDICATIONS: Allergies as of 08/06/2022   No Known Allergies      Medication List        Accurate as of August 06, 2022 10:10 AM. If you have any questions, ask your nurse or doctor.          escitalopram 10 MG tablet Commonly known as: Lexapro Take 1 tablet (10 mg total) by mouth daily.   folic acid 1 MG tablet Commonly known as: FOLVITE Take 1 tablet (1 mg total) by mouth daily.   hydrOXYzine 50 MG capsule Commonly known as: VISTARIL Take one po bid prn anxiety   methimazole 10 MG tablet Commonly known as: TAPAZOLE Take 1 tablet (10 mg total) by mouth daily.   tiZANidine 2 MG tablet Commonly known as: ZANAFLEX Take 1 tablet (2 mg total) by mouth every 8 (eight) hours as needed for muscle spasms.   Vitamin D (Ergocalciferol) 1.25 MG (50000 UNIT) Caps  capsule Commonly known as: DRISDOL Take 1 capsule (50,000 Units total) by mouth every 7 (seven) days.          OBJECTIVE:   PHYSICAL EXAM: VS: LMP 07/15/2022    EXAM: General: Pt appears well and is in NAD  Neck: General: Supple without adenopathy. Thyroid: Thyroid gland is prominent.   No goiter or nodules appreciated.   Lungs: Clear with good BS bilat with no rales, rhonchi, or wheezes  Heart: Auscultation: RRR.  Abdomen: Normoactive bowel sounds, soft, nontender, without masses or organomegaly palpable  Extremities:  BL LE: No pretibial edema normal ROM and strength.  Mental Status: Judgment, insight: Intact Orientation: Oriented to time, place, and person Mood and affect: No depression, anxiety, or  agitation     DATA REVIEWED:    Latest Reference Range & Units 05/03/22 18:41  Sodium 135 - 145 mmol/L 139  Potassium 3.5 - 5.1 mmol/L 4.2  Chloride 98 - 111 mmol/L 106  CO2 22 - 32 mmol/L 25  Glucose 70 - 99 mg/dL 82  BUN 6 - 20 mg/dL 10  Creatinine 0.44 - 1.00 mg/dL 0.84  Calcium 8.9 - 10.3 mg/dL 9.1  Anion gap 5 - 15  8  GFR, Estimated >60 mL/min >60    Latest Reference Range & Units 05/03/22 18:41  WBC 4.0 - 10.5 K/uL 7.2  RBC 3.87 - 5.11 MIL/uL 5.56 (H)  Hemoglobin 12.0 - 15.0 g/dL 13.0  HCT 36.0 - 46.0 % 42.1  MCV 80.0 - 100.0 fL 75.7 (L)  MCH 26.0 - 34.0 pg 23.4 (L)  MCHC 30.0 - 36.0 g/dL 30.9  RDW 11.5 - 15.5 % 15.0  Platelets 150 - 400 K/uL 193  nRBC 0.0 - 0.2 % 0.0      TRAB Latest Ref Range: <=2.00 IU/L 29.20 (H)    Thyroid ultrasound 02/24/2020 Nonspecific thyroid heterogeneity compatible with medical thyroid disease. No other significant finding or nodule by ultrasound.  ASSESSMENT / PLAN / RECOMMENDATIONS:   Hyperthyroidism secondary to Graves' Disease  - Pt is clinically  euthyroid  - No local neck symptoms  -We had  discussed the various treatment options for hyperthyroidism and Graves disease including ablation therapy with radioactive iodine versus antithyroid drug treatment versus surgical therapy.    -I carefully explained to the patient that one of the consequences of I-131 ablation treatment would likely be permanent hypothyroidism which would require long-term replacement therapy with LT4. - She is comfortable with methimazole at this time  - TFT's normal , no change   Medications    Continue  methimazole 10 mg, half a tablet daily   2. Graves' disease:   - No extrathyroidal manifestations of Graves' disease - Up to date on eye exam 2022    F/U in 4 months     Signed electronically by: Mack Guise, MD  [facility]  [address] [place], [place]  [address] Phone: [phone] FAX: [phone]      CC: Eugenia Pancoast, [place] [address] Phone: [phone]  Fax: [phone]   Return to Endocrinology clinic as below: Future Appointments  Date Time Provider [place]  08/06/2022  3:00 PM Gae Bihl, Melanie Crazier, MD [facility] None  08/09/2022  2:30 PM Katherina Right, LCSW [facility] None  08/13/2022  2:00 PM [facility]1 [facility] [place]  09/02/2022  8:00 AM Eugenia Pancoast, FNP [facility]

## 2022-08-07 MED ORDER — METHIMAZOLE 10 MG PO TABS: 10.0000 mg | ORAL_TABLET | ORAL | 3 refills | Status: DC

## 2022-08-09 ENCOUNTER — Ambulatory Visit (INDEPENDENT_AMBULATORY_CARE_PROVIDER_SITE_OTHER): Payer: 59 | Admitting: Clinical

## 2022-08-09 DIAGNOSIS — F331 Major depressive disorder, recurrent, moderate: Secondary | ICD-10-CM | POA: Diagnosis not present

## 2022-08-09 DIAGNOSIS — F419 Anxiety disorder, unspecified: Secondary | ICD-10-CM | POA: Diagnosis not present

## 2022-08-09 NOTE — Progress Notes (Signed)
                Klynn Linnemann, LCSW 
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
## 2022-08-13 ENCOUNTER — Ambulatory Visit
Admission: RE | Admit: 2022-08-13 | Discharge: 2022-08-13 | Disposition: A | Discharge status: Home / Self Care | Payer: Managed Care, Other (non HMO) | Source: Ambulatory Visit | Attending: Obstetrics & Gynecology | Admitting: Obstetrics & Gynecology

## 2022-08-13 DIAGNOSIS — Z1231 Encounter for screening mammogram for malignant neoplasm of breast: Secondary | ICD-10-CM | POA: Diagnosis not present

## 2022-08-20 ENCOUNTER — Ambulatory Visit: Payer: Medicaid Other | Admitting: Family

## 2022-08-26 ENCOUNTER — Ambulatory Visit (INDEPENDENT_AMBULATORY_CARE_PROVIDER_SITE_OTHER): Payer: 59 | Admitting: Clinical

## 2022-08-26 DIAGNOSIS — F331 Major depressive disorder, recurrent, moderate: Secondary | ICD-10-CM | POA: Diagnosis not present

## 2022-08-26 DIAGNOSIS — F419 Anxiety disorder, unspecified: Secondary | ICD-10-CM

## 2022-08-26 NOTE — Progress Notes (Addendum)
[facility] Counselor/Therapist Progress Note  Patient ID: Katelyn Soto, MRN: [id number]    Date: 08/26/22  Time Spent: 1:30  pm - 2:08 pm : 38 Minutes  Treatment Type: Individual Therapy.  Reported Symptoms: Patient reported a recent decrease in symptoms of anxiety and depression.   Mental Status Exam: Appearance:  Well Groomed     Behavior: Appropriate  Motor: Normal  Speech/Language:  Clear and Coherent  Affect: Appropriate  Mood: normal  Thought process: normal  Thought content:   WNL  Sensory/Perceptual disturbances:   WNL  Orientation: oriented to person, place, and situation  Attention: Good  Concentration: Good  Memory: WNL  Fund of knowledge:  Good  Insight:   Good  Judgment:  Good  Impulse Control: Good   Risk Assessment: Danger to Self:  No Patient denied current suicidal ideation  Self-injurious Behavior: No Danger to Others: No Patient denied current homicidal ideation  Duty to Warn:no Physical Aggression / Violence:No  Access to Firearms a concern: No  Gang Involvement:No   Subjective:  Patient stated, "everything has been going pretty good". Patient stated, "I'm calming down a little bit more". Patient reported recent decrease in stressors. Patient reported improvement in transportation and reported financial stressors have improved since last session. Patient stated, "better" in response to mood since last session and stated, "I'm looking at things in a better light, I see some light at the end of the tunnel". Patient stated, "I'm in a good mood today". Patient stated, "I'm fine with that" in response to referral to a psychiatrist. Patient stated, "that's fine" in response to participation in therapy. Patient stated, "finding myself again", "trying to figure out what bothered/triggered me" in response to goals for therapy. Patient state, "enjoying life" when clinician inquired to clarify patient's meaning of "finding myself again". Patient  agreed to goals/treatment plan.   Interventions: Motivational Interviewing and psycho education.  Clinician conducted session via WebEx video from clinician's home office. Patient provided verbal consent to proceed with telehealth session and participated in session from a local park. Clinician reviewed limits of confidentiality due to patient's virtual setting. Reviewed events since last session. Clinician reviewed diagnoses and treatment recommendations. Provided psycho education related to diagnoses and treatment. Clinician utilized motivational interviewing to explore potential goals for therapy. Clinician utilized a task centered approach in collaboration with patient to develop goals for therapy. Clinician requested patient complete thought record for homework.   Diagnosis:  Major depressive disorder, recurrent episode, moderate  Anxiety disorder, unspecified type   Plan: Patient is to utilize [employer] [facility], problem solving, solution focused, and coping strategies to decrease symptoms associated with their diagnoses. Frequency: bi-weekly  Modality: individual     Long-term goal:   Identify triggers for symptoms of depression and anxiety and develop/implement coping strategies to utilize in response to reduce intensity and frequency of symptoms from 2 days/week to 0 days/week per patient report for at least 3 consecutive months. Target Date: 08/26/23  Progress: 0   Short-term goal:  Identify and verbally express contributing factors to patient's internal happiness Target Date: 02/25/23  Progress: 0   Identify and implement activities patient enjoys, such as, traveling and establishing an exercise routine Target Date: 02/25/23  Progress: 0   Develop and implement strategies to increase self care, such as, activities to support patient's physical and mental health Target Date: 02/25/23  Progress: 0  Zakery Normington, LCSW 

## 2022-09-02 ENCOUNTER — Ambulatory Visit (INDEPENDENT_AMBULATORY_CARE_PROVIDER_SITE_OTHER): Payer: Managed Care, Other (non HMO) | Admitting: Family

## 2022-09-02 ENCOUNTER — Encounter: Payer: Self-pay | Admitting: Family

## 2022-09-02 VITALS — BP 122/80 | HR 78 | Temp 98.1°F | Ht 64.0 in | Wt 181.6 lb

## 2022-09-02 DIAGNOSIS — F17210 Nicotine dependence, cigarettes, uncomplicated: Secondary | ICD-10-CM | POA: Diagnosis not present

## 2022-09-02 DIAGNOSIS — F419 Anxiety disorder, unspecified: Secondary | ICD-10-CM | POA: Diagnosis not present

## 2022-09-02 DIAGNOSIS — E0590 Thyrotoxicosis, unspecified without thyrotoxic crisis or storm: Secondary | ICD-10-CM

## 2022-09-02 DIAGNOSIS — F32A Depression, unspecified: Secondary | ICD-10-CM | POA: Diagnosis not present

## 2022-09-02 NOTE — Progress Notes (Unsigned)
Established Patient Office Visit  Subjective:      CC:  Chief Complaint  Patient presents with   Medical Management of Chronic Issues    HPI: Katelyn Soto is a 42 y.o. female presenting on 09/02/2022 for Medical Management of Chronic Issues  Anxiety depression: has consulted with therapist, last visit was April 1st with her. She diagnosed her with MDD. She is going to be seeing her every two weeks. She has since started lexapro 10 mg 2/19 and doing well with this, helping her to sleep a bit more. She is not having to use hydroxyxine as she notices it makes her too drowsy.   Hyperthyroid: managed by endo. Up to date on scheduled visit.   Cigarette smoker, for now no desire to quit. Will consider in future.      Social history:  Relevant past medical, surgical, family and social history reviewed and updated as indicated. Interim medical history since our last visit reviewed.  Allergies and medications reviewed and updated.  DATA REVIEWED: CHART IN EPIC     ROS: Negative unless specifically indicated above in HPI.    Current Outpatient Medications:    escitalopram (LEXAPRO) 10 MG tablet, Take 1 tablet (10 mg total) by mouth daily., Disp: 30 tablet, Rfl: 2   methimazole (TAPAZOLE) 10 MG tablet, Take 1 tablet (10 mg total) by mouth as directed. 1 tablet Monday through Thursday, half a tablet on Friday, Saturday, and Sunday, Disp: 72 tablet, Rfl: 3   tiZANidine (ZANAFLEX) 2 MG tablet, Take 1 tablet (2 mg total) by mouth every 8 (eight) hours as needed for muscle spasms., Disp: 21 tablet, Rfl: 0   Vitamin D, Ergocalciferol, (DRISDOL) 1.25 MG (50000 UNIT) CAPS capsule, Take 1 capsule (50,000 Units total) by mouth every 7 (seven) days., Disp: 8 capsule, Rfl: 0      Objective:    BP 122/80 (BP Location: Left Arm)   Pulse 78   Temp 98.1 F (36.7 C) (Temporal)   Ht 5\' 4"  (1.626 m)   Wt 181 lb 9.6 oz (82.4 kg)   LMP 08/11/2022   SpO2 99%   BMI 31.17 kg/m   Wt Readings  from Last 3 Encounters:  09/02/22 181 lb 9.6 oz (82.4 kg)  08/06/22 183 lb 12.8 oz (83.4 kg)  07/16/22 182 lb (82.6 kg)    Physical Exam  Physical Exam Constitutional:      General: not in acute distress.    Appearance: Normal appearance. normal weight. is not ill-appearing, toxic-appearing or diaphoretic.  Cardiovascular:     Rate and Rhythm: Normal rate.  Pulmonary:     Effort: Pulmonary effort is normal.  Musculoskeletal:        General: Normal range of motion.  Neurological:     General: No focal deficit present.     Mental Status: alert and oriented to person, place, and time. Mental status is at baseline.  Psychiatric:        Mood and Affect: Mood normal.        Behavior: Behavior normal.        Thought Content: Thought content normal.        Judgment: Judgment normal.        Assessment & Plan:  Hyperthyroidism Assessment & Plan: Cont f/u with endo as scheduled.   Anxiety and depression Assessment & Plan: Improving Continue with therapist as scheduled.  Continue lexapro 10 mg as prescribed.   Cigarette smoker Assessment & Plan: Smoking cessation instruction/counseling given:  counseled  patient on the dangers of tobacco use, advised patient to stop smoking, and reviewed strategies to maximize success       Return in about 6 months (around 03/04/2023) for f/u anxiety.  Mort Sawyers, MSN, APRN, FNP-C [facility]

## 2022-09-02 NOTE — Patient Instructions (Signed)
  Take over the counter vitamin D3 2000 IU once daily after you complete with your RX dosing.    Regards,   Mort Sawyers FNP-C

## 2022-09-04 NOTE — Assessment & Plan Note (Signed)
Smoking cessation instruction/counseling given:  counseled patient on the dangers of tobacco use, advised patient to stop smoking, and reviewed strategies to maximize success 

## 2022-09-04 NOTE — Assessment & Plan Note (Signed)
Improving Continue with therapist as scheduled.  Continue lexapro 10 mg as prescribed.

## 2022-09-04 NOTE — Assessment & Plan Note (Signed)
Cont f/u with endo as scheduled. 

## 2022-09-09 ENCOUNTER — Ambulatory Visit (INDEPENDENT_AMBULATORY_CARE_PROVIDER_SITE_OTHER): Payer: 59 | Admitting: Clinical

## 2022-09-09 DIAGNOSIS — F419 Anxiety disorder, unspecified: Secondary | ICD-10-CM | POA: Diagnosis not present

## 2022-09-09 DIAGNOSIS — F331 Major depressive disorder, recurrent, moderate: Secondary | ICD-10-CM

## 2022-09-09 NOTE — Progress Notes (Addendum)
[facility] Counselor/Therapist Progress Note  Patient ID: Katelyn Soto, MRN: [id number],    Date: 09/09/2022  Time Spent: 1:34pm - 2:20pm : 46 minutes  Treatment Type: Individual Therapy  Reported Symptoms: Patient reported experiencing worry and anxiety.   Mental Status Exam: Appearance:  Well Groomed     Behavior: Appropriate  Motor: Normal  Speech/Language:  Clear and Coherent  Affect: Appropriate  Mood: normal  Thought process: normal  Thought content:   WNL  Sensory/Perceptual disturbances:   WNL  Orientation: oriented to person, place, and situation  Attention: Good  Concentration: Good  Memory: WNL  Fund of knowledge:  Good  Insight:   Good  Judgment:  Good  Impulse Control: Good   Risk Assessment: Danger to Self:  No Patient denied current suicidal ideation  Self-injurious Behavior: No Danger to Others: No Patient denied current homicidal ideation  Duty to Warn:no Physical Aggression / Violence:No  Access to Firearms a concern: No  Gang Involvement:No   Subjective: Patient stated, "things have been going pretty good" and stated, "Ive had a couple challenging moments but I was able to get past it". Patient stated, "I try to be as positive as I can" in response to stressors. Patient stated, "I try to take a moment from everything to just regroup". Patient reported her boyfriend's mood was "down" recently and patient reported she felt his mood was "bring me down". Patient reported she tries to look at situations from the other person's perspective. Patient reported in the past she was taking situations personally. Patient reported she sought resources for her boyfriend and her son as a result of recent stressors. Patient stated, "I try to look at the brighter side of things". Patient reported increased anxiety when her boyfriend lost his job. Patient reported she has been relying on her independence and has written a financial plan to be financially  independent. Patient stated, "I know I feel better" in response to her financial plan. Patient stated, "lately its been pretty good" in response to patient's mood. Patient stated, "I still worry a little". Patient reported she is worried about her son who is currently incarcerated and her 44 year old daughter. Patient reported she her son will be released on May 3rd and patient is nervous that her son doesn't have a plan upon his release. Patient stated, "that gives me a little anxiety". Patient stated, "I would like to hold off on that part" in response to a referral to a psychiatrist. Patient reported plans to follow up with her primary care provider for medication management.   Interventions: Cognitive Behavioral Therapy. Clinician conducted session via WebEx video from clinician's home office. Patient provided verbal consent to proceed with telehealth session and participated in session from a local park. Reviewed events since last session. Explored current coping strategies patient utilizes in response to stress that are effective. Discussed triggers for changes in mood and symptoms of anxiety. Provided clarification regarding treatment plan and recommendations. Clinician requested patient complete thought record for homework.     Diagnosis:  Major depressive disorder, recurrent episode, moderate   Anxiety disorder, unspecified type     Plan: Patient is to utilize [employer], problem solving, solution focused, and coping strategies to decrease symptoms associated with their diagnoses. Frequency: bi-weekly  Modality: individual      Long-term goal:   Identify triggers for symptoms of depression and anxiety and develop/implement coping strategies to utilize in response to reduce intensity and frequency of symptoms from 2  days/week to 0 days/week per patient report for at least 3 consecutive months. Target Date: 08/26/23  Progress: progressing    Short-term goal:  Identify and  verbally express contributing factors to patient's internal happiness Target Date: 02/25/23  Progress: progressing    Identify and implement activities patient enjoys, such as, traveling and establishing an exercise routine Target Date: 02/25/23  Progress: progressing    Develop and implement strategies to increase self care, such as, activities to support patient's physical and mental health Target Date: 02/25/23  Progress: progressing                             Doree Barthel, LCSW

## 2022-09-09 NOTE — Progress Notes (Signed)
                Yameli Delamater, LCSW 

## 2022-09-29 DIAGNOSIS — M542 Cervicalgia: Secondary | ICD-10-CM | POA: Diagnosis not present

## 2022-09-30 ENCOUNTER — Ambulatory Visit: Payer: 59 | Admitting: Clinical

## 2022-09-30 DIAGNOSIS — F331 Major depressive disorder, recurrent, moderate: Secondary | ICD-10-CM

## 2022-09-30 NOTE — Progress Notes (Signed)
                Abou Sterkel, LCSW 

## 2022-10-01 ENCOUNTER — Ambulatory Visit: Payer: Managed Care, Other (non HMO) | Attending: Cardiology | Admitting: Cardiology

## 2022-10-09 ENCOUNTER — Other Ambulatory Visit: Payer: Self-pay | Admitting: Family

## 2022-10-09 DIAGNOSIS — F411 Generalized anxiety disorder: Secondary | ICD-10-CM

## 2022-10-09 DIAGNOSIS — F419 Anxiety disorder, unspecified: Secondary | ICD-10-CM

## 2022-10-09 NOTE — Telephone Encounter (Signed)
Request for 90 day escitalopram (LEXAPRO) 10 MG tablet   LR- 07/15/22 ( 30 tabs/ 2 refills) LV- 09/02/22 NV- 03/04/23

## 2022-10-18 ENCOUNTER — Encounter: Payer: 59 | Admitting: Clinical

## 2022-10-18 NOTE — Progress Notes (Signed)
                Akshara Blumenthal, LCSWThis encounter was created in error - please disregard. 

## 2022-10-24 NOTE — Telephone Encounter (Signed)
You  [facility] Nurse4 months ago    LVMTCB. [facility] out of office all week. Patient will need to reschedule. CRM created. Mychart letter sent. Ok for [facility] to reschedule if patient returns call  Office visit for 3 month f/u

## 2022-11-20 ENCOUNTER — Ambulatory Visit: Payer: Managed Care, Other (non HMO) | Attending: Cardiology | Admitting: Cardiology

## 2023-01-06 ENCOUNTER — Encounter: Payer: Self-pay | Admitting: Internal Medicine

## 2023-02-10 ENCOUNTER — Ambulatory Visit (INDEPENDENT_AMBULATORY_CARE_PROVIDER_SITE_OTHER): Payer: Managed Care, Other (non HMO) | Admitting: Internal Medicine

## 2023-02-10 ENCOUNTER — Encounter: Payer: Self-pay | Admitting: Internal Medicine

## 2023-02-10 VITALS — BP 120/80 | HR 89 | Ht 64.0 in | Wt 192.6 lb

## 2023-02-10 DIAGNOSIS — E0590 Thyrotoxicosis, unspecified without thyrotoxic crisis or storm: Secondary | ICD-10-CM | POA: Diagnosis not present

## 2023-02-10 DIAGNOSIS — E0500 Thyrotoxicosis with diffuse goiter without thyrotoxic crisis or storm: Secondary | ICD-10-CM

## 2023-02-10 NOTE — Progress Notes (Unsigned)
Name: Katelyn Soto  MRN/ DOB: [id number], [birth year]    Age/ Sex: 42 y.o., female     PCP: Katelyn Sawyers, FNP   Reason for Endocrinology Evaluation: Hyperthyroidism     Initial Endocrinology Clinic Visit: 03/21/2020    PATIENT IDENTIFIER: Katelyn Soto is a 42 y.o., female with a past medical history of anemia and hyperthyroidism.  She has followed with [facility] since 03/21/2020 for consultative assistance with management of her hyperthyroidism  HISTORICAL SUMMARY:    She has been diagnosed with hyperthyroidism in 01/2020 with a suppressed TSH < 0.005 uIU/mL with an elevated T4 and FT3 at 7.77 ng/dL and 84.6 pg/mL respectively.    She had COVID infection 2 weeks prior to this. She was having fatigue, weight loss and palpitations, was having constipation and dysphagia.       Pt was started on Methimazole 03/14/2020 and Metoprolol.  TRAB elevated at 29.20 IU/L   No Fh of thyroid disease  SUBJECTIVE:    Today (02/10/2023):  Katelyn Soto is here for hyperthyroidism.   Pt has been noted with weight gain  Denies local neck swelling  No palpitations  Constipation resolved  Denies tremors  Denies itching of the eyes but has burning , last  ophthalmology exam 11/2022   HOME ENDOCRINE MEDICATIONS: Methimazole 10 mg, 1 tab Monday through Thursday, half a tablet Friday, Saturday, and Sunday      HISTORY:  Past Medical History:  Past Medical History:  Diagnosis Date   Anxiety and depression 07/15/2022   Cardiac murmur 02/10/2020   Cigarette smoker 11/14/2017   Class 1 obesity due to excess calories without serious comorbidity with body mass index (BMI) of 31.0 to 31.9 in adult 07/15/2022   Folate deficiency 07/15/2022   Graves disease 07/04/2020   Hyperthyroidism 03/10/2020   Iron deficiency anemia 01/14/2016   Vitamin D deficiency 01/14/2016   Past Surgical History:  Past Surgical History:  Procedure Laterality Date   ESSURE TUBAL LIGATION      Social History:  reports that she has quit smoking. Her smoking use included cigarettes. She has a 6.5 pack-year smoking history. She has never used smokeless tobacco. She reports current alcohol use. She reports that she does not use drugs. Family History:  Family History  Problem Relation Age of Onset   Hypertension Mother    Diabetes Mother    Heart disease Mother    Heart failure Mother    Cancer Father        throat   Breast cancer Paternal Aunt      HOME MEDICATIONS: Allergies as of 02/10/2023   No Known Allergies      Medication List        Accurate as of February 10, 2023 11:38 AM. If you have any questions, ask your nurse or doctor.          escitalopram 10 MG tablet Commonly known as: LEXAPRO TAKE 1 TABLET BY MOUTH EVERY DAY   methimazole 10 MG tablet Commonly known as: TAPAZOLE Take 1 tablet (10 mg total) by mouth as directed. 1 tablet Monday through Thursday, half a tablet on Friday, Saturday, and Sunday   tiZANidine 2 MG tablet Commonly known as: ZANAFLEX Take 1 tablet (2 mg total) by mouth every 8 (eight) hours as needed for muscle spasms.          OBJECTIVE:   PHYSICAL EXAM: VS: There were no vitals taken for this visit.    EXAM: General: Pt appears  well and is in NAD Right eye proptosis noted  Neck: General: Supple without adenopathy. Thyroid: Thyroid gland is prominent.   No goiter or nodules appreciated.   Lungs: Clear with good BS bilat with no rales, rhonchi, or wheezes  Heart: Auscultation: RRR.  Abdomen: Normoactive bowel sounds, soft, nontender, without masses or organomegaly palpable  Extremities:  BL LE: No pretibial edema normal ROM and strength.  Mental Status: Judgment, insight: Intact Orientation: Oriented to time, place, and person Mood and affect: No depression, anxiety, or agitation     DATA REVIEWED:   Latest Reference Range & Units 08/06/22 15:23  TSH 0.35 - 5.50 uIU/mL 0.02 (L)  T4,Free(Direct) 0.60 -  1.60 ng/dL 3.47  (L): Data is abnormally low  Latest Reference Range & Units 05/03/22 18:41  Sodium 135 - 145 mmol/L 139  Potassium 3.5 - 5.1 mmol/L 4.2  Chloride 98 - 111 mmol/L 106  CO2 22 - 32 mmol/L 25  Glucose 70 - 99 mg/dL 82  BUN 6 - 20 mg/dL 10  Creatinine 4.25 - 9.56 mg/dL 3.87  Calcium 8.9 - 56.4 mg/dL 9.1  Anion gap 5 - 15  8  GFR, Estimated >60 mL/min >60    Latest Reference Range & Units 05/03/22 18:41  WBC 4.0 - 10.5 K/uL 7.2  RBC 3.87 - 5.11 MIL/uL 5.56 (H)  Hemoglobin 12.0 - 15.0 g/dL 33.2  HCT 95.1 - 88.4 % 42.1  MCV 80.0 - 100.0 fL 75.7 (L)  MCH 26.0 - 34.0 pg 23.4 (L)  MCHC 30.0 - 36.0 g/dL 16.6  RDW 06.3 - 01.6 % 15.0  Platelets 150 - 400 K/uL 193  nRBC 0.0 - 0.2 % 0.0      TRAB Latest Ref Range: <=2.00 IU/L 29.20 (H)    Thyroid ultrasound 02/24/2020 Nonspecific thyroid heterogeneity compatible with medical thyroid disease. No other significant finding or nodule by ultrasound.  ASSESSMENT / PLAN / RECOMMENDATIONS:   Hyperthyroidism secondary to Graves' Disease  - Pt is clinically  euthyroid  - No local neck symptoms  -We had  discussed the various treatment options for hyperthyroidism and Graves disease including ablation therapy with radioactive iodine versus antithyroid drug treatment versus surgical therapy.   -Patient would not be a candidate for RAI due to right eye proptosis - She is comfortable with methimazole at this time  -TFTs normal, no change  Medications   Continue  methimazole 10 mg, 1 tablet Monday through Thursday, half a tablet Friday, Saturday, and Sunday   2. Graves' disease:   - No extrathyroidal manifestations of Graves' disease     F/U in 6 months     Signed electronically by: Lyndle Herrlich, MD  [facility]  [facility] [address] [place] [place], [place] [address] Phone: [phone] FAX: [phone]      CC: Katelyn Sawyers, FNP [address] [place] [address] Phone: [phone]  Fax: [phone]   Return to Endocrinology clinic as below: Future Appointments  Date Time Provider Department Center  02/10/2023  3:00 PM Searcy Miyoshi, Konrad Dolores, MD [facility] None  03/04/2023  8:00 AM Katelyn Sawyers, FNP [facility]

## 2023-02-11 LAB — T3, FREE: T3, Free: 2.9 pg/mL (ref 2.3–4.2)

## 2023-02-11 LAB — TSH: TSH: 1.1 u[IU]/mL (ref 0.35–5.50)

## 2023-02-11 LAB — T4, FREE: Free T4: 1.13 ng/dL (ref 0.60–1.60)

## 2023-02-11 MED ORDER — METHIMAZOLE 10 MG PO TABS: 10.0000 mg | ORAL_TABLET | ORAL | 3 refills | Status: DC

## 2023-03-04 ENCOUNTER — Encounter: Payer: Self-pay | Admitting: Family

## 2023-03-04 ENCOUNTER — Other Ambulatory Visit: Payer: Self-pay | Admitting: Family

## 2023-03-04 ENCOUNTER — Ambulatory Visit (INDEPENDENT_AMBULATORY_CARE_PROVIDER_SITE_OTHER): Payer: Managed Care, Other (non HMO) | Admitting: Family

## 2023-03-04 VITALS — BP 122/62 | HR 61 | Temp 97.3°F | Ht 64.0 in | Wt 190.8 lb

## 2023-03-04 DIAGNOSIS — M25512 Pain in left shoulder: Secondary | ICD-10-CM | POA: Diagnosis not present

## 2023-03-04 DIAGNOSIS — G8929 Other chronic pain: Secondary | ICD-10-CM

## 2023-03-04 DIAGNOSIS — M542 Cervicalgia: Secondary | ICD-10-CM

## 2023-03-04 DIAGNOSIS — E559 Vitamin D deficiency, unspecified: Secondary | ICD-10-CM | POA: Diagnosis not present

## 2023-03-04 DIAGNOSIS — M25511 Pain in right shoulder: Secondary | ICD-10-CM

## 2023-03-04 DIAGNOSIS — Z111 Encounter for screening for respiratory tuberculosis: Secondary | ICD-10-CM | POA: Diagnosis not present

## 2023-03-04 HISTORY — DX: Other chronic pain: G89.29

## 2023-03-04 MED ORDER — LIDOCAINE 5 % EX PTCH: 1.0000 | MEDICATED_PATCH | CUTANEOUS | 0 refills | Status: AC

## 2023-03-04 MED ORDER — VITAMIN D3 50 MCG (2000 UT) PO CAPS: 2000.0000 [IU] | ORAL_CAPSULE | Freq: Every day | ORAL | 3 refills | Status: AC

## 2023-03-04 MED ORDER — IBUPROFEN 600 MG PO TABS: 600.0000 mg | ORAL_TABLET | Freq: Three times a day (TID) | ORAL | 0 refills | Status: DC | PRN

## 2023-03-04 MED ORDER — TIZANIDINE HCL 4 MG PO TABS: ORAL_TABLET | ORAL | 0 refills | Status: AC

## 2023-03-04 NOTE — Patient Instructions (Signed)
A referral was placed today for physical therapy  Please let us know if you have not heard back within 2 weeks about the referral.  Recommend tylenol 500 mg and ibuprofen 600 mg together for pain every 6-8 hours.  Heat to site, lidocaine patches as needed.  Exercises to area as tolerated.

## 2023-03-04 NOTE — Telephone Encounter (Deleted)
Last OV: 03/04/2023 Pending OV: Nothing scheduled at this time Medication Tizanidine 4mg  Directions: 1/2 to 1 tablet prn for muscle spasms Last Refill: *** Qty: #*** with *** refills

## 2023-03-04 NOTE — Progress Notes (Signed)
Established Patient Office Visit  Subjective:      CC:  Chief Complaint  Patient presents with   Medical Management of Chronic Issues    HPI: Katelyn Soto is a 42 y.o. female presenting on 03/04/2023 for Medical Management of Chronic Issues . Anxiety depression: stopped lexapro bc it started to make her feel 'weird' . Her sex drive was decreasing as well. She has since started following with a therapist but then she hasn't gone for some time, she has since missed a few appts, and has not yet rescheduled.  Her last visit was in May.   New complaints:  She has been on [facility] off to [facility] for some ongoing muscles spasms.   About 8 years ago started with neck muscle spasms. Back then had a shot of toradol and given prednisone, with some relief. She states when it happens it starts in her neck and goes down to her bil shoulder blades, and is since increasing in frequency with muscular chest tightness left upper into shoulder. She was given muscle relaxer at most recent orthopedist urgent care visit, but states muscle relaxer did not help. She works on the computer daily as well so posture is poor at times.  She does state that she had a neck xray, and she was told that she had something in her neck that was 'irritated' and was told this was likely what was causing her shoulders to hurt. She does take ibuprofen and or tylenol when these occur which helps slightly.   Vitamin d def: not currently taking vitamin D daily.    Social history:  Relevant past medical, surgical, family and social history reviewed and updated as indicated. Interim medical history since our last visit reviewed.  Allergies and medications reviewed and updated.  DATA REVIEWED: CHART IN EPIC     ROS: Negative unless specifically indicated above in HPI.    Current Outpatient Medications:    Cholecalciferol (VITAMIN D3) 50 MCG (2000 UT) capsule, Take 1 capsule (2,000 Units total) by mouth  daily., Disp: 90 capsule, Rfl: 3   ibuprofen (ADVIL) 600 MG tablet, Take 1 tablet (600 mg total) by mouth every 8 (eight) hours as needed., Disp: 30 tablet, Rfl: 0   lidocaine (LIDODERM) 5 %, Place 1 patch onto the skin daily. Remove & Discard patch within 12 hours or as directed by MD, Disp: 30 patch, Rfl: 0   tiZANidine (ZANAFLEX) 4 MG tablet, Take 1/2 to one tablet prn muscle spasm, Disp: 30 tablet, Rfl: 0   methimazole (TAPAZOLE) 10 MG tablet, Take 1 tablet (10 mg total) by mouth as directed. 1 tablet Monday through Thursday, half a tablet on Friday, Saturday, and Sunday, Disp: 72 tablet, Rfl: 3      Objective:    BP 122/62   Pulse 61   Temp (!) 97.3 F (36.3 C) (Temporal)   Ht 5\' 4"  (1.626 m)   Wt 190 lb 12.8 oz (86.5 kg)   SpO2 99%   BMI 32.75 kg/m   Wt Readings from Last 3 Encounters:  03/04/23 190 lb 12.8 oz (86.5 kg)  02/10/23 192 lb 9.6 oz (87.4 kg)  09/02/22 181 lb 9.6 oz (82.4 kg)    Physical Exam Constitutional:      General: She is not in acute distress.    Appearance: Normal appearance. She is normal weight. She is not ill-appearing, toxic-appearing or diaphoretic.  HENT:     Head: Normocephalic.  Cardiovascular:  Rate and Rhythm: Normal rate and regular rhythm.  Pulmonary:     Effort: Pulmonary effort is normal.  Musculoskeletal:        General: Normal range of motion.     Cervical back: No pain with movement or muscular tenderness. Normal range of motion.  Neurological:     General: No focal deficit present.     Mental Status: She is alert and oriented to person, place, and time. Mental status is at baseline.  Psychiatric:        Mood and Affect: Mood normal.        Behavior: Behavior normal.        Thought Content: Thought content normal.        Judgment: Judgment normal.           Assessment & Plan:  Vitamin D deficiency Assessment & Plan: Recommend pt to restart daily vitamin D3 2000 IU   Orders: -     Vitamin D3; Take 1 capsule (2,000  Units total) by mouth daily.  Dispense: 90 capsule; Refill: 3  Chronic neck pain Assessment & Plan: Nothing on physical exam today's to support pain.  Patient states currently asymptomatic and present without flare.  Advised patient for next flare to try a trial of tizanidine as needed for muscle spasm as well as a combination of ibuprofen and Tylenol together.  Printed out neck exercises for patient and handout given to her prior to leaving.  Can also use lidocaine, heat or ice, the muscle relaxer and/or ibuprofen and Tylenol.  As it is a chronic nature will refer to physical therapy as well for both evaluation of neck to shoulder pain.  Also discussed proper posture  Orders: -     Ambulatory referral to Physical Therapy -     tiZANidine HCl; Take 1/2 to one tablet prn muscle spasm  Dispense: 30 tablet; Refill: 0 -     Ibuprofen; Take 1 tablet (600 mg total) by mouth every 8 (eight) hours as needed.  Dispense: 30 tablet; Refill: 0 -     Lidocaine; Place 1 patch onto the skin daily. Remove & Discard patch within 12 hours or as directed by MD  Dispense: 30 patch; Refill: 0  Chronic pain of both shoulders -     Ambulatory referral to Physical Therapy  Screening for tuberculosis -     QuantiFERON-TB Gold Plus     Return in about 1 year (around 03/03/2024) for f/u CPE.  Mort Sawyers, MSN, APRN, FNP-C [facility]

## 2023-03-04 NOTE — Assessment & Plan Note (Signed)
Recommend pt to restart daily vitamin D3 2000 IU

## 2023-03-04 NOTE — Assessment & Plan Note (Signed)
Nothing on physical exam today's to support pain.  Patient states currently asymptomatic and present without flare.  Advised patient for next flare to try a trial of tizanidine as needed for muscle spasm as well as a combination of ibuprofen and Tylenol together.  Printed out neck exercises for patient and handout given to her prior to leaving.  Can also use lidocaine, heat or ice, the muscle relaxer and/or ibuprofen and Tylenol.  As it is a chronic nature will refer to physical therapy as well for both evaluation of neck to shoulder pain.  Also discussed proper posture

## 2023-03-06 LAB — QUANTIFERON-TB GOLD PLUS
Mitogen-NIL: 8.24 [IU]/mL
NIL: 0.02 [IU]/mL
QuantiFERON-TB Gold Plus: NEGATIVE
TB1-NIL: 0.01 [IU]/mL
TB2-NIL: 0.01 [IU]/mL

## 2023-03-27 DIAGNOSIS — J029 Acute pharyngitis, unspecified: Secondary | ICD-10-CM | POA: Diagnosis not present

## 2023-03-27 DIAGNOSIS — J020 Streptococcal pharyngitis: Secondary | ICD-10-CM | POA: Diagnosis not present

## 2023-06-09 ENCOUNTER — Other Ambulatory Visit: Payer: Self-pay | Admitting: Family

## 2023-06-09 DIAGNOSIS — F32A Depression, unspecified: Secondary | ICD-10-CM

## 2023-06-09 DIAGNOSIS — F411 Generalized anxiety disorder: Secondary | ICD-10-CM

## 2023-06-11 ENCOUNTER — Ambulatory Visit: Payer: Managed Care, Other (non HMO) | Admitting: Family

## 2023-06-11 ENCOUNTER — Encounter: Payer: Self-pay | Admitting: *Deleted

## 2023-06-12 ENCOUNTER — Ambulatory Visit: Payer: Managed Care, Other (non HMO) | Admitting: Family

## 2023-06-13 ENCOUNTER — Ambulatory Visit (INDEPENDENT_AMBULATORY_CARE_PROVIDER_SITE_OTHER): Payer: Managed Care, Other (non HMO) | Admitting: Family

## 2023-06-13 ENCOUNTER — Encounter: Payer: Self-pay | Admitting: Family

## 2023-06-13 ENCOUNTER — Other Ambulatory Visit (HOSPITAL_COMMUNITY)
Admission: RE | Admit: 2023-06-13 | Discharge: 2023-06-13 | Disposition: A | Discharge status: Home / Self Care | Payer: Managed Care, Other (non HMO) | Source: Ambulatory Visit | Attending: Family | Admitting: Family

## 2023-06-13 VITALS — BP 116/84 | HR 78 | Temp 98.4°F | Ht 64.0 in | Wt 195.0 lb

## 2023-06-13 DIAGNOSIS — F32A Depression, unspecified: Secondary | ICD-10-CM | POA: Diagnosis not present

## 2023-06-13 DIAGNOSIS — E66811 Obesity, class 1: Secondary | ICD-10-CM | POA: Diagnosis not present

## 2023-06-13 DIAGNOSIS — F419 Anxiety disorder, unspecified: Secondary | ICD-10-CM

## 2023-06-13 DIAGNOSIS — D500 Iron deficiency anemia secondary to blood loss (chronic): Secondary | ICD-10-CM

## 2023-06-13 DIAGNOSIS — R350 Frequency of micturition: Secondary | ICD-10-CM | POA: Diagnosis not present

## 2023-06-13 DIAGNOSIS — E559 Vitamin D deficiency, unspecified: Secondary | ICD-10-CM | POA: Diagnosis not present

## 2023-06-13 DIAGNOSIS — Z13220 Encounter for screening for lipoid disorders: Secondary | ICD-10-CM | POA: Diagnosis not present

## 2023-06-13 DIAGNOSIS — Z113 Encounter for screening for infections with a predominantly sexual mode of transmission: Secondary | ICD-10-CM | POA: Diagnosis not present

## 2023-06-13 DIAGNOSIS — Z6831 Body mass index (BMI) 31.0-31.9, adult: Secondary | ICD-10-CM

## 2023-06-13 DIAGNOSIS — E6609 Other obesity due to excess calories: Secondary | ICD-10-CM | POA: Diagnosis not present

## 2023-06-13 HISTORY — DX: Frequency of micturition: R35.0

## 2023-06-13 LAB — LIPID PANEL
Cholesterol: 170 mg/dL (ref 0–200)
HDL: 37.3 mg/dL — ABNORMAL LOW (ref >39.00)
LDL Cholesterol: 115 mg/dL — ABNORMAL HIGH (ref 0–99)
NonHDL: 132.21
Total CHOL/HDL Ratio: 5
Triglycerides: 84 mg/dL (ref 0.0–149.0)
VLDL: 16.8 mg/dL (ref 0.0–40.0)

## 2023-06-13 LAB — CBC WITH DIFFERENTIAL/PLATELET
Basophils Absolute: 0 10*3/uL (ref 0.0–0.1)
Basophils Relative: 0.4 % (ref 0.0–3.0)
Eosinophils Absolute: 0.1 10*3/uL (ref 0.0–0.7)
Eosinophils Relative: 1.8 % (ref 0.0–5.0)
HCT: 39.3 % (ref 36.0–46.0)
Hemoglobin: 12.5 g/dL (ref 12.0–15.0)
Lymphocytes Relative: 39.9 % (ref 12.0–46.0)
Lymphs Abs: 2.1 10*3/uL (ref 0.7–4.0)
MCHC: 31.9 g/dL (ref 30.0–36.0)
MCV: 72.5 fL — ABNORMAL LOW (ref 78.0–100.0)
Monocytes Absolute: 0.4 10*3/uL (ref 0.1–1.0)
Monocytes Relative: 7.2 % (ref 3.0–12.0)
Neutro Abs: 2.7 10*3/uL (ref 1.4–7.7)
Neutrophils Relative %: 50.7 % (ref 43.0–77.0)
Platelets: 168 10*3/uL (ref 150.0–400.0)
RBC: 5.41 Mil/uL — ABNORMAL HIGH (ref 3.87–5.11)
RDW: 14.8 % (ref 11.5–15.5)
WBC: 5.3 10*3/uL (ref 4.0–10.5)

## 2023-06-13 LAB — COMPREHENSIVE METABOLIC PANEL
ALT: 9 U/L (ref 0–35)
AST: 10 U/L (ref 0–37)
Albumin: 4.2 g/dL (ref 3.5–5.2)
Alkaline Phosphatase: 42 U/L (ref 39–117)
BUN: 13 mg/dL (ref 6–23)
CO2: 27 meq/L (ref 19–32)
Calcium: 9.1 mg/dL (ref 8.4–10.5)
Chloride: 106 meq/L (ref 96–112)
Creatinine, Ser: 0.81 mg/dL (ref 0.40–1.20)
GFR: 88.23 mL/min (ref >60.00)
Glucose, Bld: 84 mg/dL (ref 70–99)
Potassium: 4.2 meq/L (ref 3.5–5.1)
Sodium: 138 meq/L (ref 135–145)
Total Bilirubin: 0.4 mg/dL (ref 0.2–1.2)
Total Protein: 7.1 g/dL (ref 6.0–8.3)

## 2023-06-13 LAB — POCT URINE DIPSTICK
Bilirubin, UA: NEGATIVE
Blood, UA: NEGATIVE
Glucose, UA: NEGATIVE mg/dL
Ketones, POC UA: NEGATIVE mg/dL
Leukocytes, UA: NEGATIVE
Nitrite, UA: NEGATIVE
Spec Grav, UA: 1.025 (ref 1.010–1.025)
Urobilinogen, UA: 0.2 U/dL
pH, UA: 6 (ref 5.0–8.0)

## 2023-06-13 LAB — VITAMIN D 25 HYDROXY (VIT D DEFICIENCY, FRACTURES): VITD: 18.13 ng/mL — ABNORMAL LOW (ref 30.00–100.00)

## 2023-06-13 LAB — IBC + FERRITIN
Ferritin: 11.1 ng/mL (ref 10.0–291.0)
Iron: 119 ug/dL (ref 42–145)
Saturation Ratios: 26.8 % (ref 20.0–50.0)
TIBC: 443.8 ug/dL (ref 250.0–450.0)
Transferrin: 317 mg/dL (ref 212.0–360.0)

## 2023-06-13 LAB — HEMOGLOBIN A1C: Hgb A1c MFr Bld: 5.7 % (ref 4.6–6.5)

## 2023-06-13 MED ORDER — ESCITALOPRAM OXALATE 10 MG PO TABS: 10.0000 mg | ORAL_TABLET | Freq: Every day | ORAL | Status: DC

## 2023-06-13 NOTE — Assessment & Plan Note (Signed)
Restarting lexapro 10 mg

## 2023-06-13 NOTE — Assessment & Plan Note (Signed)
Bc ibc ferritin ordered pending results

## 2023-06-13 NOTE — Assessment & Plan Note (Signed)
Ordered vitamin d pending results.  Continue otc supplementation 

## 2023-06-13 NOTE — Assessment & Plan Note (Signed)
Pt advised to work on diet and exercise as tolerated  

## 2023-06-13 NOTE — Progress Notes (Unsigned)
Established Patient Office Visit  Subjective:   Patient ID: Katelyn Soto, female    DOB: [birth year]  Age: 43 y.o. MRN: [id number]  CC:  Chief Complaint  Patient presents with   Medical Management of Chronic Issues    Had been having issues with gas, took some gasX and it resolved. Pt would also like to have her urine checked due to urinary frequency.     HPI: Katelyn Soto is a 43 y.o. female presenting on 06/13/2023 for Medical Management of Chronic Issues (Had been having issues with gas, took some gasX and it resolved. Pt would also like to have her urine checked due to urinary frequency. )   Took some otc gas ex with some relief of her increased gas. Prior she had had a lot of pressure in her stomach area and just felt uncomfortable. Was not having any constipation or diarrhea. She did also get some otc prilosec as she does report heartburn, she did not yet start prilosec. She reports she quite frequently has heartburn. She states this has completely resolved. Denies ruq abdominal pain.   She does want to check for UTI and or kidney infection as she has had some increased urinary frequency. This has been going on for a few weeks. She does report some mid back pain, but not sure if this is out of the normal because she does have chronic back pain. When she urinates she sees some cloudy vaginal discharge, there is no vaginal or urinary odor.         ROS: Negative unless specifically indicated above in HPI.   Relevant past medical history reviewed and updated as indicated.   Allergies and medications reviewed and updated.   Current Outpatient Medications:    Cholecalciferol (VITAMIN D3) 50 MCG (2000 UT) capsule, Take 1 capsule (2,000 Units total) by mouth daily., Disp: 90 capsule, Rfl: 3   escitalopram (LEXAPRO) 10 MG tablet, Take 1 tablet (10 mg total) by mouth daily., Disp: , Rfl:    lidocaine (LIDODERM) 5 %, Place 1 patch onto the skin daily. Remove & Discard patch within 12  hours or as directed by MD, Disp: 30 patch, Rfl: 0   methimazole (TAPAZOLE) 10 MG tablet, Take 1 tablet (10 mg total) by mouth as directed. 1 tablet Monday through Thursday, half a tablet on Friday, Saturday, and Sunday, Disp: 72 tablet, Rfl: 3   tiZANidine (ZANAFLEX) 4 MG tablet, Take 1/2 to one tablet prn muscle spasm, Disp: 30 tablet, Rfl: 0  No Known Allergies  Objective:   BP 116/84 (BP Location: Right Arm, Patient Position: Sitting, Cuff Size: Normal)   Pulse 78   Temp 98.4 F (36.9 C) (Temporal)   Ht 5\' 4"  (1.626 m)   Wt 195 lb (88.5 kg)   SpO2 98%   BMI 33.47 kg/m    Physical Exam Constitutional:      General: She is not in acute distress.    Appearance: Normal appearance. She is normal weight. She is not ill-appearing, toxic-appearing or diaphoretic.  HENT:     Head: Normocephalic.  Cardiovascular:     Rate and Rhythm: Normal rate and regular rhythm.  Pulmonary:     Effort: Pulmonary effort is normal.  Musculoskeletal:        General: Normal range of motion.  Neurological:     General: No focal deficit present.     Mental Status: She is alert and oriented to person, place, and time. Mental status is at baseline.  Psychiatric:        Mood and Affect: Mood normal.        Behavior: Behavior normal.        Thought Content: Thought content normal.        Judgment: Judgment normal.     Assessment & Plan:  Urinary frequency Assessment & Plan: Poc urine negative  A1c ordered to r/o diabetes  Orders: -     POCT URINE DIPSTICK -     WET PREP BY MOLECULAR PROBE -     Urine cytology ancillary only  Anxiety and depression Assessment & Plan: Restarting lexapro 10 mg  Orders: -     Escitalopram Oxalate; Take 1 tablet (10 mg total) by mouth daily.  Iron deficiency anemia due to chronic blood loss Assessment & Plan: Bc ibc ferritin ordered pending results   Orders: -     CBC with Differential/Platelet -     IBC + Ferritin  Screening for lipoid disorders -      Lipid panel  Class 1 obesity due to excess calories without serious comorbidity with body mass index (BMI) of 31.0 to 31.9 in adult Assessment & Plan: Pt advised to work on diet and exercise as tolerated   Orders: -     Comprehensive metabolic panel -     Hemoglobin A1c  Vitamin D deficiency Assessment & Plan: Ordered vitamin d pending results.  Continue otc supplementation   Orders: -     VITAMIN D 25 Hydroxy (Vit-D Deficiency, Fractures)   Gas: has resolved. Suspect could have been food related, given printout for diet to watch out for increased gas. Advised if pain returns or with increased gas again get in for evaluation   Follow up plan: Return in about 3 months (around 09/11/2023) for f/u CPE.  Mort Sawyers, FNP

## 2023-06-13 NOTE — Assessment & Plan Note (Signed)
Poc urine negative  A1c ordered to r/o diabetes

## 2023-06-16 ENCOUNTER — Encounter: Payer: Self-pay | Admitting: Family

## 2023-06-16 LAB — URINE CYTOLOGY ANCILLARY ONLY
Chlamydia: NEGATIVE
Comment: NEGATIVE
Comment: NEGATIVE
Comment: NORMAL
Neisseria Gonorrhea: NEGATIVE
Trichomonas: NEGATIVE

## 2023-07-14 ENCOUNTER — Encounter: Payer: Self-pay | Admitting: Cardiology

## 2023-07-16 ENCOUNTER — Encounter: Payer: Self-pay | Admitting: Cardiology

## 2023-07-16 ENCOUNTER — Ambulatory Visit: Payer: Managed Care, Other (non HMO) | Attending: Cardiology | Admitting: Cardiology

## 2023-07-16 VITALS — BP 126/84 | HR 75 | Ht 64.0 in | Wt 192.8 lb

## 2023-07-16 DIAGNOSIS — E66811 Obesity, class 1: Secondary | ICD-10-CM

## 2023-07-16 DIAGNOSIS — R002 Palpitations: Secondary | ICD-10-CM | POA: Diagnosis not present

## 2023-07-16 DIAGNOSIS — Z6831 Body mass index (BMI) 31.0-31.9, adult: Secondary | ICD-10-CM

## 2023-07-16 DIAGNOSIS — E6609 Other obesity due to excess calories: Secondary | ICD-10-CM

## 2023-07-16 NOTE — Patient Instructions (Signed)
 Medication Instructions:  Your physician recommends that you continue on your current medications as directed. Please refer to the Current Medication list given to you today.  *If you need a refill on your cardiac medications before your next appointment, please call your pharmacy*   Lab Work: None Ordered If you have labs (blood work) drawn today and your tests are completely normal, you will receive your results only by: MyChart Message (if you have MyChart) OR A paper copy in the mail If you have any lab test that is abnormal or we need to change your treatment, we will call you to review the results.   Testing/Procedures: None Ordered   Follow-Up: At [facility], you and your health needs are our priority.  As part of our continuing mission to provide you with exceptional heart care, we have created designated Provider Care Teams.  These Care Teams include your primary Cardiologist (physician) and Advanced Practice Providers (APPs -  Physician Assistants and Nurse Practitioners) who all work together to provide you with the care you need, when you need it.  We recommend signing up for the patient portal called "MyChart".  Sign up information is provided on this After Visit Summary.  MyChart is used to connect with patients for Virtual Visits (Telemedicine).  Patients are able to view lab/test results, encounter notes, upcoming appointments, etc.  Non-urgent messages can be sent to your provider as well.   To learn more about what you can do with MyChart, go to [email or web address].    Your next appointment:   12 month follow up

## 2023-07-16 NOTE — Progress Notes (Signed)
Cardiology Office Note:    Date:  07/16/2023   ID:  Katelyn Soto, DOB 1980-11-09, MRN [id number]  PCP:  Mort Sawyers, FNP  Cardiologist:  Garwin Brothers, MD   Referring MD: Mort Sawyers, FNP    ASSESSMENT:    1. Palpitations   2. Class 1 obesity due to excess calories without serious comorbidity with body mass index (BMI) of 31.0 to 31.9 in adult    PLAN:    In order of problems listed above:  Primary prevention stressed with the patient.  Importance of compliance with diet medication stressed and patient verbalized standing. Palpitations: These have completely resolved and she is happy about it. Ex-smoker: She quit in December and promises never to get back to smoking. Obesity: Weight reduction stressed diet emphasized and she promises to do better.  She was told to at least walk half mL daily daily basis. Patient will be seen in follow-up appointment in 6 months or earlier if the patient has any concerns.    Medication Adjustments/Labs and Tests Ordered: Current medicines are reviewed at length with the patient today.  Concerns regarding medicines are outlined above.  Orders Placed This Encounter  Procedures   EKG 12-Lead   No orders of the defined types were placed in this encounter.    No chief complaint on file.    History of Present Illness:    Katelyn Soto is a 43 y.o. female.  Patient was evaluated by me for obesity.  She denies any problems at this time and takes care of activities of daily living.  No chest pain orthopnea or PND.  She tells me that she was smoking but quit in December and promises never to get back.  At the time of my evaluation, the patient is alert awake oriented and in no distress.  Past Medical History:  Diagnosis Date   Anxiety and depression 07/15/2022   Cardiac murmur 02/10/2020   Chronic neck pain 03/04/2023   Chronic pain of both shoulders 03/04/2023   Cigarette smoker 11/14/2017   Class 1 obesity due to excess calories  without serious comorbidity with body mass index (BMI) of 31.0 to 31.9 in adult 07/15/2022   Folate deficiency 07/15/2022   Graves disease 07/04/2020   Hyperthyroidism 03/10/2020   Iron deficiency anemia 01/14/2016   Urinary frequency 06/13/2023   Vitamin D deficiency 01/14/2016    Past Surgical History:  Procedure Laterality Date   ESSURE TUBAL LIGATION      Current Medications: Current Meds  Medication Sig   Cholecalciferol (VITAMIN D3) 50 MCG (2000 UT) capsule Take 1 capsule (2,000 Units total) by mouth daily.   escitalopram (LEXAPRO) 10 MG tablet Take 1 tablet (10 mg total) by mouth daily.   lidocaine (LIDODERM) 5 % Place 1 patch onto the skin daily. Remove & Discard patch within 12 hours or as directed by MD   methimazole (TAPAZOLE) 10 MG tablet Take 1 tablet (10 mg total) by mouth as directed. 1 tablet Monday through Thursday, half a tablet on Friday, Saturday, and Sunday   tiZANidine (ZANAFLEX) 4 MG tablet Take 1/2 to one tablet prn muscle spasm     Allergies:   Patient has no known allergies.   Social History   Socioeconomic History   Marital status: Significant Other    Spouse name: Not on file   Number of children: 6   Years of education: Not on file   Highest education level: Not on file  Occupational History   Occupation:  income maintenance case worker  Tobacco Use   Smoking status: Former    Current packs/day: 0.50    Average packs/day: 0.5 packs/day for 13.0 years (6.5 ttl pk-yrs)    Types: Cigarettes   Smokeless tobacco: Never  Vaping Use   Vaping status: Never Used  Substance and Sexual Activity   Alcohol use: Yes    Comment: socially , occasional   Drug use: No   Sexual activity: Yes    Partners: Male    Birth control/protection: Surgical  Other Topics Concern   Not on file  Social History Narrative   Not on file   Social Drivers of Health   Financial Resource Strain: Not on file  Food Insecurity: Not on file  Transportation Needs: Not on  file  Physical Activity: Not on file  Stress: Not on file  Social Connections: Not on file     Family History: The patient's family history includes Breast cancer in her paternal aunt; Cancer in her father; Diabetes in her mother; Heart disease in her mother; Heart failure in her mother; Hypertension in her mother.  ROS:   Please see the history of present illness.    All other systems reviewed and are negative.  EKGs/Labs/Other Studies Reviewed:    The following studies were reviewed today: .Marland KitchenEKG Interpretation Date/Time:  Wednesday July 16 2023 10:27:23 EST Ventricular Rate:  75 PR Interval:  130 QRS Duration:  80 QT Interval:  374 QTC Calculation: 417 R Axis:   42  Text Interpretation: Normal sinus rhythm Cannot rule out Anterior infarct , age undetermined When compared with ECG of 03-May-2022 17:34, PREVIOUS ECG IS PRESENT Confirmed by Belva Crome [id number]) on 07/16/2023 11:02:31 AM     Recent Labs: 02/10/2023: TSH 1.10 06/13/2023: ALT 9; BUN 13; Creatinine, Ser 0.81; Hemoglobin 12.5; Platelets 168.0; Potassium 4.2; Sodium 138  Recent Lipid Panel    Component Value Date/Time   CHOL 170 06/13/2023 1115   CHOL 166 02/19/2022 1459   TRIG 84.0 06/13/2023 1115   HDL 37.30 (L) 06/13/2023 1115   HDL 46 02/19/2022 1459   CHOLHDL 5 06/13/2023 1115   VLDL 16.8 06/13/2023 1115   LDLCALC 115 (H) 06/13/2023 1115   LDLCALC 97 02/19/2022 1459    Physical Exam:    VS:  BP 126/84   Pulse 75   Ht 5\' 4"  (1.626 m)   Wt 192 lb 12.8 oz (87.5 kg)   SpO2 93%   BMI 33.09 kg/m     Wt Readings from Last 3 Encounters:  07/16/23 192 lb 12.8 oz (87.5 kg)  06/13/23 195 lb (88.5 kg)  03/04/23 190 lb 12.8 oz (86.5 kg)     GEN: Patient is in no acute distress HEENT: Normal NECK: No JVD; No carotid bruits LYMPHATICS: No lymphadenopathy CARDIAC: Hear sounds regular, 2/6 systolic murmur at the apex. RESPIRATORY:  Clear to auscultation without rales, wheezing or rhonchi  ABDOMEN:  Soft, non-tender, non-distended MUSCULOSKELETAL:  No edema; No deformity  SKIN: Warm and dry NEUROLOGIC:  Alert and oriented x 3 PSYCHIATRIC:  Normal affect   Signed, Garwin Brothers, MD  07/16/2023 11:14 AM    [facility]

## 2023-08-04 ENCOUNTER — Ambulatory Visit: Payer: Medicaid Other | Admitting: Clinical

## 2023-08-04 DIAGNOSIS — F331 Major depressive disorder, recurrent, moderate: Secondary | ICD-10-CM

## 2023-08-04 DIAGNOSIS — F419 Anxiety disorder, unspecified: Secondary | ICD-10-CM | POA: Diagnosis not present

## 2023-08-04 NOTE — Progress Notes (Signed)
   Doree Barthel, LCSW

## 2023-08-04 NOTE — Progress Notes (Signed)
 [facility] Counselor/Therapist Progress Note  Patient ID: Katelyn Soto, MRN: [id number],    Date: 08/04/2023  Time Spent: 1:33pm - 2:02pm : 29 minutes    Treatment Type: Individual Therapy  Reported Symptoms: Patient reported fluctuations in mood  Mental Status Exam: Appearance:  Neat and Well Groomed     Behavior: Appropriate  Motor: Normal  Speech/Language:  Clear and Coherent and Normal Rate  Affect: Appropriate  Mood: normal  Thought process: normal  Thought content:   WNL  Sensory/Perceptual disturbances:   WNL  Orientation: oriented to person, place, and situation  Attention: Good  Concentration: Good  Memory: WNL  Fund of knowledge:  Good  Insight:   Good  Judgment:  Good  Impulse Control: Good   Risk Assessment: Danger to Self:  No Patient denied current suicidal ideation   Self-injurious Behavior: No Danger to Others: No Patient denied current homicidal ideation Duty to Warn:no Physical Aggression / Violence:No  Access to Firearms a concern: No  Gang Involvement:No   Subjective: Patient stated, "I wanted to follow up, I've had a lot of things going on, the guy I was dealing with we had a bad break". Patient stated, "it did have me in a different head space, I didn't want to be upset, I didn't want to be mad". Patient reported patient's relationship with patient's significant other ended in December 2024. Patient stated, "me and one of my children had a falling out and ended up moving out". Patient reported patient's son moved out in January 2025. Patient stated, "I hate to use the term selfish, but I'm selfish, I'm now to the point where I'm done trying to explain, trying to make people happy, things that are important to me put them to the side", "its all about my peace of mind", "if it doesn't give me peace it has to go".  Patient stated, "some days are better than others", "now I'm all about self care" in response to patient's current mood. Patient  stated, "I have been pulled in so many different directions to take care of so many other things" and stated, "that pulled me away from therapy". Patient stated, "doing things for everybody else and putting my own stuff behind" in response to barriers to participation in therapy. Patient stated, "I think we might need to revisit those" in reference to goals for therapy. Patient reported she works from home on Thursdays and Fridays and would prefer virtual sessions on Thursdays or Fridays.   Patient requested to end today's session early due to patient's supervisor scheduling a meeting today at 2:15pm today.   Interventions: Motivational Interviewing. Clinician conducted session via caregility video from clinician's home office. Patient provided verbal consent to proceed with telehealth session and is aware of limitations of telephone or video visits. Patient participated in session from patient's vehicle. Reviewed events since last session. Assessed patient's mood since last session and current mood.  Discussed patient's absence from therapy and explored patient's motivation for resuming therapy. Explored and identified barriers to attendance in therapy. Reviewed patient's goals for therapy and re-established goals for therapy.     Diagnosis:  Major depressive disorder, recurrent episode, moderate   Anxiety disorder, unspecified type     Plan: Patient is to utilize [employer] [facility], problem solving, solution focused, and coping strategies to decrease symptoms associated with their diagnoses. Frequency: bi-weekly  Modality: individual      Long-term goal:   Identify triggers for symptoms of depression and anxiety and develop/implement coping  strategies to utilize in response to reduce intensity and frequency of symptoms from 2 days/week to 0 days/week per patient report for at least 3 consecutive months. Target Date: 08/03/24  Progress: re-established on 08/04/23    Short-term goal:   Identify and verbally express contributing factors to patient's internal happiness Target Date: 02/03/25  Progress: re-established on 08/04/23    Identify and implement activities patient enjoys, such as, traveling and establishing an exercise routine Target Date: 02/03/25  Progress: re-established on 08/04/23    Develop and implement strategies to increase self care, such as, activities to support patient's physical and mental health Target Date: 02/03/25  Progress: re-established on 08/04/23                       Doree Barthel, LCSW

## 2023-08-11 NOTE — Progress Notes (Unsigned)
 Name: Katelyn Soto  MRN/ DOB: [id number], Jul 30, 1980    Age/ Sex: 43 y.o., female     PCP: Mort Sawyers, FNP   Reason for Endocrinology Evaluation: Hyperthyroidism     Initial Endocrinology Clinic Visit: 03/21/2020    PATIENT IDENTIFIER: Katelyn Soto is a 43 y.o., female with a past medical history of anemia and hyperthyroidism.  She has followed with [facility] since 03/21/2020 for consultative assistance with management of her hyperthyroidism  HISTORICAL SUMMARY:    She has been diagnosed with hyperthyroidism in 01/2020 with a suppressed TSH < 0.005 uIU/mL with an elevated T4 and FT3 at 7.77 ng/dL and 40.9 pg/mL respectively.    She had COVID infection 2 weeks prior to this. She was having fatigue, weight loss and palpitations, was having constipation and dysphagia.       Pt was started on Methimazole 03/14/2020 and Metoprolol.  TRAB elevated at 29.20 IU/L   No Fh of thyroid disease  SUBJECTIVE:    Today (08/12/2023):  Katelyn Soto is here for hyperthyroidism.   Weight stable  Denies local neck swelling  No palpitations  Denies constipation or diarrhea  Denies tremors  Has rare  itching and burning , last  ophthalmology exam 11/2022   HOME ENDOCRINE MEDICATIONS: Methimazole 10 mg, 1 tablet Monday through Thursday, half a tablet Friday, Saturday, and Sunday     HISTORY:  Past Medical History:  Past Medical History:  Diagnosis Date   Anxiety and depression 07/15/2022   Cardiac murmur 02/10/2020   Chronic neck pain 03/04/2023   Chronic pain of both shoulders 03/04/2023   Cigarette smoker 11/14/2017   Class 1 obesity due to excess calories without serious comorbidity with body mass index (BMI) of 31.0 to 31.9 in adult 07/15/2022   Folate deficiency 07/15/2022   Graves disease 07/04/2020   Hyperthyroidism 03/10/2020   Iron deficiency anemia 01/14/2016   Urinary frequency 06/13/2023   Vitamin D deficiency 01/14/2016   Past Surgical History:   Past Surgical History:  Procedure Laterality Date   ESSURE TUBAL LIGATION     Social History:  reports that she has quit smoking. Her smoking use included cigarettes. She has a 6.5 pack-year smoking history. She has never used smokeless tobacco. She reports current alcohol use. She reports that she does not use drugs. Family History:  Family History  Problem Relation Age of Onset   Hypertension Mother    Diabetes Mother    Heart disease Mother    Heart failure Mother    Cancer Father        throat   Breast cancer Paternal Aunt      HOME MEDICATIONS: Allergies as of 08/12/2023   No Known Allergies      Medication List        Accurate as of August 12, 2023  8:00 AM. If you have any questions, ask your nurse or doctor.          escitalopram 10 MG tablet Commonly known as: Lexapro Take 1 tablet (10 mg total) by mouth daily.   lidocaine 5 % Commonly known as: Lidoderm Place 1 patch onto the skin daily. Remove & Discard patch within 12 hours or as directed by MD   methimazole 10 MG tablet Commonly known as: TAPAZOLE Take 1 tablet (10 mg total) by mouth as directed. 1 tablet Monday through Thursday, half a tablet on Friday, Saturday, and Sunday   tiZANidine 4 MG tablet Commonly known as: Zanaflex Take 1/2 to  one tablet prn muscle spasm   Vitamin D3 50 MCG (2000 UT) capsule Take 1 capsule (2,000 Units total) by mouth daily.          OBJECTIVE:   PHYSICAL EXAM: VS: BP 110/80 (BP Location: Left Arm, Patient Position: Sitting, Cuff Size: Normal)   Pulse (!) 59   Resp 20   Ht 5\' 4"  (1.626 m)   Wt 190 lb 6.4 oz (86.4 kg)   SpO2 99%   BMI 32.68 kg/m     EXAM: General: Pt appears well and is in NAD Right eye proptosis noted  Neck: General: Supple without adenopathy. Thyroid: Thyroid gland is prominent.   No goiter or nodules appreciated.   Lungs: Clear with good BS bilat with no rales, rhonchi, or wheezes  Heart: Auscultation: RRR.  Abdomen:  Normoactive bowel sounds, soft, nontender, without masses or organomegaly palpable  Extremities:  BL LE: No pretibial edema normal ROM and strength.  Mental Status: Judgment, insight: Intact Orientation: Oriented to time, place, and person Mood and affect: No depression, anxiety, or agitation     DATA REVIEWED:   Latest Reference Range & Units 08/12/23 08:23  TSH mIU/L 3.57  T4,Free(Direct) 0.8 - 1.8 ng/dL 1.1       Latest Reference Range & Units 06/13/23 11:15  Sodium 135 - 145 mEq/L 138  Potassium 3.5 - 5.1 mEq/L 4.2  Chloride 96 - 112 mEq/L 106  CO2 19 - 32 mEq/L 27  Glucose 70 - 99 mg/dL 84  BUN 6 - 23 mg/dL 13  Creatinine 2.59 - 5.63 mg/dL 8.75  Calcium 8.4 - 64.3 mg/dL 9.1  Alkaline Phosphatase 39 - 117 U/L 42  Albumin 3.5 - 5.2 g/dL 4.2  AST 0 - 37 U/L 10  ALT 0 - 35 U/L 9  Total Protein 6.0 - 8.3 g/dL 7.1  Total Bilirubin 0.2 - 1.2 mg/dL 0.4  GFR >32.95 mL/min 88.23      TRAB Latest Ref Range: <=2.00 IU/L 29.20 (H)    Thyroid ultrasound 02/24/2020 Nonspecific thyroid heterogeneity compatible with medical thyroid disease. No other significant finding or nodule by ultrasound.  ASSESSMENT / PLAN / RECOMMENDATIONS:   Hyperthyroidism secondary to Graves' Disease  - Pt is clinically  euthyroid  - No local neck symptoms -TFTs normal, but her TSH is at the upper limit of normal and will decrease as below   Medications   Stop  methimazole 10 mg, 1 tablet Monday through Thursday, half a tablet Friday, Saturday, and Sunday START Methimazole 5 mg daily    2. Graves' disease:   - No extrathyroidal manifestations of Graves' disease     F/U in 6 months     Signed electronically by: Lyndle Herrlich, MD  [facility]  [facility] [address] [place] [place], [place] [address] Phone: [phone] FAX: [phone]      CC: Mort Sawyers, FNP [address] [place] [address] Phone: [phone]   Fax: [phone]   Return to Endocrinology clinic as below: Future Appointments  Date Time Provider Department Center  08/22/2023  1:30 PM Doree Barthel, LCSW [facility] None  09/17/2023  7:40 AM Mort Sawyers, FNP [facility]

## 2023-08-12 ENCOUNTER — Encounter: Payer: Self-pay | Admitting: Internal Medicine

## 2023-08-12 ENCOUNTER — Ambulatory Visit (INDEPENDENT_AMBULATORY_CARE_PROVIDER_SITE_OTHER): Payer: Managed Care, Other (non HMO) | Admitting: Internal Medicine

## 2023-08-12 VITALS — BP 110/80 | HR 59 | Resp 20 | Ht 64.0 in | Wt 190.4 lb

## 2023-08-12 DIAGNOSIS — E0590 Thyrotoxicosis, unspecified without thyrotoxic crisis or storm: Secondary | ICD-10-CM

## 2023-08-12 DIAGNOSIS — E0500 Thyrotoxicosis with diffuse goiter without thyrotoxic crisis or storm: Secondary | ICD-10-CM

## 2023-08-13 ENCOUNTER — Encounter: Payer: Self-pay | Admitting: Internal Medicine

## 2023-08-13 LAB — T4, FREE: Free T4: 1.1 ng/dL (ref 0.8–1.8)

## 2023-08-13 LAB — TSH: TSH: 3.57 m[IU]/L

## 2023-08-13 MED ORDER — METHIMAZOLE 5 MG PO TABS: 5.0000 mg | ORAL_TABLET | Freq: Every day | ORAL | 3 refills | Status: DC

## 2023-08-22 ENCOUNTER — Ambulatory Visit: Admitting: Clinical

## 2023-08-22 DIAGNOSIS — F331 Major depressive disorder, recurrent, moderate: Secondary | ICD-10-CM

## 2023-08-22 DIAGNOSIS — F419 Anxiety disorder, unspecified: Secondary | ICD-10-CM

## 2023-08-22 NOTE — Progress Notes (Signed)
   Doree Barthel, LCSW
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
## 2023-08-26 ENCOUNTER — Encounter: Payer: Self-pay | Admitting: Clinical

## 2023-09-06 ENCOUNTER — Other Ambulatory Visit: Payer: Self-pay | Admitting: Internal Medicine

## 2023-09-08 ENCOUNTER — Encounter: Admitting: Clinical

## 2023-09-08 NOTE — Progress Notes (Signed)
 This encounter was created in error - please disregard.

## 2023-09-08 NOTE — Progress Notes (Signed)
   Katelyn Barthel, LCSW

## 2023-09-17 ENCOUNTER — Encounter: Payer: Managed Care, Other (non HMO) | Admitting: Family

## 2023-09-24 ENCOUNTER — Encounter: Payer: Self-pay | Admitting: Family

## 2023-09-24 NOTE — Telephone Encounter (Signed)
 Disregard prior notation that I saw patient already this was an error.   Please call pt to see if she can get on schedule asap and triage for possible DVT.  For now please advise her to stay off of medications until we rule out other causes.

## 2023-09-24 NOTE — Telephone Encounter (Signed)
 Saw patient already, thank you for speaking with the patient.  Please see note for further information.

## 2023-09-25 ENCOUNTER — Ambulatory Visit (INDEPENDENT_AMBULATORY_CARE_PROVIDER_SITE_OTHER): Admitting: Family

## 2023-09-25 ENCOUNTER — Telehealth: Payer: Self-pay

## 2023-09-25 ENCOUNTER — Ambulatory Visit
Admission: RE | Admit: 2023-09-25 | Discharge: 2023-09-25 | Disposition: A | Discharge status: Home / Self Care | Source: Ambulatory Visit | Attending: Family | Admitting: Family

## 2023-09-25 ENCOUNTER — Other Ambulatory Visit: Payer: Self-pay | Admitting: Family

## 2023-09-25 ENCOUNTER — Encounter: Payer: Self-pay | Admitting: Family

## 2023-09-25 ENCOUNTER — Encounter: Admitting: Clinical

## 2023-09-25 VITALS — BP 130/86 | HR 63 | Temp 98.3°F | Ht 64.0 in | Wt 193.0 lb

## 2023-09-25 DIAGNOSIS — M5431 Sciatica, right side: Secondary | ICD-10-CM | POA: Insufficient documentation

## 2023-09-25 DIAGNOSIS — M79604 Pain in right leg: Secondary | ICD-10-CM | POA: Insufficient documentation

## 2023-09-25 MED ORDER — PREDNISONE 10 MG (21) PO TBPK: ORAL_TABLET | ORAL | 0 refills | Status: DC

## 2023-09-25 NOTE — Assessment & Plan Note (Signed)
 Pending results of u/s to r/o DVT  If negative I suspect sciatica  Rx prednisone  pack take as directed Take muscle relaxer prn  Lidocaine  prn heat to site.  Exercises discussed.

## 2023-09-25 NOTE — Telephone Encounter (Signed)
 Spoke to patient. This has been ongoing off and on for a few years. States she is having some swelling and pain with pressure. No redness, not tender to touch and no heat to area. Denies any injury or fall. I have set up for visit today in office. She will call if any other changes or questions.

## 2023-09-25 NOTE — Progress Notes (Signed)
 Established Patient Office Visit  Subjective:   Patient ID: Katelyn Soto, female    DOB: 06/04/1980  Age: 43 y.o. MRN: [id number]  CC:  Chief Complaint  Patient presents with   Leg Pain    HPI: Katelyn Soto is a 43 y.o. female presenting on 09/25/2023 for Leg Pain  Two days ago started with right lower extremity at the ball of her foot with pain. She takes the stair often while at work as a form of exercise, and she at first thought maybe she had stepped too hard on the stairs. She states in the next few hours she then wasn't able to walk much at all as putting weight on it caused more pain in the legs and it is a shooting type pain form the foot up. She at times will feel the pain go down the side of her leg as well. Sitting she is not in pain however if she moves around and or lays down she will feel the pain. Still can not put a lot of pressure on the right leg either without pain. No swelling or redness of the leg.        ROS: Negative unless specifically indicated above in HPI.   Relevant past medical history reviewed and updated as indicated.   Allergies and medications reviewed and updated.   Current Outpatient Medications:    Cholecalciferol (VITAMIN D3) 50 MCG (2000 UT) capsule, Take 1 capsule (2,000 Units total) by mouth daily., Disp: 90 capsule, Rfl: 3   escitalopram  (LEXAPRO ) 10 MG tablet, Take 1 tablet (10 mg total) by mouth daily., Disp: , Rfl:    lidocaine  (LIDODERM ) 5 %, Place 1 patch onto the skin daily. Remove & Discard patch within 12 hours or as directed by MD, Disp: 30 patch, Rfl: 0   methimazole  (TAPAZOLE ) 5 MG tablet, Take 1 tablet (5 mg total) by mouth daily., Disp: 90 tablet, Rfl: 3   tiZANidine  (ZANAFLEX ) 4 MG tablet, Take 1/2 to one tablet prn muscle spasm, Disp: 30 tablet, Rfl: 0  No Known Allergies  Objective:   BP 130/86 (BP Location: Left Arm, Patient Position: Sitting, Cuff Size: Normal)   Pulse 63   Temp 98.3 F (36.8 C) (Temporal)   Ht 5'  4" (1.626 m)   Wt 193 lb (87.5 kg)   SpO2 99%   BMI 33.13 kg/m    Physical Exam Constitutional:      General: She is not in acute distress.    Appearance: Normal appearance. She is normal weight. She is not ill-appearing, toxic-appearing or diaphoretic.  HENT:     Head: Normocephalic.  Cardiovascular:     Rate and Rhythm: Normal rate and regular rhythm.  Pulmonary:     Effort: Pulmonary effort is normal.     Breath sounds: Normal breath sounds.  Abdominal:     Tenderness: There is right CVA tenderness.  Musculoskeletal:        General: Normal range of motion.     Right lower leg: Tenderness (posterior knee, calf tenderness on palpation with positive homans sign) present. No swelling. No edema.  Neurological:     General: No focal deficit present.     Mental Status: She is alert and oriented to person, place, and time. Mental status is at baseline.  Psychiatric:        Mood and Affect: Mood normal.        Behavior: Behavior normal.        Thought Content: Thought  content normal.        Judgment: Judgment normal.     Assessment & Plan:  Acute pain of right lower extremity Assessment & Plan: DVT vs sciatica  Ordering venous doppler u/s to r/o dvt and or posterior bakers cyst as pt with positive homans sign however I do suspect sciatica Discussed ER precautions  Orders: -     US  Venous Img Lower Unilateral Right (DVT); Future  Right sided sciatica Assessment & Plan: Pending results of u/s to r/o DVT  If negative I suspect sciatica  Rx prednisone  pack take as directed Take muscle relaxer prn  Lidocaine  prn heat to site.  Exercises discussed.      Follow up plan: Return if symptoms worsen or fail to improve.  Felicita Horns, FNP

## 2023-09-25 NOTE — Progress Notes (Signed)
 This encounter was created in error - please disregard.

## 2023-09-25 NOTE — Assessment & Plan Note (Signed)
 DVT vs sciatica  Ordering venous doppler u/s to r/o dvt and or posterior bakers cyst as pt with positive homans sign however I do suspect sciatica Discussed ER precautions

## 2023-09-25 NOTE — Telephone Encounter (Signed)
 Received call from ultrasound below. Patient has been released and advised that our office will reach out with results.    Normal compressibility of the common femoral, superficial femoral, and popliteal veins, as well as the visualized calf veins. Visualized portions of profunda femoral vein and great saphenous vein unremarkable. No filling defects to suggest DVT on grayscale or color Doppler imaging. Doppler waveforms show normal direction of venous flow, normal respiratory plasticity and response to augmentation.

## 2023-09-25 NOTE — Telephone Encounter (Signed)
 NOTED

## 2023-09-25 NOTE — Progress Notes (Signed)
   Katelyn Barthel, LCSW

## 2023-09-25 NOTE — Telephone Encounter (Signed)
 Saw pt today.  Was not chronic, was acute. See note for summary.

## 2023-12-09 ENCOUNTER — Encounter: Admitting: Family

## 2024-02-10 ENCOUNTER — Ambulatory Visit: Payer: Self-pay | Admitting: Family

## 2024-02-10 ENCOUNTER — Ambulatory Visit: Admitting: Family

## 2024-02-10 ENCOUNTER — Encounter: Payer: Self-pay | Admitting: Family

## 2024-02-10 VITALS — BP 122/84 | HR 98 | Temp 98.7°F | Ht 64.0 in | Wt 196.0 lb

## 2024-02-10 DIAGNOSIS — Z0000 Encounter for general adult medical examination without abnormal findings: Secondary | ICD-10-CM

## 2024-02-10 DIAGNOSIS — D508 Other iron deficiency anemias: Secondary | ICD-10-CM | POA: Diagnosis not present

## 2024-02-10 DIAGNOSIS — F419 Anxiety disorder, unspecified: Secondary | ICD-10-CM | POA: Diagnosis not present

## 2024-02-10 DIAGNOSIS — Z1211 Encounter for screening for malignant neoplasm of colon: Secondary | ICD-10-CM

## 2024-02-10 DIAGNOSIS — Z720 Tobacco use: Secondary | ICD-10-CM

## 2024-02-10 DIAGNOSIS — E0590 Thyrotoxicosis, unspecified without thyrotoxic crisis or storm: Secondary | ICD-10-CM

## 2024-02-10 DIAGNOSIS — R7303 Prediabetes: Secondary | ICD-10-CM | POA: Diagnosis not present

## 2024-02-10 DIAGNOSIS — J301 Allergic rhinitis due to pollen: Secondary | ICD-10-CM

## 2024-02-10 DIAGNOSIS — Z23 Encounter for immunization: Secondary | ICD-10-CM | POA: Diagnosis not present

## 2024-02-10 DIAGNOSIS — Z1231 Encounter for screening mammogram for malignant neoplasm of breast: Secondary | ICD-10-CM

## 2024-02-10 DIAGNOSIS — E559 Vitamin D deficiency, unspecified: Secondary | ICD-10-CM | POA: Diagnosis not present

## 2024-02-10 DIAGNOSIS — E66811 Obesity, class 1: Secondary | ICD-10-CM

## 2024-02-10 DIAGNOSIS — E782 Mixed hyperlipidemia: Secondary | ICD-10-CM | POA: Diagnosis not present

## 2024-02-10 LAB — IBC + FERRITIN
Ferritin: 8.7 ng/mL — ABNORMAL LOW (ref 10.0–291.0)
Iron: 75 ug/dL (ref 42–145)
Saturation Ratios: 18.2 % — ABNORMAL LOW (ref 20.0–50.0)
TIBC: 413 ug/dL (ref 250.0–450.0)
Transferrin: 295 mg/dL (ref 212.0–360.0)

## 2024-02-10 LAB — BASIC METABOLIC PANEL WITH GFR
BUN: 9 mg/dL (ref 6–23)
CO2: 26 meq/L (ref 19–32)
Calcium: 9.1 mg/dL (ref 8.4–10.5)
Chloride: 104 meq/L (ref 96–112)
Creatinine, Ser: 0.84 mg/dL (ref 0.40–1.20)
GFR: 84.07 mL/min (ref >60.00)
Glucose, Bld: 104 mg/dL — ABNORMAL HIGH (ref 70–99)
Potassium: 4.2 meq/L (ref 3.5–5.1)
Sodium: 137 meq/L (ref 135–145)

## 2024-02-10 LAB — LIPID PANEL
Cholesterol: 146 mg/dL (ref 0–200)
HDL: 38.5 mg/dL — ABNORMAL LOW (ref >39.00)
LDL Cholesterol: 87 mg/dL (ref 0–99)
NonHDL: 107.19
Total CHOL/HDL Ratio: 4
Triglycerides: 101 mg/dL (ref 0.0–149.0)
VLDL: 20.2 mg/dL (ref 0.0–40.0)

## 2024-02-10 LAB — VITAMIN D 25 HYDROXY (VIT D DEFICIENCY, FRACTURES): VITD: 30.1 ng/mL (ref 30.00–100.00)

## 2024-02-10 LAB — CBC
HCT: 38.5 % (ref 36.0–46.0)
Hemoglobin: 12.4 g/dL (ref 12.0–15.0)
MCHC: 32.2 g/dL (ref 30.0–36.0)
MCV: 71.8 fl — ABNORMAL LOW (ref 78.0–100.0)
Platelets: 167 K/uL (ref 150.0–400.0)
RBC: 5.37 Mil/uL — ABNORMAL HIGH (ref 3.87–5.11)
RDW: 14.3 % (ref 11.5–15.5)
WBC: 5.6 K/uL (ref 4.0–10.5)

## 2024-02-10 LAB — HEMOGLOBIN A1C: Hgb A1c MFr Bld: 6.1 % (ref 4.6–6.5)

## 2024-02-10 LAB — TSH: TSH: 0.62 u[IU]/mL (ref 0.35–5.50)

## 2024-02-10 LAB — T4, FREE: Free T4: 0.86 ng/dL (ref 0.60–1.60)

## 2024-02-10 MED ORDER — COVID-19 MRNA VAC-TRIS(PFIZER) 30 MCG/0.3ML IM SUSY: 0.3000 mL | PREFILLED_SYRINGE | Freq: Once | INTRAMUSCULAR | 0 refills | Status: AC

## 2024-02-10 NOTE — Patient Instructions (Addendum)
  Add fiber supplement once daily.  Add a probiotic (such as Florastor) daily. Drink 64 oz of water a day. Eat lots of fresh fruit and veggies. Ensure regular exercise.    If you are not able to have regular BM's with the above regimen, you may add miralax 1 tablespoon daily.  Increase or decrease amount/frequency as needed to ensure 1 soft BM/day.   ------------------------------------   I have sent an electronic order over to your preferred location for the following:   []   2D Mammogram  [x]   3D Mammogram  []   Bone Density   Please give this center a call to get scheduled at your convenience.  [x]   [facility]  [address] [place] [address]  [phone]  Make sure to wear two piece  clothing  No lotions powders or deodorants the day of the appointment Make sure to bring picture ID and insurance card.  Bring list of medications you are currently taking including any supplements.

## 2024-02-10 NOTE — Progress Notes (Signed)
 Subjective:  Patient ID: Katelyn Soto, female    DOB: 04/21/1981  Age: 43 y.o. MRN: [id number]  Patient Care Team: Corwin Antu, FNP as PCP - General (Family Medicine)   CC:  Chief Complaint  Patient presents with   Annual Exam    HPI Katelyn Soto is a 43 y.o. female who presents today for an annual physical exam. She reports consuming a general diet. The patient does not participate in regular exercise at present. She generally feels well. She reports sleeping well. She does not have additional problems to discuss today.   Vision:Within last year Dental:Receives regular dental care   Mammogram: 08/13/22 Last pap: 2024 negative with obgyn [facility]  Colonoscopy:ordering  Pt is without acute concerns.   Discussed the use of AI scribe software for clinical note transcription with the patient, who gave verbal consent to proceed.  History of Present Illness Katelyn Soto is a 43 year old female who presents for an annual physical exam.  She experiences constipation, which she believes is due to not drinking enough water. She feels a sensation of heaviness and pressure in the stomach area, which began yesterday morning and persisted throughout the day. Bowel movements occur but are incomplete, requiring multiple attempts for relief. The bowel movements are not hard or painful.  She has a history of thyroid  issues and is currently taking methimazole  5 mg daily. She notes that her thyroid  issues often correlate with constipation. Her last TSH level was 3.57, and she has an upcoming appointment with her endocrinologist on Thursday.  She is not currently taking Lexapro , although it was effective for her anxiety. She discontinued it due to its impact on her sex drive. Her anxiety is currently manageable without medication.  She has a history of high cholesterol and takes vitamin D  supplements. She is a smoker and works for [employer], which involves interaction with the public.  She reports  a history of allergies, experiencing symptoms such as nasal congestion and watery eyes.    Advanced Directives Patient does not have advanced directives   DEPRESSION SCREENING    02/10/2024    8:10 AM 09/25/2023   12:30 PM 03/04/2023    8:49 AM 09/02/2022    8:04 AM 07/15/2022   12:19 PM 02/19/2022    3:08 PM 02/09/2020    9:05 AM  PHQ 2/9 Scores  PHQ - 2 Score 1 0 0 2 5 4 6   PHQ- 9 Score 5 0 1  15 11 21      ROS: Negative unless specifically indicated above in HPI.    Current Outpatient Medications:    Cholecalciferol (VITAMIN D3) 50 MCG (2000 UT) capsule, Take 1 capsule (2,000 Units total) by mouth daily., Disp: 90 capsule, Rfl: 3   COVID-19 mRNA vaccine, Pfizer, (COMIRNATY) syringe, Inject 0.3 mLs into the muscle once for 1 dose., Disp: 0.3 mL, Rfl: 0   escitalopram  (LEXAPRO ) 10 MG tablet, Take 1 tablet (10 mg total) by mouth daily., Disp: , Rfl:    lidocaine  (LIDODERM ) 5 %, Place 1 patch onto the skin daily. Remove & Discard patch within 12 hours or as directed by MD, Disp: 30 patch, Rfl: 0   methimazole  (TAPAZOLE ) 5 MG tablet, Take 1 tablet (5 mg total) by mouth daily., Disp: 90 tablet, Rfl: 3   tiZANidine  (ZANAFLEX ) 4 MG tablet, Take 1/2 to one tablet prn muscle spasm, Disp: 30 tablet, Rfl: 0    Objective:    BP 122/84 (BP Location: Left Arm,  Patient Position: Sitting, Cuff Size: Normal)   Pulse 98   Temp 98.7 F (37.1 C) (Temporal)   Ht 5' 4 (1.626 m)   Wt 196 lb (88.9 kg)   SpO2 (!) 81%   BMI 33.64 kg/m   BP Readings from Last 3 Encounters:  02/10/24 122/84  09/25/23 130/86  08/12/23 110/80      Physical Exam Vitals reviewed.  Constitutional:      General: She is not in acute distress.    Appearance: Normal appearance. She is obese. She is not ill-appearing.  HENT:     Head: Normocephalic.     Right Ear: Tympanic membrane normal.     Left Ear: Tympanic membrane normal.     Nose: Nose normal.     Right Turbinates: Swollen.     Left Turbinates: Swollen.      Mouth/Throat:     Mouth: Mucous membranes are moist.     Pharynx: Posterior oropharyngeal erythema and postnasal drip present.  Eyes:     Extraocular Movements: Extraocular movements intact.     Pupils: Pupils are equal, round, and reactive to light.  Cardiovascular:     Rate and Rhythm: Normal rate and regular rhythm.  Pulmonary:     Effort: Pulmonary effort is normal.     Breath sounds: Normal breath sounds.  Abdominal:     General: Abdomen is flat. Bowel sounds are normal.     Palpations: Abdomen is soft.     Tenderness: There is no guarding or rebound.  Musculoskeletal:        General: Normal range of motion.     Cervical back: Normal range of motion.  Skin:    General: Skin is warm.     Capillary Refill: Capillary refill takes less than 2 seconds.  Neurological:     General: No focal deficit present.     Mental Status: She is alert.  Psychiatric:        Mood and Affect: Mood normal.        Behavior: Behavior normal.        Thought Content: Thought content normal.        Judgment: Judgment normal.       Results LABS   TSH: 3.57      Assessment & Plan:   Assessment and Plan Assessment & Plan Adult Wellness Visit Routine adult wellness visit. She is a current smoker, increasing her risk for pneumonia and other health issues. - Perform routine labs including TSH, cholesterol, and vitamin D  levels - Provide a worksheet for cholesterol management - Discuss GI referral for colonoscopy - Discuss Pap smear status - Discuss eye and dental exam status - Discuss pneumonia vaccination due to increased risk from smoking - Discuss COVID-19 vaccination due to increased risk from smoking -Patient Counseling(The following topics were reviewed):  Preventative care handout given to pt  Health maintenance and immunizations reviewed. Please refer to Health maintenance section. Pt advised on safe sex, wearing seatbelts in car, and proper nutrition labwork ordered today for  annual Dental health: Discussed importance of regular tooth brushing, flossing, and dental visits. Constipation  Intermittent constipation with abdominal pressure, likely related to low water intake and possibly thyroid  function. No significant straining or hard stools reported. - Recommend increasing water intake - Suggest taking Miralax daily until regular bowel movements are achieved - Consider adding a fiber supplement - Advise adding a stool softener if stools become hard  Allergic rhinitis with nasal polyp Allergic rhinitis with nasal polyp causing  nasal obstruction and watery eyes, likely exacerbated by allergies. - Recommend Flonase  for nasal inflammation - Suggest Zyrtec, Allegra, Claritin, or Xyzal for itchy, watery eyes  Hyperlipidemia Elevated cholesterol levels noted in previous labs. She is unsure how to manage cholesterol levels. - Provide a worksheet for cholesterol management - Repeat cholesterol labs  Vitamin D  deficiency Previously low vitamin D  levels. Currently taking vitamin D3 50 mcg daily, but 50,000 IU weekly prescription was not sent. - Repeat vitamin D  levels - Send prescription for vitamin D  50,000 IU weekly if needed  Anxiety disorder Anxiety is well-managed without Lexapro  due to side effects on libido. Discussed alternative options if anxiety worsens. - Discuss potential use of Wellbutrin  as an adjunct to Lexapro  if anxiety worsens and libido issues persist  Recording duration: 15 minutes Smoking cessation instruction/counseling given:  counseled patient on the dangers of tobacco use, advised patient to stop smoking, and reviewed strategies to maximize success         Follow-up: Return in about 1 year (around 02/09/2025) for f/u CPE.   Ginger Patrick, FNP

## 2024-02-11 ENCOUNTER — Telehealth: Payer: Self-pay

## 2024-02-11 ENCOUNTER — Other Ambulatory Visit: Payer: Self-pay

## 2024-02-11 DIAGNOSIS — Z1211 Encounter for screening for malignant neoplasm of colon: Secondary | ICD-10-CM

## 2024-02-11 NOTE — Telephone Encounter (Signed)
 Gastroenterology Pre-Procedure Review  Request Date: 05/07/24 Requesting Physician: Dr. Melany  PATIENT REVIEW QUESTIONS: The patient responded to the following health history questions as indicated:    1. Are you having any GI issues? no 2. Do you have a personal history of Polyps? no 3. Do you have a family history of Colon Cancer or Polyps? no 4. Diabetes Mellitus? no 5. Joint replacements in the past 12 months?no 6. Major health problems in the past 3 months?no 7. Any artificial heart valves, MVP, or defibrillator?no 8. Cardiac history? Yes Heart Murmur.  Clearance sent to [facility].    MEDICATIONS & ALLERGIES:    Patient reports the following regarding taking any anticoagulation/antiplatelet therapy:   Plavix, Coumadin, Eliquis, Xarelto, Lovenox, Pradaxa, Brilinta, or Effient? no Aspirin? no  Patient confirms/reports the following medications:  Current Outpatient Medications  Medication Sig Dispense Refill   Cholecalciferol (VITAMIN D3) 50 MCG (2000 UT) capsule Take 1 capsule (2,000 Units total) by mouth daily. 90 capsule 3   escitalopram  (LEXAPRO ) 10 MG tablet Take 1 tablet (10 mg total) by mouth daily.     lidocaine  (LIDODERM ) 5 % Place 1 patch onto the skin daily. Remove & Discard patch within 12 hours or as directed by MD 30 patch 0   methimazole  (TAPAZOLE ) 5 MG tablet Take 1 tablet (5 mg total) by mouth daily. 90 tablet 3   tiZANidine  (ZANAFLEX ) 4 MG tablet Take 1/2 to one tablet prn muscle spasm 30 tablet 0   No current facility-administered medications for this visit.    Patient confirms/reports the following allergies:  No Known Allergies  No orders of the defined types were placed in this encounter.   AUTHORIZATION INFORMATION Primary Insurance: 1D#: Group #:  Secondary Insurance: 1D#: Group #:  SCHEDULE INFORMATION: Date: 05/07/24 Time: Location: [facility]

## 2024-02-11 NOTE — Telephone Encounter (Signed)
   Name: Katelyn Soto  DOB: 06/19/1980  MRN: [id number]  Primary Cardiologist: None  Chart reviewed as part of pre-operative protocol coverage. Because of Katelyn Soto's past medical history and time since last visit, she will require a follow-up in-office visit in order to better assess preoperative cardiovascular risk.  Pre-op covering staff: - Please schedule appointment and call patient to inform them. If patient already had an upcoming appointment within acceptable timeframe, please add pre-op clearance to the appointment notes so provider is aware. - Please contact requesting surgeon's office via preferred method (i.e, phone, fax) to inform them of need for appointment prior to surgery.  Emalynn Clewis E Bryanda Mikel, NP  02/11/2024, 2:50 PM

## 2024-02-11 NOTE — Telephone Encounter (Signed)
   Pre-operative Risk Assessment    Patient Name: Katelyn Soto  DOB: 1980/10/10 MRN: [id number]   Date of last office visit: 07/16/23 Date of next office visit: none   Request for Surgical Clearance    Procedure:  Colonoscopy  Date of Surgery:  Clearance 05/07/24                                 Surgeon:  Dr.Scholl Surgeon's Group or Practice Name:  [facility] Phone number:  [phone] Fax number:  [phone]   Type of Clearance Requested:   - Medical    Type of Anesthesia:  General    Additional requests/questions:  N/A  Bonney Merlynn LITTIE Lang   02/11/2024, 2:42 PM

## 2024-02-12 ENCOUNTER — Encounter: Payer: Self-pay | Admitting: Internal Medicine

## 2024-02-12 ENCOUNTER — Ambulatory Visit (INDEPENDENT_AMBULATORY_CARE_PROVIDER_SITE_OTHER): Admitting: Internal Medicine

## 2024-02-12 VITALS — BP 120/76 | HR 95 | Ht 64.0 in | Wt 194.0 lb

## 2024-02-12 DIAGNOSIS — E0500 Thyrotoxicosis with diffuse goiter without thyrotoxic crisis or storm: Secondary | ICD-10-CM

## 2024-02-12 DIAGNOSIS — E0590 Thyrotoxicosis, unspecified without thyrotoxic crisis or storm: Secondary | ICD-10-CM

## 2024-02-12 MED ORDER — METHIMAZOLE 5 MG PO TABS: 5.0000 mg | ORAL_TABLET | Freq: Every day | ORAL | 3 refills | Status: AC

## 2024-02-12 NOTE — Progress Notes (Signed)
 Name: Katelyn Soto  MRN/ DOB: [id number], 03-14-1981    Age/ Sex: 43 y.o., female     PCP: Corwin Antu, FNP   Reason for Endocrinology Evaluation: Hyperthyroidism     Initial Endocrinology Clinic Visit: 03/21/2020    PATIENT IDENTIFIER: Katelyn Soto is a 43 y.o., female with a past medical history of anemia and hyperthyroidism.  She has followed with [facility] since 03/21/2020 for consultative assistance with management of her hyperthyroidism  HISTORICAL SUMMARY:    She has been diagnosed with hyperthyroidism in 01/2020 with a suppressed TSH < 0.005 uIU/mL with an elevated T4 and FT3 at 7.77 ng/dL and 77.1 pg/mL respectively.    She had COVID infection 2 weeks prior to this. She was having fatigue, weight loss and palpitations, was having constipation and dysphagia.       Pt was started on Methimazole  03/14/2020 and Metoprolol .  TRAB elevated at 29.20 IU/L   No Fh of thyroid  disease  SUBJECTIVE:    Today (02/12/2024):  [name] is here for hyperthyroidism.    Weight has been fluctuating No local neck swelling  No palpitations  No tremors  Has constipation but no diarrhea  Has noted right eye watering and itching , last eye exam last year , on zyrtec anf flonase    HOME ENDOCRINE MEDICATIONS: Methimazole  5 mg, 1 tablet daily      HISTORY:  Past Medical History:  Past Medical History:  Diagnosis Date   Anxiety and depression 07/15/2022   Cardiac murmur 02/10/2020   Chronic neck pain 03/04/2023   Chronic pain of both shoulders 03/04/2023   Cigarette smoker 11/14/2017   Class 1 obesity due to excess calories without serious comorbidity with body mass index (BMI) of 31.0 to 31.9 in adult 07/15/2022   Folate deficiency 07/15/2022   Graves disease 07/04/2020   Hyperthyroidism 03/10/2020   Iron deficiency anemia 01/14/2016   Urinary frequency 06/13/2023   Vitamin D  deficiency 01/14/2016   Past Surgical History:  Past Surgical History:   Procedure Laterality Date   ESSURE TUBAL LIGATION     Social History:  reports that she has quit smoking. Her smoking use included cigarettes. She has a 6.5 pack-year smoking history. She has never used smokeless tobacco. She reports current alcohol use. She reports that she does not use drugs. Family History:  Family History  Problem Relation Age of Onset   Hypertension Mother    Diabetes Mother    Heart disease Mother    Heart failure Mother    Cancer Father        throat   Breast cancer Paternal Aunt      HOME MEDICATIONS: Allergies as of 02/12/2024   No Known Allergies      Medication List        Accurate as of February 12, 2024  7:49 AM. If you have any questions, ask your nurse or doctor.          escitalopram  10 MG tablet Commonly known as: Lexapro  Take 1 tablet (10 mg total) by mouth daily.   lidocaine  5 % Commonly known as: Lidoderm  Place 1 patch onto the skin daily. Remove & Discard patch within 12 hours or as directed by MD   methimazole  5 MG tablet Commonly known as: TAPAZOLE  Take 1 tablet (5 mg total) by mouth daily.   tiZANidine  4 MG tablet Commonly known as: Zanaflex  Take 1/2 to one tablet prn muscle spasm   Vitamin D3 50 MCG (2000 UT)  capsule Take 1 capsule (2,000 Units total) by mouth daily.          OBJECTIVE:   PHYSICAL EXAM: VS: BP 120/76 (BP Location: Left Arm, Patient Position: Sitting, Cuff Size: Normal)   Pulse 95   Ht 5' 4 (1.626 m)   Wt 194 lb (88 kg)   SpO2 97%   BMI 33.30 kg/m     EXAM: General: Pt appears well and is in NAD Right eye proptosis noted  Neck: General: Supple without adenopathy. Thyroid : Thyroid  gland is prominent.   No goiter or nodules appreciated.   Lungs: Clear with good BS bilat   Heart: Auscultation: RRR.  Abdomen: soft, nontender  Extremities:  BL LE: No pretibial edema   Mental Status: Judgment, insight: Intact Orientation: Oriented to time, place, and person Mood and affect: No  depression, anxiety, or agitation     DATA REVIEWED:   Latest Reference Range & Units 02/10/24 08:42  TSH 0.35 - 5.50 uIU/mL 0.62  T4,Free(Direct) 0.60 - 1.60 ng/dL 9.13    TRAB Latest Ref Range: <=2.00 IU/L 29.20 (H)    Thyroid  ultrasound 02/24/2020 Nonspecific thyroid  heterogeneity compatible with medical thyroid  disease. No other significant finding or nodule by ultrasound.  ASSESSMENT / PLAN / RECOMMENDATIONS:   Hyperthyroidism secondary to Graves' Disease  -Patient is clinically euthyroid -No local neck symptoms -Recent TFTs through PCPs office have been normal, no change  Medications   Continue methimazole  5 mg, 1 tablet daily    2. Graves' disease:   - Patient with watering of the right eye recently.  She was advised to use artificial teardrops and to follow-up with ophthalmology     F/U in 6 months     Signed electronically by: Stefano Redgie Butts, MD  [facility] Endocrinology  [facility] [address] [place], [place] [address] Phone: [phone] FAX: [phone]      CC: Corwin Antu, FNP [address] [place] [address] Phone: [phone]  Fax: [phone]   Return to Endocrinology clinic as below: No future appointments.

## 2024-02-12 NOTE — Telephone Encounter (Signed)
 Tried contacting patient to schedule IN OFFICE visit no answer left a detailed to call back and schedule

## 2024-02-13 NOTE — Telephone Encounter (Signed)
 Pt scheduled for OV on 04/26/24.

## 2024-02-13 NOTE — Telephone Encounter (Signed)
 2nd attempt : 02/13/24 Called patient to contact our office for cardiac clearance, NA, left message to schedule office visit.

## 2024-02-18 ENCOUNTER — Other Ambulatory Visit: Payer: Self-pay | Admitting: Family

## 2024-02-18 DIAGNOSIS — Z1231 Encounter for screening mammogram for malignant neoplasm of breast: Secondary | ICD-10-CM

## 2024-02-20 ENCOUNTER — Ambulatory Visit
Admission: RE | Admit: 2024-02-20 | Discharge: 2024-02-20 | Disposition: A | Discharge status: Home / Self Care | Source: Ambulatory Visit

## 2024-02-20 DIAGNOSIS — Z1231 Encounter for screening mammogram for malignant neoplasm of breast: Secondary | ICD-10-CM

## 2024-02-25 ENCOUNTER — Ambulatory Visit: Payer: Self-pay | Admitting: Family

## 2024-04-06 ENCOUNTER — Telehealth (HOSPITAL_BASED_OUTPATIENT_CLINIC_OR_DEPARTMENT_OTHER): Payer: Self-pay

## 2024-04-06 NOTE — Telephone Encounter (Signed)
 I reached out to surgery scheduler Rosaline Cruel by secure chat to please confirm date of procedure. I see 12/1 and 05/07/24.

## 2024-04-06 NOTE — Telephone Encounter (Signed)
 I will update the preop APP procedure date is 05/07/24. Pt has appt 04/26/24 Katelyn Soto, PAC.

## 2024-04-06 NOTE — Telephone Encounter (Signed)
   Name: Katelyn Soto  DOB: Jul 01, 1980  MRN: [id number]  Primary Cardiologist: None  Chart reviewed as part of pre-operative protocol coverage. Because of Shauniece S Eder's past medical history and time since last visit, she will require a follow-up in-office visit in order to better assess preoperative cardiovascular risk.  Patient has an office visit scheduled on 04/26/2024 with Glendia Ferrier, PA-C. Appointment notes have been updated to reflect need for pre-op evaluation.   Pre-op covering staff:  - Please contact requesting surgeon's office via preferred method (i.e, phone, fax) to inform them of need for appointment prior to surgery.   Barnie Hila, NP  04/06/2024, 1:18 PM

## 2024-04-06 NOTE — Telephone Encounter (Signed)
   Pre-operative Risk Assessment    Patient Name: Katelyn Soto  DOB: 04-04-1981 MRN: [id number]   Date of last office visit: 07/16/2023 - Dr. Elfredia Date of next office visit: 04/26/2024 - Glendia Ferrier, PA-C    Request for Surgical Clearance    Procedure:  colonoscopy   Date of Surgery:  Clearance 04/26/24                                 Surgeon:  Dr. Melany Surgeon's Group or Practice Name:  [facility]  Phone number:  [phone] Fax number:  [phone]   Type of Clearance Requested:   - Medical    Type of Anesthesia:  General    Additional requests/questions:  N/A  Bonney Patrcia Hong L   04/06/2024, 9:16 AM

## 2024-04-06 NOTE — Telephone Encounter (Signed)
 It appears as though the patient has an in-office visit scheduled with Glendia the day of her colonoscopy. Can we try to reschedule her in-office visit to some point prior to the date of her colonoscopy to obtain clearance at that time? If so, please change the note on the day of the appointment to reflect preoperative clearance so that this can be addressed.   If no appointments are available, she would need an televisit prior to her procedure, and to also have her in-office appointment rescheduled.   I have verified that she is not on any antiplatelet/anticoagulant therapies, and that she lives in the state of [place].

## 2024-04-06 NOTE — Telephone Encounter (Signed)
 Pt has appt with Glendia Ferrier, [facility] 04/26/24. Procedure 05/07/24. Per preop APP defer to [facility] appt 04/26/24.

## 2024-04-09 NOTE — Telephone Encounter (Signed)
 Pt has appt with Glendia Ferrier, [facility] 04/26/24. Procedure 05/07/24. Per preop APP defer to [facility] appt 04/26/24

## 2024-04-25 NOTE — Progress Notes (Deleted)
    OFFICE NOTE:    Date:  04/25/2024  ID:  TAIMA RADA, DOB 1981-01-27, MRN [id number] PCP: Corwin Antu, FNP  [facility] Providers Cardiologist:  None { Click to update primary MD,subspecialty MD or APP then REFRESH:1}       Echocardiogram (03/03/2020): Ejection Fraction 60-65%, no regional wall motion abnormalities, normal right ventricular systolic function Hyperlipidemia Iron deficiency anemia Hyperthyroidism secondary to Graves' disease Obesity Anxiety Depression        Discussed the use of AI scribe software for clinical note transcription with the patient, who gave verbal consent to proceed. History of Present Illness Katelyn Soto is a 43 y.o. female for surgical clearance. She is scheduled for a colonoscopy on May 07, 2024, under general anesthesia.    ROS-See HPI***    Studies Reviewed:       LABS Potassium: 4.2 (02/10/2024) Creatinine: 0.84 (02/10/2024) GFR: 84.07 (02/10/2024) ALT: 9 (06/13/2023) Total Cholesterol: 146 (02/10/2024) HDL: 38.5 (02/10/2024) LDL: 87 (02/10/2024) Triglycerides: 101 (02/10/2024) Hemoglobin: 12.4 (02/10/2024) Platelet Count: 167,000 (02/10/2024) TSH: 0.62 (02/10/2024)  Results  Risk Assessment/Calculations: {Does this patient have ATRIAL FIBRILLATION?:[phone]} No BP recorded.  {Refresh Note OR Click here to enter BP  :1}***      Physical Exam:  VS:  There were no vitals taken for this visit.       Wt Readings from Last 3 Encounters:  02/12/24 194 lb (88 kg)  02/10/24 196 lb (88.9 kg)  09/25/23 193 lb (87.5 kg)    Physical Exam***     Assessment and Plan:    Assessment & Plan Preoperative cardiovascular examination {Select to add RCRI Risk (<1%=LOW; >/=1%=HIGH) (Optional):21036017}  {Select if HIGH (RCRI >/=1%) Risk (Optional):21036030} Recommendations: {2014 ACC/AHA Perioperative Guidelines  :21036001} Antiplatelet and/or Anticoagulation Recommendations: {Antiplatelet Recommendations                   :21036016} {Anticoagulation Recommendations           :78963980}  Assessment and Plan Assessment & Plan    {      :1}    {Are you ordering a CV Procedure (e.g. stress test, cath, DCCV, TEE, etc)?   Press F2        :789639268}  Dispo:  No follow-ups on file.  Signed, Glendia Ferrier, PA-C

## 2024-04-26 ENCOUNTER — Ambulatory Visit: Attending: Physician Assistant | Admitting: Physician Assistant

## 2024-04-26 DIAGNOSIS — Z01810 Encounter for preprocedural cardiovascular examination: Secondary | ICD-10-CM

## 2024-04-26 NOTE — Telephone Encounter (Signed)
 Pt did not show for appt today. Glendia Ferrier, PA-C    04/26/2024 1:33 PM

## 2024-04-26 NOTE — Telephone Encounter (Signed)
 I will update the requesting office the pt did not show for her appt with Glendia Ferrier, [facility] for preop clearance.   Pt will need to cal back and reschedule in office appt for preop clearance.

## 2024-04-27 NOTE — Telephone Encounter (Signed)
 Katelyn Soto, our team will be sure to let you know when pt reschedules.

## 2024-04-29 ENCOUNTER — Encounter: Payer: Self-pay | Admitting: Family

## 2024-04-30 ENCOUNTER — Ambulatory Visit: Payer: Self-pay

## 2024-04-30 NOTE — Telephone Encounter (Signed)
 FYI Only or Action Required?: FYI only for provider: appointment scheduled on 05/03/2024.  Patient was last seen in primary care on 02/10/2024 by Corwin Antu, FNP.  Called Nurse Triage reporting Sore Throat.  Symptoms began yesterday.  Interventions attempted: OTC medications: advil .  Symptoms are: gradually improving.  Triage Disposition: Home Care  Patient/caregiver understands and will follow disposition?: No, wishes to speak with PCP  Copied from CRM #[id number]. Topic: Clinical - Red Word Triage >> Apr 30, 2024 10:01 AM Berneda FALCON wrote: Red Word that prompted transfer to Nurse Triage: Patient states PCP directed her to come in to have her lymph node checked. States there has been ongoing muscle pains for 2 months now. The pain is located under her right armpit down to the breast. Additionally, she has pain in the right side of her neck as of 04/29/24. Reason for Disposition  [1] Sore throat with cough/cold symptoms AND [2] present < 5 days  Answer Assessment - Initial Assessment Questions Also reports right sided axillary pain  1. ONSET: When did the throat start hurting? (Hours or days ago)      One day ago 2. SEVERITY: How bad is the sore throat? (Scale 1-10; mild, moderate or severe)     5/10 this morning, took ibuprofen  and no longer has throat pain 3. STREP EXPOSURE: Has there been any exposure to strep within the past week? If Yes, ask: What type of contact occurred?      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  VIRAL SYMPTOMS: Are there any symptoms of a cold, such as a runny nose, cough, hoarse voice or red eyes?      Sneezing, runny nose every once in a while 5. FEVER: Do you have a fever? If Yes, ask: What is your temperature, how was it measured, and when did it start?     No measured fever 6. PUS ON THE TONSILS: Is there pus on the tonsils in the back of your throat?     denies 7. OTHER SYMPTOMS: Do you have any other symptoms?  (e.g., difficulty breathing, headache, rash)    Body aches earlier this week, sneezing  Protocols used: Sore Throat-A-AH

## 2024-04-30 NOTE — Telephone Encounter (Signed)
 NOTED

## 2024-05-03 ENCOUNTER — Ambulatory Visit: Admitting: Family

## 2024-05-03 VITALS — BP 112/76 | HR 68 | Temp 98.0°F | Wt 192.0 lb

## 2024-05-03 DIAGNOSIS — J029 Acute pharyngitis, unspecified: Secondary | ICD-10-CM

## 2024-05-03 DIAGNOSIS — H6691 Otitis media, unspecified, right ear: Secondary | ICD-10-CM | POA: Insufficient documentation

## 2024-05-03 DIAGNOSIS — R590 Localized enlarged lymph nodes: Secondary | ICD-10-CM | POA: Diagnosis not present

## 2024-05-03 DIAGNOSIS — Z20822 Contact with and (suspected) exposure to covid-19: Secondary | ICD-10-CM | POA: Diagnosis not present

## 2024-05-03 LAB — POCT RAPID STREP A (OFFICE): Rapid Strep A Screen: NEGATIVE

## 2024-05-03 LAB — POC COVID19 BINAXNOW: SARS Coronavirus 2 Ag: NEGATIVE

## 2024-05-03 MED ORDER — AMOXICILLIN-POT CLAVULANATE 875-125 MG PO TABS: 1.0000 | ORAL_TABLET | Freq: Two times a day (BID) | ORAL | 0 refills | Status: AC

## 2024-05-03 NOTE — Progress Notes (Unsigned)
 Established Patient Office Visit  Subjective:      CC:  Chief Complaint  Patient presents with   Acute Visit    Reports sore throat, congestion, sneezing and fatigue. Denies cough, fever, body aches.    HPI: Katelyn Soto is a 43 y.o. female presenting on 05/03/2024 for Acute Visit (Reports sore throat, congestion, sneezing and fatigue. Denies cough, fever, body aches.) .  Discussed the use of AI scribe software for clinical note transcription with the patient, who gave verbal consent to proceed.  History of Present Illness Katelyn Soto is a 43 year old female who presents with right-sided lymphadenopathy and ear pain.  She initially noticed right-sided lymph node enlargement after receiving a pneumonia shot around September 16th. Approximately a week later, she began experiencing a dull, achy pain under her right armpit extending to the breast level. A mammogram conducted in September returned normal results, and the pain subsided after the test. The pain is intermittent and sometimes worsens with sudden neck movements. Ibuprofen  has been effective in alleviating the pain. No noticeable swelling in the area is reported.  In addition to the lymphadenopathy, she feels generally unwell with symptoms including a sore throat, congestion, and body aches that began last Tuesday. No cough, sneezing, or fever. She does not experience pressure in her face or ear pain, and there is no pressure in the throat. She has a history of allergies.  No significant changes in her symptoms since she began. No ear pain on the left side.         Social history:  Relevant past medical, surgical, family and social history reviewed and updated as indicated. Interim medical history since our last visit reviewed.  Allergies and medications reviewed and updated.  DATA REVIEWED: CHART IN EPIC     ROS: Negative unless specifically indicated above in HPI.    Current Outpatient Medications:     amoxicillin -clavulanate (AUGMENTIN ) 875-125 MG tablet, Take 1 tablet by mouth 2 (two) times daily., Disp: 20 tablet, Rfl: 0   Cholecalciferol (VITAMIN D3) 50 MCG (2000 UT) capsule, Take 1 capsule (2,000 Units total) by mouth daily., Disp: 90 capsule, Rfl: 3   escitalopram  (LEXAPRO ) 10 MG tablet, Take 1 tablet (10 mg total) by mouth daily., Disp: , Rfl:    lidocaine  (LIDODERM ) 5 %, Place 1 patch onto the skin daily. Remove & Discard patch within 12 hours or as directed by MD, Disp: 30 patch, Rfl: 0   methimazole  (TAPAZOLE ) 5 MG tablet, Take 1 tablet (5 mg total) by mouth daily., Disp: 90 tablet, Rfl: 3   tiZANidine  (ZANAFLEX ) 4 MG tablet, Take 1/2 to one tablet prn muscle spasm, Disp: 30 tablet, Rfl: 0        Objective:        BP 112/76 (BP Location: Left Arm, Patient Position: Sitting, Cuff Size: Large)   Pulse 68   Temp 98 F (36.7 C) (Temporal)   Wt 192 lb (87.1 kg)   SpO2 98%   BMI 32.96 kg/m   Physical Exam HEENT: Right ear with erythema and fluid behind the eardrum. Right nasal turbinate swollen. NECK: Right axillary lymphadenopathy.  Wt Readings from Last 3 Encounters:  05/03/24 192 lb (87.1 kg)  02/12/24 194 lb (88 kg)  02/10/24 196 lb (88.9 kg)    Physical Exam HENT:     Right Ear: A middle ear effusion is present.     Nose:     Right Turbinates: Swollen.     Left  Turbinates: Swollen.     Right Sinus: No maxillary sinus tenderness or frontal sinus tenderness.     Left Sinus: No maxillary sinus tenderness or frontal sinus tenderness.     Mouth/Throat:     Pharynx: No posterior oropharyngeal erythema or postnasal drip.  Lymphadenopathy:     Upper Body:     Right upper body: Axillary adenopathy present.          Results RADIOLOGY Mammogram: Normal (01/2024)  Assessment & Plan:   Assessment and Plan Assessment & Plan Right otitis media Acute right otitis media with fluid behind the eardrum and redness, likely contributing to right axillary  lymphadenopathy due to drainage and lymph node involvement. - Prescribed Augmentin  for one week to address the ear infection and potential lymph node involvement. Take antibiotic as prescribed. Increase oral fluids. Pt to f/u if sx worsen and or fail to improve in 2-3 days.    Right axillary lymphadenopathy Likely secondary to the right ear infection, with no significant tenderness or palpable abnormalities. Symptoms have been intermittent and responsive to ibuprofen . - Monitor symptoms for one to two weeks. - If symptoms persist, will order an ultrasound of the axillary region.  Allergic rhinitis with nasal polyp Chronic allergic rhinitis with significant nasal polyp formation, particularly on the right side, causing nasal obstruction. She has not been using Flonase  due to cost concerns. - Prescribed Flonase  nasal spray, two sprays each side of the nose. - Sent prescription to pharmacy and checked for Medicaid coverage.        Return if symptoms worsen or fail to improve.     Ginger Patrick, MSN, APRN, FNP-C [facility]

## 2024-05-07 ENCOUNTER — Ambulatory Visit: Admit: 2024-05-07 | Admitting: Gastroenterology

## 2024-05-07 SURGERY — COLONOSCOPY
Anesthesia: General

## 2024-05-25 ENCOUNTER — Other Ambulatory Visit: Payer: Self-pay | Admitting: Family

## 2024-05-25 DIAGNOSIS — F419 Anxiety disorder, unspecified: Secondary | ICD-10-CM

## 2024-05-25 MED ORDER — ESCITALOPRAM OXALATE 10 MG PO TABS: 10.0000 mg | ORAL_TABLET | Freq: Every day | ORAL | 1 refills | Status: AC

## 2024-05-25 NOTE — Telephone Encounter (Signed)
 Copied from CRM [id number]. Topic: Clinical - Medication Refill >> May 25, 2024  1:40 PM Burnard DEL wrote: Medication: escitalopram  (LEXAPRO ) 10 MG tablet  Has the patient contacted their pharmacy? Yes (Agent: If no, request that the patient contact the pharmacy for the refill. If patient does not wish to contact the pharmacy document the reason why and proceed with request.) (Agent: If yes, when and what did the pharmacy advise?)  This is the patient's preferred pharmacy:  [id number] [address] [facility] - [address] [place] [address] Phone: [phone] Fax: [phone]  Is this the correct pharmacy for this prescription? Yes If no, delete pharmacy and type the correct one.   Has the prescription been filled recently? No  Is the patient out of the medication? Yes  Has the patient been seen for an appointment in the last year OR does the patient have an upcoming appointment? Yes  Can we respond through MyChart? Yes  Agent: Please be advised that Rx refills may take up to 3 business days. We ask that you follow-up with your pharmacy.

## 2024-08-11 ENCOUNTER — Ambulatory Visit: Admitting: Internal Medicine
